# Patient Record
Sex: Male | Born: 1960 | Hispanic: Yes | Marital: Single | Smoking: Never smoker
Health system: Southern US, Community
[De-identification: ages and names within clinical notes are randomized; demographics above are authoritative.]

## PROBLEM LIST (undated history)

## (undated) DIAGNOSIS — I2510 Atherosclerotic heart disease of native coronary artery without angina pectoris: Secondary | ICD-10-CM

## (undated) DIAGNOSIS — E785 Hyperlipidemia, unspecified: Secondary | ICD-10-CM

## (undated) DIAGNOSIS — E119 Type 2 diabetes mellitus without complications: Secondary | ICD-10-CM

## (undated) DIAGNOSIS — I214 Non-ST elevation (NSTEMI) myocardial infarction: Secondary | ICD-10-CM

## (undated) DIAGNOSIS — I429 Cardiomyopathy, unspecified: Secondary | ICD-10-CM

## (undated) HISTORY — PX: FINGER SURGERY: SHX640

---

## 2015-04-11 ENCOUNTER — Encounter (HOSPITAL_COMMUNITY): Payer: Self-pay | Admitting: Emergency Medicine

## 2015-04-11 ENCOUNTER — Emergency Department (HOSPITAL_COMMUNITY)
Admission: EM | Admit: 2015-04-11 | Discharge: 2015-04-12 | Disposition: A | Discharge status: Home / Self Care | Payer: 59 | Attending: Emergency Medicine | Admitting: Emergency Medicine

## 2015-04-11 DIAGNOSIS — Y998 Other external cause status: Secondary | ICD-10-CM | POA: Insufficient documentation

## 2015-04-11 DIAGNOSIS — Y9389 Activity, other specified: Secondary | ICD-10-CM | POA: Insufficient documentation

## 2015-04-11 DIAGNOSIS — Y9289 Other specified places as the place of occurrence of the external cause: Secondary | ICD-10-CM | POA: Insufficient documentation

## 2015-04-11 DIAGNOSIS — S99922A Unspecified injury of left foot, initial encounter: Secondary | ICD-10-CM | POA: Insufficient documentation

## 2015-04-11 DIAGNOSIS — M5416 Radiculopathy, lumbar region: Secondary | ICD-10-CM | POA: Insufficient documentation

## 2015-04-11 DIAGNOSIS — W231XXA Caught, crushed, jammed, or pinched between stationary objects, initial encounter: Secondary | ICD-10-CM | POA: Insufficient documentation

## 2015-04-11 NOTE — ED Notes (Signed)
Pt states that he has had L foot pain and numbness x 3 weeks. Before that he reports that he strained his lower back lifting heavy boxes. Alert and oriented.

## 2015-04-12 ENCOUNTER — Emergency Department (HOSPITAL_COMMUNITY): Payer: 59

## 2015-04-12 MED ORDER — TRAMADOL HCL 50 MG PO TABS: 50.0000 mg | ORAL_TABLET | Freq: Four times a day (QID) | ORAL | Status: DC | PRN

## 2015-04-12 MED ORDER — HYDROCODONE-ACETAMINOPHEN 5-325 MG PO TABS
1.0000 | ORAL_TABLET | Freq: Once | ORAL | Status: AC
  Administered 2015-04-12: 1 via ORAL
  Filled 2015-04-12: qty 1

## 2015-04-12 MED ORDER — PREDNISONE 20 MG PO TABS: 20.0000 mg | ORAL_TABLET | Freq: Two times a day (BID) | ORAL | Status: DC

## 2015-04-12 NOTE — ED Notes (Signed)
Patient transported to X-ray 

## 2015-04-12 NOTE — Discharge Instructions (Signed)
Radiculopata lumbosacra (Lumbosacral Radiculopathy) La radiculopata lumbosacra es una afeccin que compromete los nervios raqudeos, y las races nerviosas de la parte baja de la espalda y Research scientist (life sciences). La afeccin se manifiesta cuando estos nervios y las races nerviosas se desplazan o se inflaman y causan sntomas. CAUSAS Este trastorno puede ser causado por:  Presin en un disco que sobresale de su lugar (hernia de disco). Un disco es una placa de cartlago que separa los huesos de la columna.  Degeneracin discal.  Un estrechamiento de los huesos de la parte baja de la espalda (estenosis del conducto raqudeo).  Un tumor.  Una infeccin.  Una lesin que ejerce una presin repentina en los discos que amortiguan los huesos de la parte ms baja de la columna. FACTORES DE RIESGO Es ms probable que esta afeccin se manifieste en:  Los hombres que [employer] 1 y [id number].  Las mujeres que tienen entre 35 y [id number].  Las personas que levantan cargas de forma inadecuada.  Las personas con sobrepeso o que llevan un estilo de vida sedentario.  Los fumadores.  Las personas que realizan actividades repetitivas que tensionan la columna vertebral. SNTOMAS Los sntomas de esta afeccin incluyen lo siguiente:  Dolor que baja por la espalda hasta las piernas (citica). Este es el sntoma ms frecuente. El dolor puede empeorar al sentarse, toser o estornudar.  Dolor y Science writer los brazos y la piernas.  Debilidad muscular.  Hormigueo.  Prdida del control del intestino o de la vejiga. DIAGNSTICO Esta afeccin se diagnostica mediante un examen fsico y la historia clnica. Si el dolor es prolongado, pueden hacerle estudios, por ejemplo:  Health visitor.  Radiografas.  Tomografa computarizada.  Mielografa.  Estudio de [employer]. TRATAMIENTO Esta afeccin suele tratarse con lo siguiente:  Aplicacin de compresas calientes y fras en las zonas  afectadas.  Estiramientos para mejorar la flexibilidad.  Ejercicios para fortalecer los msculos de la espalda.  Fisioterapia.  Analgsicos.  Una inyeccin de corticoides en la columna vertebral. En algunos los casos, no se necesita tratamiento. Si la afeccin es de larga duracin (crnica) o si los sntomas son intensos, el tratamiento puede incluir ciruga o cambios en el estilo de vida, como seguir un plan para bajar de peso. Sherrill los medicamentos solamente como se lo haya indicado el mdico.  No conduzca ni opere maquinaria pesada mientras toma analgsicos. Cuidado de la lesin  Aplique una compresa caliente en la zona lesionada como le haya indicado el mdico.  Aplique hielo en la zona afectada:  Ponga el hielo en una bolsa plstica.  Coloque una toalla entre la piel y la bolsa de hielo.  Deje el hielo durante 20 a 2minutos, cada 2horas, mientras est despierto o cuando lo necesite. O bien, djelo durante todo el [employer] le haya indicado el mdico. Otras indicaciones  Si le explicaron cmo hacer ejercicios o estiramientos, hgalos como se lo haya indicado el mdico.  Si el mdico le recomend una dieta o un programa de ejercicios, cmplalos como se lo hayan indicado.  Concurra a todas las visitas de control como se lo haya indicado el mdico. Esto es importante. SOLICITE ATENCIN MDICA SI:  El dolor no mejora con el tiempo, incluso si est tomando analgsicos. SOLICITE ATENCIN MDICA DE INMEDIATO SI:  Siente un dolor intenso.  El dolor empeora repentinamente.  Aumenta la debilidad en las piernas.  Pierde la capacidad de controlar la vejiga o el intestino.  Tiene dificultad para caminar o mantener el equilibrio.  Tiene fiebre.   Esta informacin no tiene Marine scientist el consejo del mdico. Asegrese de hacerle al mdico cualquier pregunta que tenga.   Document Released: 01/02/2005  Document Revised: 08/09/2014 Elsevier Interactive Patient Education [employer].

## 2015-04-12 NOTE — ED Notes (Signed)
Discharge instructions, follow up care, and rx x2 reviewed with patient. Patient verbalized understanding. 

## 2015-04-12 NOTE — ED Provider Notes (Addendum)
CSN: [id number]     Arrival date & time 04/11/15  2145 History  By signing my name below, I, Altamease Oiler, attest that this documentation has been prepared under the direction and in the presence of Tanna Furry, MD. Electronically Signed: Altamease Oiler, ED Scribe. 04/12/2015. 2:05 AM   Chief Complaint  Patient presents with  . Foot Pain    The history is provided by the patient. No language interpreter was used.   Kurt Mathis is a 55 y.o. male who presents to the Emergency Department complaining of constant numbness and burning pain at the medial aspect of the left foot with onset 3 weeks ago after he slipped and caught himself with the left leg suspended in air. 1 week prior to that incident he injured his back lifting boxes. The 7/10 in severity foot pain is worse with bearing weight but he denies weakness in the LLE. The back pain is improved with wearing a back brace. Tylenol provided insufficient pain relief at home.  Pt denies new right leg pain, incontinence of bladder, and difficulty urinating.   History reviewed. No pertinent past medical history. No past surgical history on file. History reviewed. No pertinent family history. Social History  Substance Use Topics  . Smoking status: None  . Smokeless tobacco: None  . Alcohol Use: None    Review of Systems  Constitutional: Negative for fever, chills, diaphoresis, appetite change and fatigue.  HENT: Negative for mouth sores, sore throat and trouble swallowing.   Eyes: Negative for visual disturbance.  Respiratory: Negative for cough, chest tightness, shortness of breath and wheezing.   Cardiovascular: Negative for chest pain.  Gastrointestinal: Negative for nausea, vomiting, abdominal pain, diarrhea and abdominal distention.  Endocrine: Negative for polydipsia, polyphagia and polyuria.  Genitourinary: Negative for dysuria, frequency, hematuria and difficulty urinating.  Musculoskeletal: Positive for back pain.  Negative for gait problem.       Left foot pain  Skin: Negative for color change, pallor and rash.  Neurological: Positive for numbness (left foot). Negative for dizziness, syncope, light-headedness and headaches.  Hematological: Does not bruise/bleed easily.  Psychiatric/Behavioral: Negative for behavioral problems and confusion.      Allergies  Review of patient's allergies indicates no known allergies.  Home Medications   Prior to Admission medications   Medication Sig Start Date End Date Taking? Authorizing Provider  predniSONE (DELTASONE) 20 MG tablet Take 1 tablet (20 mg total) by mouth 2 (two) times daily with a meal. 04/12/15   Tanna Furry, MD  traMADol (ULTRAM) 50 MG tablet Take 1 tablet (50 mg total) by mouth every 6 (six) hours as needed. 04/12/15   Tanna Furry, MD   BP 180/94 mmHg  Pulse 105  Temp(Src) 97.8 F (36.6 C) (Oral)  Resp 18  SpO2 94% Physical Exam  Constitutional: He is oriented to person, place, and time. He appears well-developed and well-nourished. No distress.  HENT:  Head: Normocephalic.  Eyes: Conjunctivae are normal. Pupils are equal, round, and reactive to light. No scleral icterus.  Neck: Normal range of motion. Neck supple. No thyromegaly present.  Cardiovascular: Normal rate and regular rhythm.  Exam reveals no gallop and no friction rub.   No murmur heard. Pulmonary/Chest: Effort normal and breath sounds normal. No respiratory distress. He has no wheezes. He has no rales.  Abdominal: Soft. Bowel sounds are normal. He exhibits no distension. There is no tenderness. There is no rebound.  Musculoskeletal: Normal range of motion.  Neurological: He is alert and oriented to  person, place, and time.  Decreased sesnation medial left foot. Normal reflexes. Describes pain in an L4 distribution.  Decreased sensation on exam. Normal strength.   Skin: Skin is warm and dry. No rash noted.  Psychiatric: He has a normal mood and affect. His behavior is normal.     ED Course  Procedures (including critical care time) DIAGNOSTIC STUDIES: Oxygen Saturation is 94% on RA,  normal by my interpretation.    COORDINATION OF CARE: 12:44 AM Discussed treatment plan with pt at bedside and pt agreed to plan.  Labs Review Labs Reviewed - No data to display  Imaging Review Dg Lumbar Spine Complete  04/12/2015  CLINICAL DATA:  Subacute onset of lower back pain. Pain radiates down the left leg and foot. Initial encounter. EXAM: LUMBAR SPINE - COMPLETE 4+ VIEW COMPARISON:  None. FINDINGS: There is no evidence of fracture or subluxation. Vertebral bodies demonstrate normal height and alignment. Intervertebral disc spaces are preserved. The visualized neural foramina are grossly unremarkable in appearance. The visualized bowel gas pattern is unremarkable in appearance; air and stool are noted within the colon. The sacroiliac joints are within normal limits. IMPRESSION: No evidence of fracture or subluxation along the lumbar spine. If the patient's symptoms persist, MRI could be considered to assess for underlying disc abnormality. Electronically Signed   By: Garald Balding M.D.   On: 04/12/2015 03:07   Dg Foot Complete Left  04/12/2015  CLINICAL DATA:  Subacute onset of lower back pain. Pain radiates down the left leg. Initial encounter. EXAM: LEFT FOOT - COMPLETE 3+ VIEW COMPARISON:  None. FINDINGS: There is no evidence of fracture or dislocation. The joint spaces are preserved. There is no evidence of talar subluxation; the subtalar joint is unremarkable in appearance. Small plantar and posterior calcaneal spurs are seen. No significant soft tissue abnormalities are seen. IMPRESSION: No evidence of fracture or dislocation. Electronically Signed   By: Garald Balding M.D.   On: 04/12/2015 03:06     EKG Interpretation None      MDM   Final diagnoses:  Lumbar radiculopathy    Patient with subjective pain and tingling. However no loss of function. He is able to  heel stand, toe stand, walk with high knees. Normal DTRs. No bowel or bladder changes. Normal lumbar spine film without compression. No foot fracture noted. Plan steroids, pain medication, primary care follow-up if not improving. Return precautions discussed.   I personally performed the services described in this documentation, which was scribed in my presence. The recorded information has been reviewed and is accurate.   Tanna Furry, MD 04/12/15 0335  Tanna Furry, MD 04/12/15 [phone]

## 2015-10-18 ENCOUNTER — Other Ambulatory Visit: Payer: Self-pay | Admitting: Specialist

## 2015-10-18 DIAGNOSIS — M549 Dorsalgia, unspecified: Secondary | ICD-10-CM

## 2015-11-06 ENCOUNTER — Inpatient Hospital Stay: Admission: RE | Admit: 2015-11-06 | Payer: Self-pay | Source: Ambulatory Visit

## 2015-11-20 ENCOUNTER — Ambulatory Visit
Admission: RE | Admit: 2015-11-20 | Discharge: 2015-11-20 | Disposition: A | Discharge status: Home / Self Care | Payer: Self-pay | Source: Ambulatory Visit | Attending: Specialist | Admitting: Specialist

## 2015-11-20 DIAGNOSIS — M549 Dorsalgia, unspecified: Secondary | ICD-10-CM

## 2015-11-20 MED ORDER — GADOBENATE DIMEGLUMINE 529 MG/ML IV SOLN
17.0000 mL | Freq: Once | INTRAVENOUS | Status: AC | PRN
  Administered 2015-11-20: 17 mL via INTRAVENOUS

## 2015-12-23 ENCOUNTER — Inpatient Hospital Stay (HOSPITAL_COMMUNITY)
Admission: EM | Admit: 2015-12-23 | Discharge: 2016-01-05 | DRG: 233 | Disposition: A | Discharge status: Home / Self Care | Payer: Self-pay | Attending: Thoracic Surgery (Cardiothoracic Vascular Surgery) | Admitting: Thoracic Surgery (Cardiothoracic Vascular Surgery)

## 2015-12-23 ENCOUNTER — Emergency Department (HOSPITAL_COMMUNITY): Payer: Self-pay

## 2015-12-23 ENCOUNTER — Encounter (HOSPITAL_COMMUNITY): Payer: Self-pay | Admitting: Emergency Medicine

## 2015-12-23 DIAGNOSIS — G8929 Other chronic pain: Secondary | ICD-10-CM | POA: Diagnosis present

## 2015-12-23 DIAGNOSIS — E118 Type 2 diabetes mellitus with unspecified complications: Secondary | ICD-10-CM

## 2015-12-23 DIAGNOSIS — Z951 Presence of aortocoronary bypass graft: Secondary | ICD-10-CM

## 2015-12-23 DIAGNOSIS — I10 Essential (primary) hypertension: Secondary | ICD-10-CM

## 2015-12-23 DIAGNOSIS — J9811 Atelectasis: Secondary | ICD-10-CM | POA: Diagnosis not present

## 2015-12-23 DIAGNOSIS — I319 Disease of pericardium, unspecified: Secondary | ICD-10-CM | POA: Diagnosis present

## 2015-12-23 DIAGNOSIS — Z833 Family history of diabetes mellitus: Secondary | ICD-10-CM

## 2015-12-23 DIAGNOSIS — E1165 Type 2 diabetes mellitus with hyperglycemia: Secondary | ICD-10-CM | POA: Insufficient documentation

## 2015-12-23 DIAGNOSIS — F419 Anxiety disorder, unspecified: Secondary | ICD-10-CM | POA: Diagnosis present

## 2015-12-23 DIAGNOSIS — I25110 Atherosclerotic heart disease of native coronary artery with unstable angina pectoris: Secondary | ICD-10-CM | POA: Diagnosis present

## 2015-12-23 DIAGNOSIS — I5041 Acute combined systolic (congestive) and diastolic (congestive) heart failure: Secondary | ICD-10-CM | POA: Diagnosis present

## 2015-12-23 DIAGNOSIS — I110 Hypertensive heart disease with heart failure: Secondary | ICD-10-CM | POA: Diagnosis present

## 2015-12-23 DIAGNOSIS — IMO0002 Reserved for concepts with insufficient information to code with codable children: Secondary | ICD-10-CM | POA: Diagnosis present

## 2015-12-23 DIAGNOSIS — D62 Acute posthemorrhagic anemia: Secondary | ICD-10-CM | POA: Diagnosis not present

## 2015-12-23 DIAGNOSIS — I2510 Atherosclerotic heart disease of native coronary artery without angina pectoris: Secondary | ICD-10-CM

## 2015-12-23 DIAGNOSIS — E119 Type 2 diabetes mellitus without complications: Secondary | ICD-10-CM | POA: Insufficient documentation

## 2015-12-23 DIAGNOSIS — I252 Old myocardial infarction: Secondary | ICD-10-CM

## 2015-12-23 DIAGNOSIS — Z7722 Contact with and (suspected) exposure to environmental tobacco smoke (acute) (chronic): Secondary | ICD-10-CM | POA: Diagnosis present

## 2015-12-23 DIAGNOSIS — I214 Non-ST elevation (NSTEMI) myocardial infarction: Principal | ICD-10-CM | POA: Diagnosis present

## 2015-12-23 DIAGNOSIS — R0601 Orthopnea: Secondary | ICD-10-CM

## 2015-12-23 DIAGNOSIS — R1310 Dysphagia, unspecified: Secondary | ICD-10-CM | POA: Diagnosis present

## 2015-12-23 DIAGNOSIS — I255 Ischemic cardiomyopathy: Secondary | ICD-10-CM

## 2015-12-23 DIAGNOSIS — E785 Hyperlipidemia, unspecified: Secondary | ICD-10-CM

## 2015-12-23 DIAGNOSIS — M5126 Other intervertebral disc displacement, lumbar region: Secondary | ICD-10-CM | POA: Diagnosis present

## 2015-12-23 HISTORY — DX: Atherosclerotic heart disease of native coronary artery without angina pectoris: I25.10

## 2015-12-23 HISTORY — DX: Type 2 diabetes mellitus without complications: E11.9

## 2015-12-23 HISTORY — DX: Cardiomyopathy, unspecified: I42.9

## 2015-12-23 HISTORY — DX: Non-ST elevation (NSTEMI) myocardial infarction: I21.4

## 2015-12-23 HISTORY — DX: Hyperlipidemia, unspecified: E78.5

## 2015-12-23 LAB — CBC
HCT: 44.5 % (ref 39.0–52.0)
HEMOGLOBIN: 15.4 g/dL (ref 13.0–17.0)
MCH: 30.6 pg (ref 26.0–34.0)
MCHC: 34.6 g/dL (ref 30.0–36.0)
MCV: 88.5 fL (ref 78.0–100.0)
PLATELETS: 288 10*3/uL (ref 150–400)
RBC: 5.03 MIL/uL (ref 4.22–5.81)
RDW: 12.9 % (ref 11.5–15.5)
WBC: 7.8 10*3/uL (ref 4.0–10.5)

## 2015-12-23 LAB — COMPREHENSIVE METABOLIC PANEL WITH GFR
ALT: 40 U/L (ref 17–63)
AST: 48 U/L — ABNORMAL HIGH (ref 15–41)
Albumin: 3.9 g/dL (ref 3.5–5.0)
Alkaline Phosphatase: 74 U/L (ref 38–126)
Anion gap: 10 (ref 5–15)
BUN: 13 mg/dL (ref 6–20)
CO2: 25 mmol/L (ref 22–32)
Calcium: 8.8 mg/dL — ABNORMAL LOW (ref 8.9–10.3)
Chloride: 103 mmol/L (ref 101–111)
Creatinine, Ser: 0.64 mg/dL (ref 0.61–1.24)
GFR calc Af Amer: >60 mL/min
GFR calc non Af Amer: >60 mL/min
Glucose, Bld: 290 mg/dL — ABNORMAL HIGH (ref 65–99)
Potassium: 3.9 mmol/L (ref 3.5–5.1)
Sodium: 138 mmol/L (ref 135–145)
Total Bilirubin: 0.7 mg/dL (ref 0.3–1.2)
Total Protein: 7.2 g/dL (ref 6.5–8.1)

## 2015-12-23 LAB — BASIC METABOLIC PANEL
ANION GAP: 9 (ref 5–15)
BUN: 13 mg/dL (ref 6–20)
CALCIUM: 9.3 mg/dL (ref 8.9–10.3)
CO2: 26 mmol/L (ref 22–32)
CREATININE: 0.74 mg/dL (ref 0.61–1.24)
Chloride: 101 mmol/L (ref 101–111)
GFR calc non Af Amer: >60 mL/min (ref >60)
Glucose, Bld: 309 mg/dL — ABNORMAL HIGH (ref 65–99)
Potassium: 4 mmol/L (ref 3.5–5.1)
SODIUM: 136 mmol/L (ref 135–145)

## 2015-12-23 LAB — I-STAT TROPONIN, ED: TROPONIN I, POC: 6.24 ng/mL — AB (ref 0.00–0.08)

## 2015-12-23 LAB — GLUCOSE, CAPILLARY
Glucose-Capillary: 200 mg/dL — ABNORMAL HIGH (ref 65–99)
Glucose-Capillary: 243 mg/dL — ABNORMAL HIGH (ref 65–99)

## 2015-12-23 LAB — TROPONIN I: TROPONIN I: 5.93 ng/mL — AB (ref <0.03)

## 2015-12-23 LAB — MRSA PCR SCREENING: MRSA by PCR: NEGATIVE

## 2015-12-23 LAB — HEPARIN LEVEL (UNFRACTIONATED): Heparin Unfractionated: <0.1 [IU]/mL — ABNORMAL LOW (ref 0.30–0.70)

## 2015-12-23 LAB — PROTIME-INR
INR: 1.07
Prothrombin Time: 13.9 s (ref 11.4–15.2)

## 2015-12-23 LAB — TSH: TSH: 1.55 u[IU]/mL (ref 0.350–4.500)

## 2015-12-23 LAB — MAGNESIUM: Magnesium: 1.8 mg/dL (ref 1.7–2.4)

## 2015-12-23 MED ORDER — INSULIN ASPART 100 UNIT/ML ~~LOC~~ SOLN
0.0000 [IU] | Freq: Three times a day (TID) | SUBCUTANEOUS | Status: DC
  Administered 2015-12-23: 3 [IU] via SUBCUTANEOUS
  Administered 2015-12-24: 5 [IU] via SUBCUTANEOUS

## 2015-12-23 MED ORDER — HEPARIN BOLUS VIA INFUSION
4000.0000 [IU] | Freq: Once | INTRAVENOUS | Status: AC
  Administered 2015-12-23: 4000 [IU] via INTRAVENOUS
  Filled 2015-12-23: qty 4000

## 2015-12-23 MED ORDER — ACETAMINOPHEN 325 MG PO TABS: 650.0000 mg | ORAL_TABLET | ORAL | Status: DC | PRN

## 2015-12-23 MED ORDER — ASPIRIN 81 MG PO CHEW
324.0000 mg | CHEWABLE_TABLET | Freq: Once | ORAL | Status: AC
  Administered 2015-12-23: 324 mg via ORAL

## 2015-12-23 MED ORDER — ASPIRIN 81 MG PO CHEW
CHEWABLE_TABLET | ORAL | Status: AC
  Administered 2015-12-23: 324 mg via ORAL
  Filled 2015-12-23: qty 4

## 2015-12-23 MED ORDER — ASPIRIN EC 325 MG PO TBEC
325.0000 mg | DELAYED_RELEASE_TABLET | Freq: Once | ORAL | Status: DC
  Filled 2015-12-23: qty 1

## 2015-12-23 MED ORDER — ONDANSETRON HCL 4 MG/2ML IJ SOLN: 4.0000 mg | Freq: Four times a day (QID) | INTRAMUSCULAR | Status: DC | PRN

## 2015-12-23 MED ORDER — HEPARIN (PORCINE) IN NACL 100-0.45 UNIT/ML-% IJ SOLN
900.0000 [IU]/h | INTRAMUSCULAR | Status: DC
  Administered 2015-12-23: 900 [IU]/h via INTRAVENOUS
  Filled 2015-12-23: qty 250

## 2015-12-23 MED ORDER — INSULIN ASPART 100 UNIT/ML ~~LOC~~ SOLN
0.0000 [IU] | Freq: Every day | SUBCUTANEOUS | Status: DC
  Administered 2015-12-23: 2 [IU] via SUBCUTANEOUS

## 2015-12-23 MED ORDER — METOPROLOL TARTRATE 25 MG PO TABS
25.0000 mg | ORAL_TABLET | Freq: Two times a day (BID) | ORAL | Status: DC
  Administered 2015-12-23 – 2015-12-31 (×18): 25 mg via ORAL
  Filled 2015-12-23 (×18): qty 1

## 2015-12-23 MED ORDER — INSULIN ASPART 100 UNIT/ML ~~LOC~~ SOLN: 0.0000 [IU] | Freq: Three times a day (TID) | SUBCUTANEOUS | Status: DC

## 2015-12-23 MED ORDER — HEPARIN (PORCINE) IN NACL 100-0.45 UNIT/ML-% IJ SOLN
1300.0000 [IU]/h | INTRAMUSCULAR | Status: DC
  Administered 2015-12-23: 1150 [IU]/h via INTRAVENOUS
  Administered 2015-12-24: 1300 [IU]/h via INTRAVENOUS
  Filled 2015-12-23 (×2): qty 250

## 2015-12-23 MED ORDER — SODIUM CHLORIDE 0.9 % IV BOLUS (SEPSIS): 1000.0000 mL | Freq: Once | INTRAVENOUS | Status: DC

## 2015-12-23 MED ORDER — ATORVASTATIN CALCIUM 80 MG PO TABS
80.0000 mg | ORAL_TABLET | Freq: Every day | ORAL | Status: DC
  Administered 2015-12-23 – 2015-12-24 (×2): 80 mg via ORAL
  Filled 2015-12-23: qty 1
  Filled 2015-12-23: qty 2

## 2015-12-23 MED ORDER — ASPIRIN 300 MG RE SUPP: 300.0000 mg | RECTAL | Status: AC

## 2015-12-23 MED ORDER — ASPIRIN EC 81 MG PO TBEC
81.0000 mg | DELAYED_RELEASE_TABLET | Freq: Every day | ORAL | Status: DC
  Administered 2015-12-24 – 2015-12-31 (×7): 81 mg via ORAL
  Filled 2015-12-23 (×7): qty 1

## 2015-12-23 MED ORDER — ASPIRIN 81 MG PO CHEW: 324.0000 mg | CHEWABLE_TABLET | ORAL | Status: AC

## 2015-12-23 MED ORDER — HEPARIN BOLUS VIA INFUSION
2000.0000 [IU] | Freq: Once | INTRAVENOUS | Status: AC
  Administered 2015-12-23: 2000 [IU] via INTRAVENOUS
  Filled 2015-12-23: qty 2000

## 2015-12-23 MED ORDER — FENTANYL CITRATE (PF) 100 MCG/2ML IJ SOLN: 50.0000 ug | Freq: Once | INTRAMUSCULAR | Status: DC

## 2015-12-23 MED ORDER — NITROGLYCERIN 0.4 MG SL SUBL
0.4000 mg | SUBLINGUAL_TABLET | SUBLINGUAL | Status: DC | PRN
  Administered 2015-12-23 (×3): 0.4 mg via SUBLINGUAL
  Filled 2015-12-23: qty 1

## 2015-12-23 MED ORDER — GI COCKTAIL ~~LOC~~
30.0000 mL | Freq: Once | ORAL | Status: AC
  Administered 2015-12-23: 30 mL via ORAL
  Filled 2015-12-23: qty 30

## 2015-12-23 MED ORDER — MORPHINE SULFATE (PF) 4 MG/ML IV SOLN
4.0000 mg | Freq: Once | INTRAVENOUS | Status: AC
Start: 2015-12-23 — End: 2015-12-23
  Administered 2015-12-23: 4 mg via INTRAVENOUS
  Filled 2015-12-23: qty 1

## 2015-12-23 NOTE — ED Notes (Signed)
Attempted report 

## 2015-12-23 NOTE — ED Triage Notes (Signed)
Pt c/o pain to chest with palpitations whenever cold air touches his chest x 4 days, pain to throat whenever he drinks something cold. Uncle died suddenly of cardiac disease at age 55 after feeling same symptoms of chest pain in cold air.

## 2015-12-23 NOTE — [facility] Notes (Signed)
ANTICOAGULATION CONSULT NOTE - Pharmacy Consult for Heparin Indication: chest pain/ACS  No Known Allergies  Patient Measurements: Height: 5\' 10"  (177.8 cm) Weight: 159 lb 9.8 oz (72.4 kg) (72.4kg) IBW/kg (Calculated) : 73 Heparin Dosing Weight: 72.4 kg  Vital Signs: Temp: 98.6 F (37 C) (09/16 1938) Temp Source: Oral (09/16 1938) BP: 147/88 (09/16 2101) Pulse Rate: 88 (09/16 2101)  Labs:  Recent Labs  12/23/15 1100 12/23/15 1200 12/23/15 2058  HGB 15.4  --   --   HCT 44.5  --   --   PLT 288  --   --   LABPROT  --  13.9  --   INR  --  1.07  --   HEPARINUNFRC  --   --  <0.10*  CREATININE 0.74 0.64  --   TROPONINI  --  5.93*  --    Estimated Creatinine Clearance: 106.8 mL/min (by C-G formula based on SCr of 0.64 mg/dL).  Medical History: History reviewed. No pertinent past medical history.  Medications:  Scheduled:  . aspirin  324 mg Oral NOW   Or  . aspirin  300 mg Rectal NOW  . [START ON 12/24/2015] aspirin EC  81 mg Oral Daily  . atorvastatin  80 mg Oral q1800  . insulin aspart  0-15 Units Subcutaneous TID WC  . metoprolol tartrate  25 mg Oral BID   Infusions:  . heparin 900 Units/hr (12/23/15 1938)   Assessment: 52 yoM with chest discomfort x 3 days, family hx of sudden death/cardiac arrest.  Initial Troponin 6.24, Pharmacy consulted for  IV Heparin for ACS/STEMI Initial 6h heparin level is <0.1, subtherapeutic on heparin 900 units/hr.  RN reports IV heparin infusion running without complications.   Goal of Therapy:  Heparin level 0.3-0.7 units/ml Monitor platelets by anticoagulation protocol: Yes   Plan:  Re-bolus heparin 2000 units IV x1 Increase heparin drip rate to 1150 units/hr 6 hr heparin level Daily heparin level and CBC.  Nicole Cella, RPh Clinical Pharmacist Pager: [phone] 12/23/2015, 9:47 PM

## 2015-12-23 NOTE — ED Provider Notes (Signed)
Patient was transferred from [facility] long hospital to cardiology.  His diagnosis was in NSTEMI.  See H&P.  Patient pain is much improved but still slightly present.  I did speak with cardiology and patient was to be admitted to stepdown bed.  Orders have been written.  Patient appears stable.  Review of EKG shows no changes since previous EKG of earlier today.   Leonard Schwartz, MD 12/23/15 [phone]

## 2015-12-23 NOTE — [facility] Notes (Signed)
Discussed the patient with Dr. Thurnell Garbe. Sounds like a completed NSTEMI (symptoms >12 hours ago) with some residual post infarction angina. No ST elevation or other high risk ECG features. Troponin I > 6. Requested a stepdown bed at [facility] and will see when he gets here.  Started on ASA and heparin, will give statin and beta blocker. Probably for coronary cath on Monday, sooner if he develops unstable symptoms or high risk ECG changes.

## 2015-12-23 NOTE — [facility] Notes (Signed)
ANTICOAGULATION CONSULT NOTE - Initial Consult  Pharmacy Consult for Heparin Indication: chest pain/ACS  No Known Allergies  Patient Measurements: Weight: 168 lb (76.2 kg) Heparin Dosing Weight: 76.2  Vital Signs: Temp: 97.7 F (36.5 C) (09/16 1010) Temp Source: Oral (09/16 1010) BP: 175/102 (09/16 1115) Pulse Rate: 80 (09/16 1115)  Labs:  Recent Labs  12/23/15 1100  HGB 15.4  HCT 44.5  PLT 288  CREATININE 0.74   CrCl cannot be calculated (Unknown ideal weight.).  Medical History: History reviewed. No pertinent past medical history.  Medications:  Scheduled:  . aspirin  324 mg Oral NOW   Or  . aspirin  300 mg Rectal NOW  . [START ON 12/24/2015] aspirin EC  81 mg Oral Daily  . atorvastatin  80 mg Oral q1800  . metoprolol tartrate  25 mg Oral BID   Infusions:  . heparin 900 Units/hr (12/23/15 1209)   Assessment: 55 yoM with chest discomfort x 3 days, family hx of sudden death/cardiac arrest.  Initial Troponin 6.24, begin IV Heparin for ACS/STEMI EKG borderline ST elevation, plan transfer to [facility]  Goal of Therapy:  Heparin level 0.3-0.7 units/ml Monitor platelets by anticoagulation protocol: Yes   Plan:   Begin IV Heparin, 4000 unit bolus, 900 units/hr  Heparin level ordered 18:30, bolus charted at 12:09  1st level ordered 18:30, CBC ordered daily  Plan transfer to [place], main Pharmacy notified  Minda Ditto PharmD Pager [phone] 12/23/2015, 12:29 PM

## 2015-12-23 NOTE — H&P (Signed)
Chief Complaint:  Unstable angina  HPI:  This is a 55 y.o. male with a past medical history significant for the absence of serious medical problems presents with roughly 5 day history of unstable angina. Starting on Wednesday he noticed that he would experience chest pressure radiating to his throat and jaw,  whenever he would be in contact with cold air or drink liquids. The symptoms occurred off and on for the next several days. Friday night around 8:30 the symptoms became more severe and incessant, without relief. He could not find a position which was comfortable and it seems that he was describing some degree of orthopnea.   While in the emergency room at [facility] he still had some mild chest discomfort and was administered nitroglycerin. He reports that he is now completely asymptomatic. He is on intravenous heparin.   His electrocardiogram does not show high risk findings. There are subtle T-wave changes primarily in leads V5-V6. No Q waves are seen. No significant ST segment deviation is seen. Cardiac enzymes are abnormal with a troponin of around 6.  He denies palpitations, syncope, leg edema, intermittent claudication, cough, hemoptysis, fever or chills, abdominal pain, change in bowel pattern, major weight changes, intolerance to heat or cold, hot flashes, diaphoresis, polyuria, polydipsia, polyphagia.  Nonfasting blood sugar 290. He denies a history of diabetes.  PMHx:  History reviewed. No pertinent past medical history. He has a variety of musculoskeletal problems, Marilee low back problems (most severe L4-L5 herniated disc). and takes muscle relaxants, opiate analgesics and NSAIDs. Note that he is receiving gabapentin, presumably for chronic pain.   History reviewed. No pertinent surgical history.  FAMHx:  History reviewed. His uncle had sudden onset of chest discomfort and died shortly after arriving at the hospital at age 90-65 or so.  SOCHx:  Originally from [place]. Married with one son and one stepdaughter . 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 Secondhand smoke exposure for the last 10 years from his wife. Occasional alcohol use, no history of drug use.  ALLERGIES:  No Known Allergies  ROS: Pertinent items noted in HPI and remainder of comprehensive ROS otherwise negative.  HOME MEDS: No current facility-administered medications on file prior to encounter.    Current Outpatient Prescriptions on File Prior to Encounter  Medication Sig Dispense Refill  . traMADol (ULTRAM) 50 MG tablet Take 1 tablet (50 mg total) by mouth every 6 (six) hours as needed. (Patient not taking: Reported on 12/23/2015) 15 tablet 0    LABS/IMAGING: Results for orders placed or performed during the hospital encounter of 12/23/15 (from the past 48 hour(s))  Basic metabolic panel     Status: Abnormal   Collection Time: 12/23/15 11:00 AM  Result Value Ref Range   Sodium 136 135 - 145 mmol/L   Potassium 4.0 3.5 - 5.1 mmol/L   Chloride 101 101 - 111 mmol/L   CO2 26 22 - 32 mmol/L   Glucose, Bld 309 (H) 65 - 99 mg/dL   BUN 13 6 - 20 mg/dL   Creatinine, Ser 0.74 0.61 - 1.24 mg/dL   Calcium 9.3 8.9 - 10.3 mg/dL   GFR calc non Af Amer >60 >60 mL/min   GFR calc Af Amer >60 >60 mL/min    Comment: (NOTE) The eGFR has been calculated using the CKD EPI equation. This calculation has not been validated in all clinical situations. eGFR's persistently <60 mL/min signify possible Chronic Kidney Disease.    Anion gap 9  5 - 15  CBC     Status: None   Collection Time: 12/23/15 11:00 AM  Result Value Ref Range   WBC 7.8 4.0 - 10.5 K/uL   RBC 5.03 4.22 - 5.81 MIL/uL   Hemoglobin 15.4 13.0 - 17.0 g/dL   HCT 44.5 39.0 - 52.0 %   MCV 88.5 78.0 - 100.0 fL   MCH 30.6 26.0 - 34.0 pg   MCHC 34.6 30.0 - 36.0 g/dL   RDW 12.9 11.5 - 15.5 %   Platelets 288 150 - 400 K/uL  I-stat troponin, ED     Status: Abnormal   Collection Time: 12/23/15 11:06 AM  Result Value Ref Range     Troponin i, poc 6.24 (HH) 0.00 - 0.08 ng/mL   Comment NOTIFIED PHYSICIAN    Comment 3            Comment: Due to the release kinetics of cTnI, a negative result within the first hours of the onset of symptoms does not rule out myocardial infarction with certainty. If myocardial infarction is still suspected, repeat the test at appropriate intervals.   Troponin I     Status: Abnormal   Collection Time: 12/23/15 12:00 PM  Result Value Ref Range   Troponin I 5.93 (HH) <0.03 ng/mL    Comment: CRITICAL RESULT CALLED TO, READ BACK BY AND VERIFIED WITH: DAVIS,N AT 1:10PM ON 12/23/15 BY [facility]   Comprehensive metabolic panel     Status: Abnormal   Collection Time: 12/23/15 12:00 PM  Result Value Ref Range   Sodium 138 135 - 145 mmol/L   Potassium 3.9 3.5 - 5.1 mmol/L   Chloride 103 101 - 111 mmol/L   CO2 25 22 - 32 mmol/L   Glucose, Bld 290 (H) 65 - 99 mg/dL   BUN 13 6 - 20 mg/dL   Creatinine, Ser 0.64 0.61 - 1.24 mg/dL   Calcium 8.8 (L) 8.9 - 10.3 mg/dL   Total Protein 7.2 6.5 - 8.1 g/dL   Albumin 3.9 3.5 - 5.0 g/dL   AST 48 (H) 15 - 41 U/L   ALT 40 17 - 63 U/L   Alkaline Phosphatase 74 38 - 126 U/L   Total Bilirubin 0.7 0.3 - 1.2 mg/dL   GFR calc non Af Amer >60 >60 mL/min   GFR calc Af Amer >60 >60 mL/min    Comment: (NOTE) The eGFR has been calculated using the CKD EPI equation. This calculation has not been validated in all clinical situations. eGFR's persistently <60 mL/min signify possible Chronic Kidney Disease.    Anion gap 10 5 - 15  Magnesium     Status: None   Collection Time: 12/23/15 12:00 PM  Result Value Ref Range   Magnesium 1.8 1.7 - 2.4 mg/dL  TSH     Status: None   Collection Time: 12/23/15 12:00 PM  Result Value Ref Range   TSH 1.550 0.350 - 4.500 uIU/mL  Protime-INR     Status: None   Collection Time: 12/23/15 12:00 PM  Result Value Ref Range   Prothrombin Time 13.9 11.4 - 15.2 seconds   INR 1.07    Dg Chest 2 View  Result Date:  12/23/2015 CLINICAL DATA:  Tachycardia and chest pain for 3 days. EXAM: CHEST  2 VIEW COMPARISON:  None. FINDINGS: The cardiomediastinal silhouette is unremarkable. There is no evidence of focal airspace disease, pulmonary edema, suspicious pulmonary nodule/mass, pleural effusion, or pneumothorax. No acute bony abnormalities are identified. IMPRESSION: No active cardiopulmonary disease.  Electronically Signed   By: Margarette Canada M.D.   On: 12/23/2015 11:50    VITALS: Blood pressure 145/79, pulse 73, temperature 97.5 F (36.4 C), temperature source Oral, resp. rate 20, weight 76.2 kg (168 lb), SpO2 99 %.  EXAM:  General: Alert, oriented x3, no distress Head: no evidence of trauma, PERRL, EOMI, no exophtalmos or lid lag, no myxedema, no xanthelasma; normal ears, nose and oropharynx Neck: normal jugular venous pulsations and no hepatojugular reflux; brisk carotid pulses without delay and no carotid bruits Chest: clear to auscultation, no signs of consolidation by percussion or palpation, normal fremitus, symmetrical and full respiratory excursions Cardiovascular: normal position and quality of the apical impulse, regular rhythm, normal first heart sound and normal second heart sounds, no rubs or murmur, S4 gallop is present Abdomen: no tenderness or distention, no masses by palpation, no abnormal pulsatility or arterial bruits, normal bowel sounds, no hepatosplenomegaly Extremities: no clubbing, cyanosis or edema; 2+ radial, ulnar and brachial pulses bilaterally; 2+ right femoral, posterior tibial and dorsalis pedis pulses; 2+ left femoral, posterior tibial and dorsalis pedis pulses; no subclavian or femoral bruits Neurological: grossly nonfocal   IMPRESSION:  1.Small non-STEMI, with minimal ECG changes and moderate increase in cardiac troponin I, delayed presentation. Suspect left circumflex artery stenosis due to rather quiet ECG, but could also have a mid or distal LAD abnormality.  2. Transient  symptoms of orthopnea/congestive heart failure suggests that there may be a substantially larger area of myocardium at risk.  3. Severe nonfasting hyperglycemia suggests that he does have diabetes mellitus. Repeat fasting blood sugars and hemoglobin A1c ordered.  PLAN: 1. Continue aspirin and intravenous heparin, start statin and beta blockers. 2. Cardiac catheterization/coronary angiography and revascularization as indicated on Monday. If he develops severe symptoms at rest or accompanying high risk ECG changes, perform urgent angiography over the weekend. 3. Echocardiogram ordered. Start ACE inhibitor, especially if LVEF is less than normal. Hold ACE inhibitor just before coronary angiography. 4. Check fasting blood sugar and hemoglobin A1c. Diabetes education. Dietary recommendations, exercise discussed briefly today. 5. Avoid secondhand smoke exposure. Encouraged his wife to stop smoking.  Sanda Klein, MD, [facility] [phone] office [phone] pager  12/23/2015, 3:11 PM

## 2015-12-23 NOTE — ED Provider Notes (Signed)
Medical screening examination/treatment/procedure(s) were conducted as a shared visit with non-physician practitioner(s) and myself.  I personally evaluated the patient during the encounter.  Per pt: c/o gradual onset and worsening of persistent chest "pain" for the past 3 days, worse since last evening. Pt states 3 days ago he was "walking in the cold and rain" 5 blocks to a restaurant. Pt states he had to stop every block due to having "the cold hit my chest," which radiated into his jaw, and was associated with SOB. Pt states he stopped walking and his symptoms improved after approximately  10 to 20 minutes of resting. This was repeated several more times, until he finally go to [employer]. Pt states he "drank some warm fluids" and his symptoms completely resolved. Pt states his symptoms have continued intermittently for the past 3 days. Pt states his symptoms became constant "after I ate last night" approximately 2030. States he was "up all night" with "cold in my chest," SOB, and diaphoresis. Pt states his symptoms "got worse when the air conditioner when on" and somewhat improved when he "wrapped a scarf around me and touched my chest." EKG without acute ST elevation, troponin elevated. Pt with mild CP while in the ED. ASA, SL ntg and IV heparin started. Pt remains A&O, NAD, non-toxic appearing, resps easy, RRR, abd soft/NT, neuro non-focal. T/C to Cards Dr. Loletha Grayer: he will place orders for [facility] Stepdown bed; requests that if pt was going to hold for a bed at [facility], please transfer to [facility] ED. Dirk Dress ED Charge RN aware and report given to [facility] ED Charge RN. EDP Pod B/D also given report.    MDM Reviewed: nursing note and vitals Interpretation: ECG, labs and x-ray Total time providing critical care: 30-74 minutes. This excludes time spent performing separately reportable procedures and services. Consults: cardiology   CRITICAL CARE Performed by: Alfonzo Feller Total critical care time: 35  minutes Critical care time was exclusive of separately billable procedures and treating other patients. Critical care was necessary to treat or prevent imminent or life-threatening deterioration. Critical care was time spent personally by me on the following activities: development of treatment plan with patient and/or surrogate as well as nursing, discussions with consultants, evaluation of patient's response to treatment, examination of patient, obtaining history from patient or surrogate, ordering and performing treatments and interventions, ordering and review of laboratory studies, ordering and review of radiographic studies, pulse oximetry and re-evaluation of patient's condition.    EKG Interpretation  Date/Time:  Saturday December 23 2015 10:09:06 EDT Ventricular Rate:  97 PR Interval:    QRS Duration: 106 QT Interval:  373 QTC Calculation: 474 R Axis:   135 Text Interpretation:  Sinus rhythm Right axis deviation Borderline T abnormalities, lateral leads Artifact No old tracing to compare Confirmed by [facility]  MD, Nunzio Cory [phone]) on 12/23/2015 10:30:38 AM        EKG Interpretation  Date/Time:  Saturday December 23 2015 11:49:12 EDT Ventricular Rate:  89 PR Interval:    QRS Duration: 106 QT Interval:  387 QTC Calculation: 471 R Axis:   69 Text Interpretation:  Sinus rhythm Consider right atrial enlargement Nonspecific T abnormalities, lateral leads Borderline ST elevation, anterior leads since last tracing no significant change Confirmed by [facility]  MD, Nunzio Cory [phone]) on 12/23/2015 12:14:51 PM      Results for orders placed or performed during the hospital encounter of [id number]  Basic metabolic panel  Result Value Ref Range   Sodium 136 135 - 145  mmol/L   Potassium 4.0 3.5 - 5.1 mmol/L   Chloride 101 101 - 111 mmol/L   CO2 26 22 - 32 mmol/L   Glucose, Bld 309 (H) 65 - 99 mg/dL   BUN 13 6 - 20 mg/dL   Creatinine, Ser 0.74 0.61 - 1.24 mg/dL   Calcium 9.3 8.9 - 10.3  mg/dL   GFR calc non Af Amer >60 >60 mL/min   GFR calc Af Amer >60 >60 mL/min   Anion gap 9 5 - 15  CBC  Result Value Ref Range   WBC 7.8 4.0 - 10.5 K/uL   RBC 5.03 4.22 - 5.81 MIL/uL   Hemoglobin 15.4 13.0 - 17.0 g/dL   HCT 44.5 39.0 - 52.0 %   MCV 88.5 78.0 - 100.0 fL   MCH 30.6 26.0 - 34.0 pg   MCHC 34.6 30.0 - 36.0 g/dL   RDW 12.9 11.5 - 15.5 %   Platelets 288 150 - 400 K/uL  I-stat troponin, ED  Result Value Ref Range   Troponin i, poc 6.24 (HH) 0.00 - 0.08 ng/mL   Comment NOTIFIED PHYSICIAN    Comment 3           Dg Chest 2 View Result Date: 12/23/2015 CLINICAL DATA:  Tachycardia and chest pain for 3 days. EXAM: CHEST  2 VIEW COMPARISON:  None. FINDINGS: The cardiomediastinal silhouette is unremarkable. There is no evidence of focal airspace disease, pulmonary edema, suspicious pulmonary nodule/mass, pleural effusion, or pneumothorax. No acute bony abnormalities are identified. IMPRESSION: No active cardiopulmonary disease. Electronically Signed   By: Margarette Canada M.D.   On: 12/23/2015 11:50      Francine Graven, DO 12/23/15 1226

## 2015-12-23 NOTE — ED Provider Notes (Signed)
[facility] DEPT Provider Note   CSN: [id number] Arrival date & time: 12/23/15  [id number]    History   Chief Complaint Chief Complaint  Patient presents with  . Chest Pain    cold sensitivity    HPI Kurt Mathis is a 55 y.o. male who presents with multiple complaints. No significant PMH. History is limited due to language barrier and he is somewhat of a poor historian. He states he has had throat pain when swallowing cold drinks for the past month but it didn't bother him that much. Since Wednesday he has had chest pain as well as subjective fever, SOB, and diaphoresis. He attributes it to drinking cold drinks however he states he was walking down the block when this occurred. He stopped at a restaurant and the pain went away but returned with exertion. He has been taking tramadol, tylenol, gabapentin, and a muscle relaxer for his leg which he stopped because he was concerned that the meds might be making his symptoms worse. At 8:30 last night he started having continuous chest pain and was unable to sleep so he came to the ED for further evaluation. He reports the chest pain is in the center of his chest and radiates up to his throat. Reports associated palpitations. Reports family hx of cardiac disease but has never been evaluated in the past. Denies personal hx of cardiac disease, tobacco use, HTN, HLD. Denies syncope, cough, abdominal pain, N/V  HPI  History reviewed. No pertinent past medical history.  There are no active problems to display for this patient.   History reviewed. No pertinent surgical history.    Home Medications    Prior to Admission medications   Medication Sig Start Date End Date Taking? Authorizing Provider  predniSONE (DELTASONE) 20 MG tablet Take 1 tablet (20 mg total) by mouth 2 (two) times daily with a meal. 04/12/15   Tanna Furry, MD  traMADol (ULTRAM) 50 MG tablet Take 1 tablet (50 mg total) by mouth every 6 (six) hours as needed. 04/12/15   Tanna Furry, MD    Family History History reviewed. No pertinent family history.  Social History Social History  Substance Use Topics  . Smoking status: Not on file  . Smokeless tobacco: Not on file  . Alcohol use Not on file     Allergies   Review of patient's allergies indicates no known allergies.   Review of Systems Review of Systems  Constitutional: Positive for diaphoresis and fever.  Respiratory: Negative for shortness of breath.   Cardiovascular: Positive for chest pain and palpitations.  Gastrointestinal: Negative for abdominal pain, constipation, diarrhea, nausea and vomiting.     Physical Exam Updated Vital Signs BP (!) 175/112 (BP Location: Left Arm)   Pulse 92   Temp 97.7 F (36.5 C) (Oral)   Resp 16   SpO2 98%   Physical Exam  Constitutional: He is oriented to person, place, and time. He appears well-developed and well-nourished. No distress.  HENT:  Head: Normocephalic and atraumatic.  Right Ear: Hearing, tympanic membrane, external ear and ear canal normal.  Left Ear: Hearing, tympanic membrane, external ear and ear canal normal.  Nose: Nose normal.  Mouth/Throat: Uvula is midline and mucous membranes are normal. No posterior oropharyngeal erythema.  Eyes: Conjunctivae are normal. Pupils are equal, round, and reactive to light. Right eye exhibits no discharge. Left eye exhibits no discharge. No scleral icterus.  Neck: Normal range of motion. Neck supple.  Cardiovascular: Normal rate and regular rhythm.  Exam  reveals no gallop and no friction rub.   No murmur heard. Pulmonary/Chest: Effort normal and breath sounds normal. No respiratory distress. He has no wheezes. He has no rales. He exhibits no tenderness.  Abdominal: Soft. Bowel sounds are normal. He exhibits no distension and no mass. There is no tenderness. There is no rebound and no guarding.  Musculoskeletal: He exhibits no edema.  Neurological: He is alert and oriented to person, place, and time.    Skin: Skin is warm and dry.  Psychiatric: He has a normal mood and affect.  Nursing note and vitals reviewed.    ED Treatments / Results  Labs (all labs ordered are listed, but only abnormal results are displayed) Labs Reviewed  BASIC METABOLIC PANEL - Abnormal; Notable for the following:       Result Value   Glucose, Bld 309 (*)    All other components within normal limits  I-STAT TROPOININ, ED - Abnormal; Notable for the following:    Troponin i, poc 6.24 (*)    All other components within normal limits  CBC  PROTIME-INR  TROPONIN I  COMPREHENSIVE METABOLIC PANEL  MAGNESIUM  TSH  HEMOGLOBIN A1C  TROPONIN I  TROPONIN I  HEPARIN LEVEL (UNFRACTIONATED)    EKG  EKG Interpretation  Date/Time:  Saturday December 23 2015 10:09:06 EDT Ventricular Rate:  97 PR Interval:    QRS Duration: 106 QT Interval:  373 QTC Calculation: 474 R Axis:   135 Text Interpretation:  Sinus rhythm Right axis deviation Borderline T abnormalities, lateral leads Artifact No old tracing to compare Confirmed by [facility]  MD, Nunzio Cory [phone]) on 12/23/2015 10:30:38 AM       Radiology Dg Chest 2 View  Result Date: 12/23/2015 CLINICAL DATA:  Tachycardia and chest pain for 3 days. EXAM: CHEST  2 VIEW COMPARISON:  None. FINDINGS: The cardiomediastinal silhouette is unremarkable. There is no evidence of focal airspace disease, pulmonary edema, suspicious pulmonary nodule/mass, pleural effusion, or pneumothorax. No acute bony abnormalities are identified. IMPRESSION: No active cardiopulmonary disease. Electronically Signed   By: Margarette Canada M.D.   On: 12/23/2015 11:50    Procedures Procedures (including critical care time)  Medications Ordered in ED Medications  nitroGLYCERIN (NITROSTAT) SL tablet 0.4 mg (0.4 mg Sublingual Given 12/23/15 1156)  aspirin chewable tablet 324 mg (324 mg Oral Not Given 12/23/15 1157)    Or  aspirin suppository 300 mg ( Rectal See Alternative 12/23/15 1157)  aspirin EC  tablet 81 mg (not administered)  acetaminophen (TYLENOL) tablet 650 mg (not administered)  ondansetron (ZOFRAN) injection 4 mg (not administered)  metoprolol tartrate (LOPRESSOR) tablet 25 mg (not administered)  atorvastatin (LIPITOR) tablet 80 mg (not administered)  heparin ADULT infusion 100 units/mL (25000 units/219mL sodium chloride 0.45%) (not administered)  heparin bolus via infusion 4,000 Units (not administered)  gi cocktail (Maalox,Lidocaine,Donnatal) (30 mLs Oral Given 12/23/15 1112)  aspirin chewable tablet 324 mg (324 mg Oral Given 12/23/15 1148)     Initial Impression / Assessment and Plan / ED Course  I have reviewed the triage vital signs and the nursing notes.  Pertinent labs & imaging results that were available during my care of the patient were reviewed by me and considered in my medical decision making (see chart for details).  Clinical Course   55 year old male with NSTEMI. He is initially hypertensive here with some improvement. Otherwise vitals are stable and WNL. EKG is SR with borderline T wave abnormalities in lateral leads. I-stat troponin is 6.24. CBC  and BMP are unremarkable. CXR unremarkable.   Spoke with Dr. Sallyanne Kuster who would like formal troponin, ASA, Nitro, and heparin. He would like patient transferred to [facility] for further management. Shared visit with Dr. Mont Dutton. [facility] to transport patient in STABLE condition.  Final Clinical Impressions(s) / ED Diagnoses   Final diagnoses:  NSTEMI (non-ST elevation myocardial infarction) [facility])    New Prescriptions New Prescriptions   No medications on file     Recardo Evangelist, PA-C 12/23/15 [place], DO 12/27/15 1649

## 2015-12-23 NOTE — Progress Notes (Signed)
Notified Md about pt's cbg = 243.  New orders received. Will continue to monitor Kurt Mathis

## 2015-12-24 ENCOUNTER — Inpatient Hospital Stay (HOSPITAL_COMMUNITY): Payer: Self-pay

## 2015-12-24 DIAGNOSIS — I10 Essential (primary) hypertension: Secondary | ICD-10-CM

## 2015-12-24 DIAGNOSIS — E1165 Type 2 diabetes mellitus with hyperglycemia: Secondary | ICD-10-CM | POA: Diagnosis present

## 2015-12-24 DIAGNOSIS — IMO0002 Reserved for concepts with insufficient information to code with codable children: Secondary | ICD-10-CM | POA: Diagnosis present

## 2015-12-24 DIAGNOSIS — E118 Type 2 diabetes mellitus with unspecified complications: Secondary | ICD-10-CM

## 2015-12-24 DIAGNOSIS — R079 Chest pain, unspecified: Secondary | ICD-10-CM

## 2015-12-24 LAB — LIPID PANEL
CHOL/HDL RATIO: 8.6 ratio
CHOLESTEROL: 173 mg/dL (ref 0–200)
CHOLESTEROL: 171 mg/dL (ref 0–200)
HDL: 34 mg/dL — ABNORMAL LOW (ref >40)
HDL: 20 mg/dL — AB (ref >40)
LDL Cholesterol: 84 mg/dL (ref 0–99)
LDL Cholesterol: 99 mg/dL (ref 0–99)
TRIGLYCERIDES: 275 mg/dL — AB (ref <150)
TRIGLYCERIDES: 261 mg/dL — AB (ref <150)
Total CHOL/HDL Ratio: 5.1 RATIO
VLDL: 55 mg/dL — ABNORMAL HIGH (ref 0–40)
VLDL: 52 mg/dL — AB (ref 0–40)

## 2015-12-24 LAB — CBC
HCT: 29.2 % — ABNORMAL LOW (ref 39.0–52.0)
HCT: 42.2 % (ref 39.0–52.0)
HEMOGLOBIN: 14.2 g/dL (ref 13.0–17.0)
Hemoglobin: 8.5 g/dL — ABNORMAL LOW (ref 13.0–17.0)
MCH: 29.4 pg (ref 26.0–34.0)
MCH: 30.3 pg (ref 26.0–34.0)
MCHC: 29.1 g/dL — AB (ref 30.0–36.0)
MCHC: 33.6 g/dL (ref 30.0–36.0)
MCV: 101 fL — ABNORMAL HIGH (ref 78.0–100.0)
MCV: 90.2 fL (ref 78.0–100.0)
PLATELETS: 326 10*3/uL (ref 150–400)
PLATELETS: 289 10*3/uL (ref 150–400)
RBC: 2.89 MIL/uL — ABNORMAL LOW (ref 4.22–5.81)
RBC: 4.68 MIL/uL (ref 4.22–5.81)
RDW: 16.4 % — AB (ref 11.5–15.5)
RDW: 12.9 % (ref 11.5–15.5)
WBC: 7.9 10*3/uL (ref 4.0–10.5)
WBC: 8.7 10*3/uL (ref 4.0–10.5)

## 2015-12-24 LAB — GLUCOSE, CAPILLARY
GLUCOSE-CAPILLARY: 249 mg/dL — AB (ref 65–99)
Glucose-Capillary: 259 mg/dL — ABNORMAL HIGH (ref 65–99)
Glucose-Capillary: 209 mg/dL — ABNORMAL HIGH (ref 65–99)
Glucose-Capillary: 242 mg/dL — ABNORMAL HIGH (ref 65–99)

## 2015-12-24 LAB — HEPARIN LEVEL (UNFRACTIONATED)
HEPARIN UNFRACTIONATED: 0.33 [IU]/mL (ref 0.30–0.70)
Heparin Unfractionated: 0.1 IU/mL — ABNORMAL LOW (ref 0.30–0.70)
Heparin Unfractionated: 0.34 IU/mL (ref 0.30–0.70)

## 2015-12-24 LAB — ECHOCARDIOGRAM COMPLETE
Height: 70 in
WEIGHTICAEL: 2490.32 [oz_av]

## 2015-12-24 LAB — HEMOGLOBIN A1C
Hgb A1c MFr Bld: 12 % — ABNORMAL HIGH (ref 4.8–5.6)
Mean Plasma Glucose: 298 mg/dL

## 2015-12-24 LAB — TROPONIN I: TROPONIN I: 6.97 ng/mL — AB (ref <0.03)

## 2015-12-24 MED ORDER — SODIUM CHLORIDE 0.9 % WEIGHT BASED INFUSION
3.0000 mL/kg/h | INTRAVENOUS | Status: DC
  Administered 2015-12-25: 3 mL/kg/h via INTRAVENOUS

## 2015-12-24 MED ORDER — INSULIN GLARGINE 100 UNIT/ML ~~LOC~~ SOLN
10.0000 [IU] | Freq: Every day | SUBCUTANEOUS | Status: DC
  Administered 2015-12-24 – 2015-12-26 (×3): 10 [IU] via SUBCUTANEOUS
  Filled 2015-12-24 (×3): qty 0.1

## 2015-12-24 MED ORDER — INSULIN ASPART 100 UNIT/ML ~~LOC~~ SOLN
0.0000 [IU] | SUBCUTANEOUS | Status: DC
  Administered 2015-12-24 (×2): 5 [IU] via SUBCUTANEOUS
  Administered 2015-12-24: 8 [IU] via SUBCUTANEOUS
  Administered 2015-12-25 (×2): 3 [IU] via SUBCUTANEOUS
  Administered 2015-12-25: 5 [IU] via SUBCUTANEOUS
  Administered 2015-12-25: 2 [IU] via SUBCUTANEOUS
  Administered 2015-12-25: 3 [IU] via SUBCUTANEOUS
  Administered 2015-12-25 – 2015-12-26 (×2): 2 [IU] via SUBCUTANEOUS
  Administered 2015-12-26: 3 [IU] via SUBCUTANEOUS
  Administered 2015-12-26: 5 [IU] via SUBCUTANEOUS
  Administered 2015-12-26 – 2015-12-27 (×3): 2 [IU] via SUBCUTANEOUS
  Administered 2015-12-27 (×2): 3 [IU] via SUBCUTANEOUS

## 2015-12-24 MED ORDER — LIVING WELL WITH DIABETES BOOK
Freq: Once | Status: AC
Start: 2015-12-24 — End: 2015-12-24
  Administered 2015-12-24: 18:00:00
  Filled 2015-12-24: qty 1

## 2015-12-24 MED ORDER — SODIUM CHLORIDE 0.9% FLUSH
3.0000 mL | Freq: Two times a day (BID) | INTRAVENOUS | Status: DC
Start: 2015-12-24 — End: 2015-12-25
  Administered 2015-12-24: 3 mL via INTRAVENOUS

## 2015-12-24 MED ORDER — HEPARIN BOLUS VIA INFUSION
2000.0000 [IU] | Freq: Once | INTRAVENOUS | Status: AC
  Administered 2015-12-24: 2000 [IU] via INTRAVENOUS
  Filled 2015-12-24: qty 2000

## 2015-12-24 MED ORDER — ASPIRIN 81 MG PO CHEW
81.0000 mg | CHEWABLE_TABLET | ORAL | Status: AC
  Administered 2015-12-25: 81 mg via ORAL
  Filled 2015-12-24: qty 1

## 2015-12-24 MED ORDER — SODIUM CHLORIDE 0.9% FLUSH: 3.0000 mL | INTRAVENOUS | Status: DC | PRN

## 2015-12-24 MED ORDER — SODIUM CHLORIDE 0.9 % IV SOLN: 250.0000 mL | INTRAVENOUS | Status: DC | PRN

## 2015-12-24 MED ORDER — ASPIRIN 81 MG PO CHEW: 324.0000 mg | CHEWABLE_TABLET | ORAL | Status: AC

## 2015-12-24 MED ORDER — SODIUM CHLORIDE 0.9 % WEIGHT BASED INFUSION: 1.0000 mL/kg/h | INTRAVENOUS | Status: DC

## 2015-12-24 MED ORDER — LIVING WELL WITH DIABETES BOOK - IN SPANISH
Freq: Once | Status: AC
  Administered 2015-12-24: 17:00:00
  Filled 2015-12-24: qty 1

## 2015-12-24 NOTE — Consult Note (Signed)
Medical Consultation   Kurt Mathis  [id number]  DOB: 22-Jun-1960  DOA: 12/23/2015  PCP: Sanda Klein, MD    Requesting physician: Dr Sanda Klein.Cardiology  Reason for consultation: New-onset diabetes   History of Present Illness: Kurt Mathis is an 55 y.o. HM primary Spanish speaker PMHx absence of serious medical problems presents with roughly 5 day history of unstable angina. Starting on Wednesday he noticed that he would experience chest pressure radiating to his throat and jaw,  whenever he would be in contact with cold air or drink liquids. The symptoms occurred off and on for the next several days. Friday night around 8:30 the symptoms became more severe and incessant, without relief. He could not find a position which was comfortable and it seems that he was describing some degree of orthopnea.   While in the emergency room at [facility] he still had some mild chest discomfort and was administered nitroglycerin. He reports that he is now completely asymptomatic. He is on intravenous heparin.   His electrocardiogram does not show high risk findings. There are subtle T-wave changes primarily in leads V5-V6. No Q waves are seen. No significant ST segment deviation is seen. Cardiac enzymes are abnormal with a troponin of around 6.  He denies palpitations, syncope, leg edema, intermittent claudication, cough, hemoptysis, fever or chills, abdominal pain, change in bowel pattern, major weight changes, intolerance to heat or cold, hot flashes, diaphoresis, polyuria, polydipsia, polyphagia.  Nonfasting blood sugar 290. He denies a history of diabetes.   Review of Systems:  Review of Systems  Constitutional: Negative.   HENT: Negative.   Eyes: Negative.   Respiratory: Negative.   Cardiovascular: Negative.   Gastrointestinal: Negative.   Genitourinary: Negative.   Musculoskeletal: Negative.   Skin: Negative.   Neurological: Negative.     Endo/Heme/Allergies: Negative.   Psychiatric/Behavioral: Negative.      Past Medical History: History reviewed. No pertinent past medical history.  Past Surgical History: History reviewed. No pertinent surgical history.   Allergies:  No Known Allergies   Social History:  has no tobacco, alcohol, and drug history on file.   Family History: Negative family history HTN, HLD, diabetes, CVA, cancer     Procedures/Significant Events:  9/17 Echocardiogram:- Left ventricle:moderate concentric hypertrophy. -LVEF= 45% to 50%. There   is hypokinesis of the inferolateral, inferior, and inferoseptal  myocardium. --(grade 2 diastolic dysfunction).    Cultures NA  Antimicrobials: none   Devices none   LINES / TUBES:  none    Continuous Infusions: . heparin 1,300 Units/hr (12/24/15 0547)     Physical Exam: Vitals:   12/24/15 0338 12/24/15 0736 12/24/15 0800 12/24/15 0943  BP: 136/85     Pulse: 81  74   Resp: (!) 21  13   Temp:  97.6 F (36.4 C)  97.4 F (36.3 C)  TempSrc: Oral Oral  Oral  SpO2: 100%  99%   Weight: 70.6 kg (155 lb 10.3 oz)     Height:        General: A/O 4, NAD,No acute respiratory distress Eyes: negative scleral hemorrhage, negative anisocoria, negative icterus ENT: Negative Runny nose, negative gingival bleeding, Neck:  Negative scars, masses, torticollis, lymphadenopathy, JVD Lungs: Clear to auscultation bilaterally without wheezes or crackles Cardiovascular: Regular rate and rhythm without murmur gallop or rub normal S1 and S2 Abdomen: negative abdominal pain, nondistended, positive soft, bowel sounds, no rebound, no ascites, no appreciable  mass Extremities: No significant cyanosis, clubbing, or edema bilateral lower extremities Skin: Negative rashes, lesions, ulcers Psychiatric:  Negative depression, negative anxiety, negative fatigue, negative mania  Central nervous system:  Cranial nerves II through XII intact, tongue/uvula  midline, all extremities muscle strength 5/5, sensation intact throughout,  negative dysarthria, negative expressive aphasia, negative receptive aphasia.  Data reviewed:  I have personally reviewed following labs and imaging studies Labs:  CBC:  Recent Labs Lab 12/23/15 1100 12/24/15 0312 12/24/15 0447  WBC 7.8 7.9 8.7  HGB 15.4 8.5* 14.2  HCT 44.5 29.2* 42.2  MCV 88.5 101.0* 90.2  PLT 288 326 [id number]    Basic Metabolic Panel:  Recent Labs Lab 12/23/15 1100 12/23/15 1200  NA 136 138  K 4.0 3.9  CL 101 103  CO2 26 25  GLUCOSE 309* 290*  BUN 13 13  CREATININE 0.74 0.64  CALCIUM 9.3 8.8*  MG  --  1.8   GFR Estimated Creatinine Clearance: 104.2 mL/min (by C-G formula based on SCr of 0.64 mg/dL). Liver Function Tests:  Recent Labs Lab 12/23/15 1200  AST 48*  ALT 40  ALKPHOS 74  BILITOT 0.7  PROT 7.2  ALBUMIN 3.9   No results for input(s): LIPASE, AMYLASE in the last 168 hours. No results for input(s): AMMONIA in the last 168 hours. Coagulation profile  Recent Labs Lab 12/23/15 1200  INR 1.07    Cardiac Enzymes:  Recent Labs Lab 12/23/15 1200 12/24/15 0156  TROPONINI 5.93* 6.97*   BNP: Invalid input(s): POCBNP CBG:  Recent Labs Lab 12/23/15 1704 12/23/15 2147 12/24/15 0738  GLUCAP 200* 243* 249*   D-Dimer No results for input(s): DDIMER in the last 72 hours. Hgb A1c No results for input(s): HGBA1C in the last 72 hours. Lipid Profile  Recent Labs  12/24/15 0312  CHOL 173  HDL 34*  LDLCALC 84  TRIG 275*  CHOLHDL 5.1   Thyroid function studies  Recent Labs  12/23/15 1200  TSH 1.550   Anemia work up No results for input(s): VITAMINB12, FOLATE, FERRITIN, TIBC, IRON, RETICCTPCT in the last 72 hours. Urinalysis No results found for: COLORURINE, APPEARANCEUR, [place], [place], [place], [place], [place], [place], [place], [place], NITRITE, [place]   Microbiology Recent Results (from the past 240 hour(s))  MRSA  PCR Screening     Status: None   Collection Time: 12/23/15  3:30 PM  Result Value Ref Range Status   MRSA by PCR NEGATIVE NEGATIVE Final    Comment:        The GeneXpert MRSA Assay (FDA approved for NASAL specimens only), is one component of a comprehensive MRSA colonization surveillance program. It is not intended to diagnose MRSA infection nor to guide or monitor treatment for MRSA infections.        Inpatient Medications:   Scheduled Meds: . aspirin  324 mg Oral NOW   Or  . aspirin  300 mg Rectal NOW  . aspirin EC  81 mg Oral Daily  . atorvastatin  80 mg Oral q1800  . insulin aspart  0-15 Units Subcutaneous TID WC  . insulin aspart  0-15 Units Subcutaneous TID WC  . insulin aspart  0-5 Units Subcutaneous QHS  . metoprolol tartrate  25 mg Oral BID   Continuous Infusions: . heparin 1,300 Units/hr (12/24/15 0547)     Radiological Exams on Admission: Dg Chest 2 View  Result Date: 12/23/2015 CLINICAL DATA:  Tachycardia and chest pain for 3 days. EXAM: CHEST  2 VIEW COMPARISON:  None. FINDINGS: The cardiomediastinal  silhouette is unremarkable. There is no evidence of focal airspace disease, pulmonary edema, suspicious pulmonary nodule/mass, pleural effusion, or pneumothorax. No acute bony abnormalities are identified. IMPRESSION: No active cardiopulmonary disease. Electronically Signed   By: Margarette Canada M.D.   On: 12/23/2015 11:50    Impression/Recommendations Active Problems:   NSTEMI (non-ST elevated myocardial infarction) ([place])   Diabetes mellitus type 2, uncontrolled, with complications (HCC)  NSTEMI/Acute systolic and diastolic CHF -Echocardiogram: See results above -Cardiology plans to perform cardiac catheterizationn 9/18  HTN -Metoprolol 25 mg BID  Diabetes type 2 uncontrolled with complication -0000000 Hemoglobin A1c pending -lipid panel pending  -consult to diabetic nutrition placed -Consult to diabetic coordinator placed -consult to [place] placed  In  order to help patient with obtaining medication and insurance. -Lantus 10 units daily -Moderate SSI   Thank you for this consultation.  Our [facility] hospitalist team will follow the patient with you.   Time Spent: 60 minutes  Natsumi Whitsitt, Geraldo Docker M.D. [facility] 12/24/2015, 11:09 AM

## 2015-12-24 NOTE — Progress Notes (Signed)
Noticed lab updated CBC at 0415 after new lab ordered at 0410.  Lab drawn new CBC around 0455.  Called and Instructed lab to run lab just drawn and not to use prior  Collection.    Will continue to monitor Saunders Revel T

## 2015-12-24 NOTE — Progress Notes (Signed)
ANTICOAGULATION CONSULT NOTE - Pharmacy Consult for Heparin Indication: chest pain/ACS  No Known Allergies  Patient Measurements: Height: 5\' 10"  (177.8 cm) Weight: 155 lb 10.3 oz (70.6 kg) IBW/kg (Calculated) : 73 Heparin Dosing Weight: 72.4 kg  Vital Signs: Temp: 97.5 F (36.4 C) (09/17 1228) Temp Source: Oral (09/17 1228) BP: 131/81 (09/17 1626) Pulse Rate: 78 (09/17 1626)  Labs:  Recent Labs  12/23/15 1100 12/23/15 1200  12/24/15 0156 12/24/15 0312 12/24/15 0447 12/24/15 1256 12/24/15 1847  HGB 15.4  --   --   --  8.5* 14.2  --   --   HCT 44.5  --   --   --  29.2* 42.2  --   --   PLT 288  --   --   --  326 289  --   --   LABPROT  --  13.9  --   --   --   --   --   --   INR  --  1.07  --   --   --   --   --   --   HEPARINUNFRC  --   --   < >  --  0.10*  --  0.33 0.34  CREATININE 0.74 0.64  --   --   --   --   --   --   TROPONINI  --  5.93*  --  6.97*  --   --   --   --   < > = values in this interval not displayed. Estimated Creatinine Clearance: 104.2 mL/min (by C-G formula based on SCr of 0.64 mg/dL).  Medical History: History reviewed. No pertinent past medical history.  Medications:  Scheduled:  . [START ON 12/25/2015] aspirin  324 mg Oral Pre-Cath  . [START ON 12/25/2015] aspirin  81 mg Oral Pre-Cath  . aspirin EC  81 mg Oral Daily  . atorvastatin  80 mg Oral q1800  . insulin aspart  0-15 Units Subcutaneous Q4H  . insulin glargine  10 Units Subcutaneous QHS  . metoprolol tartrate  25 mg Oral BID  . sodium chloride flush  3 mL Intravenous Q12H   Infusions:  . [START ON 12/25/2015] sodium chloride     Followed by  . [START ON 12/25/2015] sodium chloride    . heparin 1,300 Units/hr (12/24/15 0547)   Assessment: 2 yoM with chest discomfort x 3 days, family hx of sudden death/cardiac arrest.  Initial Troponin 6.24, Pharmacy consulted for  IV Heparin for ACS/STEMI  HL now therapeutic at 0.33. Repeat CBC WNL and no reported s/s bleeding.  PM f/u >  heparin level remains at goal.  Goal of Therapy:  Heparin level 0.3-0.7 units/ml Monitor platelets by anticoagulation protocol: Yes   Plan:  Continue heparin at 1300 units/hr Daily heparin level and CBC F/u plans for [facility] on Monday   Uvaldo Rising, BCPS  Clinical Pharmacist Pager [phone]  12/24/2015 7:20 PM

## 2015-12-24 NOTE — Progress Notes (Signed)
ANTICOAGULATION CONSULT NOTE - Pharmacy Consult for Heparin Indication: chest pain/ACS  No Known Allergies  Patient Measurements: Height: 5\' 10"  (177.8 cm) Weight: 155 lb 10.3 oz (70.6 kg) IBW/kg (Calculated) : 73 Heparin Dosing Weight: 72.4 kg  Vital Signs: Temp: 98.9 F (37.2 C) (09/16 2307) Temp Source: Oral (09/17 0338) BP: 136/85 (09/17 0338) Pulse Rate: 81 (09/17 0338)  Labs:  Recent Labs  12/23/15 1100 12/23/15 1200 12/23/15 2058 12/24/15 0156 12/24/15 0312 12/24/15 0447  HGB 15.4  --   --   --  8.5* 14.2  HCT 44.5  --   --   --  29.2* 42.2  PLT 288  --   --   --  326 289  LABPROT  --  13.9  --   --   --   --   INR  --  1.07  --   --   --   --   HEPARINUNFRC  --   --  <0.10*  --  0.10*  --   CREATININE 0.74 0.64  --   --   --   --   TROPONINI  --  5.93*  --  6.97*  --   --    Estimated Creatinine Clearance: 104.2 mL/min (by C-G formula based on SCr of 0.64 mg/dL).  Medical History: History reviewed. No pertinent past medical history.  Medications:  Scheduled:  . aspirin  324 mg Oral NOW   Or  . aspirin  300 mg Rectal NOW  . aspirin EC  81 mg Oral Daily  . atorvastatin  80 mg Oral q1800  . insulin aspart  0-15 Units Subcutaneous TID WC  . insulin aspart  0-15 Units Subcutaneous TID WC  . insulin aspart  0-5 Units Subcutaneous QHS  . metoprolol tartrate  25 mg Oral BID   Infusions:  . heparin 1,150 Units/hr (12/23/15 2219)   Assessment: 76 yoM with chest discomfort x 3 days, family hx of sudden death/cardiac arrest.  Initial Troponin 6.24, Pharmacy consulted for  IV Heparin for ACS/STEMI  AM HL 0.1. Repeat CBC WNL  Goal of Therapy:  Heparin level 0.3-0.7 units/ml Monitor platelets by anticoagulation protocol: Yes   Plan:  Re-bolus heparin 2000 units IV x1 Increase heparin drip rate to 1350 units/hr 8 hr heparin level Daily heparin level and CBC.  Laraya Pestka S. Alford Highland, PharmD, [facility] Clinical Staff Pharmacist Pager [phone]  12/24/2015,  5:45 AM

## 2015-12-24 NOTE — Progress Notes (Signed)
ANTICOAGULATION CONSULT NOTE - Pharmacy Consult for Heparin Indication: chest pain/ACS  No Known Allergies  Patient Measurements: Height: 5\' 10"  (177.8 cm) Weight: 155 lb 10.3 oz (70.6 kg) IBW/kg (Calculated) : 73 Heparin Dosing Weight: 72.4 kg  Vital Signs: Temp: 97.5 F (36.4 C) (09/17 1228) Temp Source: Oral (09/17 1228) BP: 124/76 (09/17 1228) Pulse Rate: 82 (09/17 1228)  Labs:  Recent Labs  12/23/15 1100 12/23/15 1200 12/23/15 2058 12/24/15 0156 12/24/15 0312 12/24/15 0447 12/24/15 1256  HGB 15.4  --   --   --  8.5* 14.2  --   HCT 44.5  --   --   --  29.2* 42.2  --   PLT 288  --   --   --  326 289  --   LABPROT  --  13.9  --   --   --   --   --   INR  --  1.07  --   --   --   --   --   HEPARINUNFRC  --   --  <0.10*  --  0.10*  --  0.33  CREATININE 0.74 0.64  --   --   --   --   --   TROPONINI  --  5.93*  --  6.97*  --   --   --    Estimated Creatinine Clearance: 104.2 mL/min (by C-G formula based on SCr of 0.64 mg/dL).  Medical History: History reviewed. No pertinent past medical history.  Medications:  Scheduled:  . aspirin EC  81 mg Oral Daily  . atorvastatin  80 mg Oral q1800  . insulin aspart  0-15 Units Subcutaneous Q4H  . metoprolol tartrate  25 mg Oral BID   Infusions:  . heparin 1,300 Units/hr (12/24/15 0547)   Assessment: 37 yoM with chest discomfort x 3 days, family hx of sudden death/cardiac arrest.  Initial Troponin 6.24, Pharmacy consulted for  IV Heparin for ACS/STEMI  HL now therapeutic at 0.33. Repeat CBC WNL and no reported s/s bleeding.  Goal of Therapy:  Heparin level 0.3-0.7 units/ml Monitor platelets by anticoagulation protocol: Yes   Plan:  Continue heparin at 1300 units/hr 6 hr heparin level to confirm Daily heparin level and CBC F/u plans for [facility] on Monday   Tex Conroy K. Velva Harman, PharmD, BCPS, CPP Clinical Pharmacist Pager: [phone] Phone: [phone] 12/24/2015 2:06 PM

## 2015-12-24 NOTE — Progress Notes (Signed)
Pt wants to wait to watch Cath video when wife is at [facility], pt's wife updated consent filled out awaiting pt signature. Also DM Coordinator called and stated pt to watch DM videos, pt also wants to wait until wife arrives. Wife called and updated on plan, nursing will cont to monitor

## 2015-12-24 NOTE — Progress Notes (Addendum)
Inpatient Diabetes Program Recommendations  AACE/ADA: New Consensus Statement on Inpatient Glycemic Control (2015)  Target Ranges:  Prepandial:   less than 140 mg/dL      Peak postprandial:   less than 180 mg/dL (1-2 hours)      Critically ill patients:  140 - 180 mg/dL   Lab Results  Component Value Date   GLUCAP 259 (H) 12/24/2015   HGBA1C 12.0 (H) 12/23/2015    Review of Glycemic Control  Diabetes history: None Outpatient Diabetes medications: None Current orders for Inpatient glycemic control: Novolog moderate Q4H  Ordered Living Well book, Diabetes videos. Will order insulin starter kit and RN to begin teaching insulin administration.  Inpatient Diabetes Program Recommendations:    Add Lantus 10 units QHS Change Novolog to moderate tidwc and hs  Will need PCP referral at discharge for management of DM. Will need prescription for meter and supplies.  Coordinator to speak with pt in am regarding new diagnosis and HgbA1C. Will continue to follow.  Thank you. Lorenda Peck, RD, LDN, CDE Inpatient Diabetes Coordinator [phone]  Addendum: Spoke with RN regarding DM teaching. RN states wife is very difficult to deal with. Very aggressive and c/o pt receiving regular tray instead of CHO mod. Pt states he doesn't want to go home on insulin. Needs a lot of education. Pt does not want to watch videos without wife being there.

## 2015-12-24 NOTE — Progress Notes (Signed)
Patient Name: Kurt Mathis Date of Encounter: 12/24/2015  Active Problems:   NSTEMI (non-ST elevated myocardial infarction) ([place])   Diabetes mellitus type 2 in nonobese ([place])   Length of Stay: 1  SUBJECTIVE  No angina overnight. Troponin seems to have peaked at 6-7. ECG remains relatively quiet, minor T wave flattening I, aVL, V6. Overnight blood draw reported Hgb 8.5, clearly in error - recheck was 14.5. No bleeding seen. The same 3AM blood draw was the source of his lipid profile (so I doubt it is accurate).  Glucose persistently elevated. A1c pending.   CURRENT MEDS . aspirin  324 mg Oral NOW   Or  . aspirin  300 mg Rectal NOW  . aspirin EC  81 mg Oral Daily  . atorvastatin  80 mg Oral q1800  . insulin aspart  0-15 Units Subcutaneous TID WC  . insulin aspart  0-15 Units Subcutaneous TID WC  . insulin aspart  0-5 Units Subcutaneous QHS  . metoprolol tartrate  25 mg Oral BID    OBJECTIVE   Intake/Output Summary (Last 24 hours) at 12/24/15 1024 Last data filed at 12/24/15 0600  Gross per 24 hour  Intake           311.97 ml  Output              375 ml  Net           -63.03 ml   Filed Weights   12/23/15 1151 12/23/15 1509 12/24/15 0338  Weight: 76.2 kg (168 lb) 72.4 kg (159 lb 9.8 oz) 70.6 kg (155 lb 10.3 oz)    PHYSICAL EXAM Vitals:   12/24/15 0338 12/24/15 0736 12/24/15 0800 12/24/15 0943  BP: 136/85     Pulse: 81  74   Resp: (!) 21  13   Temp:  97.6 F (36.4 C)  97.4 F (36.3 C)  TempSrc: Oral Oral  Oral  SpO2: 100%  99%   Weight: 70.6 kg (155 lb 10.3 oz)     Height:       General: Alert, oriented x3, no distress Head: no evidence of trauma, PERRL, EOMI, no exophtalmos or lid lag, no myxedema, no xanthelasma; normal ears, nose and oropharynx Neck: normal jugular venous pulsations and no hepatojugular reflux; brisk carotid pulses without delay and no carotid bruits Chest: clear to auscultation, no signs of consolidation by percussion or  palpation, normal fremitus, symmetrical and full respiratory excursions Cardiovascular: normal position and quality of the apical impulse, regular rhythm, normal first and second heart sounds, no rubs, no murmur, S4 present Abdomen: no tenderness or distention, no masses by palpation, no abnormal pulsatility or arterial bruits, normal bowel sounds, no hepatosplenomegaly Extremities: no clubbing, cyanosis or edema; 2+ radial, ulnar and brachial pulses bilaterally; 2+ right femoral, posterior tibial and dorsalis pedis pulses; 2+ left femoral, posterior tibial and dorsalis pedis pulses; no subclavian or femoral bruits Neurological: grossly nonfocal  LABS  CBC  Recent Labs  12/24/15 0312 12/24/15 0447  WBC 7.9 8.7  HGB 8.5* 14.2  HCT 29.2* 42.2  MCV 101.0* 90.2  PLT 326 [id number]   Basic Metabolic Panel  Recent Labs  12/23/15 1100 12/23/15 1200  NA 136 138  K 4.0 3.9  CL 101 103  CO2 26 25  GLUCOSE 309* 290*  BUN 13 13  CREATININE 0.74 0.64  CALCIUM 9.3 8.8*  MG  --  1.8   Liver Function Tests  Recent Labs  12/23/15 1200  AST 48*  ALT 40  ALKPHOS 74  BILITOT 0.7  PROT 7.2  ALBUMIN 3.9   No results for input(s): LIPASE, AMYLASE in the last 72 hours. Cardiac Enzymes  Recent Labs  12/23/15 1200 12/24/15 0156  TROPONINI 5.93* 6.97*   BNP Invalid input(s): POCBNP D-Dimer No results for input(s): DDIMER in the last 72 hours. Hemoglobin A1C No results for input(s): HGBA1C in the last 72 hours. Fasting Lipid Panel  Recent Labs  12/24/15 0312  CHOL 173  HDL 34*  LDLCALC 84  TRIG 275*  CHOLHDL 5.1   Thyroid Function Tests  Recent Labs  12/23/15 1200  TSH 1.550    Radiology Studies Imaging results have been reviewed and Dg Chest 2 View  Result Date: 12/23/2015 CLINICAL DATA:  Tachycardia and chest pain for 3 days. EXAM: CHEST  2 VIEW COMPARISON:  None. FINDINGS: The cardiomediastinal silhouette is unremarkable. There is no evidence of focal airspace  disease, pulmonary edema, suspicious pulmonary nodule/mass, pleural effusion, or pneumothorax. No acute bony abnormalities are identified. IMPRESSION: No active cardiopulmonary disease. Electronically Signed   By: Margarette Canada M.D.   On: 12/23/2015 11:50    TELE NSR  ECG NSR, T waves flat in lateral leads  ASSESSMENT AND PLAN  1. NSTEMI: no angina, troponin has peaked. Echo pending. Coronary angio +/- PCI stent tomorrow. This procedure has been fully reviewed with the patient and written informed consent has been obtained. 2. DM: newly diagnosed. Will ask for hospitalist assistance to initiate medical therapy and a follow up plan. Diabetes education. 3. HLP: unsure that the sample is accurate (see above). Regardless, plan high dose atorvastatin for a year and reevaluate long term lipid lowering therapy based on follow up results. Diet and exercise discussed. 4. Social issues:  He reports being uninsured at this time after losing his job. Unclear if he can get COBRA. He has been married to his American citizen wife for 11 years, but his own immigration status is not settled. Will need assistance from the case manager/billing office. It is highly likely he will get a stent on this admission and will need access to antiplatelet therapy to avoid readmission and serious complications.   Sanda Klein, MD, [facility] [phone] office [phone] pager 12/24/2015 10:24 AM

## 2015-12-24 NOTE — Progress Notes (Signed)
  Echocardiogram 2D Echocardiogram has been performed.  Kurt Mathis 12/24/2015, 3:09 PM

## 2015-12-24 NOTE — Progress Notes (Signed)
Notified pharmacy and lab.  hgb 15.4 down to 8.5 with no signs of bleeding noted.  Pt is on heparin.  Lab to redrawn CBC Stat.  Will keep heparin infusing at this time until lab resulted  per pharmacy.   Will continue to monitor. Saunders Revel T

## 2015-12-25 ENCOUNTER — Encounter (HOSPITAL_COMMUNITY)
Admission: EM | Disposition: A | Discharge status: Home / Self Care | Payer: Self-pay | Source: Home / Self Care | Attending: Cardiovascular Disease

## 2015-12-25 DIAGNOSIS — E1165 Type 2 diabetes mellitus with hyperglycemia: Secondary | ICD-10-CM

## 2015-12-25 DIAGNOSIS — I25110 Atherosclerotic heart disease of native coronary artery with unstable angina pectoris: Secondary | ICD-10-CM

## 2015-12-25 DIAGNOSIS — I10 Essential (primary) hypertension: Secondary | ICD-10-CM

## 2015-12-25 DIAGNOSIS — I214 Non-ST elevation (NSTEMI) myocardial infarction: Secondary | ICD-10-CM

## 2015-12-25 DIAGNOSIS — E118 Type 2 diabetes mellitus with unspecified complications: Secondary | ICD-10-CM

## 2015-12-25 HISTORY — PX: CARDIAC CATHETERIZATION: SHX172

## 2015-12-25 LAB — GLUCOSE, CAPILLARY
GLUCOSE-CAPILLARY: 130 mg/dL — AB (ref 65–99)
GLUCOSE-CAPILLARY: 146 mg/dL — AB (ref 65–99)
GLUCOSE-CAPILLARY: 159 mg/dL — AB (ref 65–99)
GLUCOSE-CAPILLARY: 178 mg/dL — AB (ref 65–99)
GLUCOSE-CAPILLARY: 238 mg/dL — AB (ref 65–99)
Glucose-Capillary: 185 mg/dL — ABNORMAL HIGH (ref 65–99)
Glucose-Capillary: 200 mg/dL — ABNORMAL HIGH (ref 65–99)

## 2015-12-25 LAB — CBC
HCT: 41.9 % (ref 39.0–52.0)
HCT: 41.1 % (ref 39.0–52.0)
HEMOGLOBIN: 13.8 g/dL (ref 13.0–17.0)
Hemoglobin: 14 g/dL (ref 13.0–17.0)
MCH: 30.1 pg (ref 26.0–34.0)
MCH: 30.1 pg (ref 26.0–34.0)
MCHC: 33.4 g/dL (ref 30.0–36.0)
MCHC: 33.6 g/dL (ref 30.0–36.0)
MCV: 90.1 fL (ref 78.0–100.0)
MCV: 89.5 fL (ref 78.0–100.0)
PLATELETS: 311 10*3/uL (ref 150–400)
PLATELETS: 314 10*3/uL (ref 150–400)
RBC: 4.65 MIL/uL (ref 4.22–5.81)
RBC: 4.59 MIL/uL (ref 4.22–5.81)
RDW: 12.8 % (ref 11.5–15.5)
RDW: 12.8 % (ref 11.5–15.5)
WBC: 8.8 10*3/uL (ref 4.0–10.5)
WBC: 6.5 10*3/uL (ref 4.0–10.5)

## 2015-12-25 LAB — HEMOGLOBIN A1C
HEMOGLOBIN A1C: 11.7 % — AB (ref 4.8–5.6)
MEAN PLASMA GLUCOSE: 289 mg/dL

## 2015-12-25 LAB — CREATININE, SERUM
CREATININE: 0.61 mg/dL (ref 0.61–1.24)
GFR calc Af Amer: >60 mL/min (ref >60)

## 2015-12-25 LAB — HEPARIN LEVEL (UNFRACTIONATED): HEPARIN UNFRACTIONATED: 0.29 [IU]/mL — AB (ref 0.30–0.70)

## 2015-12-25 SURGERY — LEFT HEART CATH AND CORONARY ANGIOGRAPHY
Anesthesia: LOCAL

## 2015-12-25 MED ORDER — IOPAMIDOL (ISOVUE-370) INJECTION 76%
INTRAVENOUS | Status: DC | PRN
  Administered 2015-12-25: 100 mL via INTRA_ARTERIAL

## 2015-12-25 MED ORDER — FENTANYL CITRATE (PF) 100 MCG/2ML IJ SOLN
INTRAMUSCULAR | Status: AC
  Filled 2015-12-25: qty 2

## 2015-12-25 MED ORDER — ASPIRIN 81 MG PO CHEW: 81.0000 mg | CHEWABLE_TABLET | Freq: Every day | ORAL | Status: DC

## 2015-12-25 MED ORDER — MIDAZOLAM HCL 2 MG/2ML IJ SOLN
INTRAMUSCULAR | Status: AC
  Filled 2015-12-25: qty 2

## 2015-12-25 MED ORDER — SODIUM CHLORIDE 0.9 % IV SOLN: 250.0000 mL | INTRAVENOUS | Status: DC | PRN

## 2015-12-25 MED ORDER — ATORVASTATIN CALCIUM 80 MG PO TABS
80.0000 mg | ORAL_TABLET | Freq: Every day | ORAL | Status: DC
  Administered 2015-12-25 – 2015-12-31 (×6): 80 mg via ORAL
  Filled 2015-12-25 (×6): qty 1

## 2015-12-25 MED ORDER — LIDOCAINE HCL (PF) 1 % IJ SOLN
INTRAMUSCULAR | Status: DC | PRN
  Administered 2015-12-25: 2 mL

## 2015-12-25 MED ORDER — HEPARIN SODIUM (PORCINE) 1000 UNIT/ML IJ SOLN
INTRAMUSCULAR | Status: DC | PRN
Start: 2015-12-25 — End: 2015-12-25
  Administered 2015-12-25: 3800 [IU] via INTRAVENOUS

## 2015-12-25 MED ORDER — ACETAMINOPHEN 325 MG PO TABS: 650.0000 mg | ORAL_TABLET | ORAL | Status: DC | PRN

## 2015-12-25 MED ORDER — CLOPIDOGREL BISULFATE 75 MG PO TABS: 75.0000 mg | ORAL_TABLET | Freq: Every day | ORAL | Status: DC

## 2015-12-25 MED ORDER — VERAPAMIL HCL 2.5 MG/ML IV SOLN
INTRAVENOUS | Status: AC
Start: 2015-12-25 — End: 2015-12-25
  Filled 2015-12-25: qty 2

## 2015-12-25 MED ORDER — ONDANSETRON HCL 4 MG/2ML IJ SOLN: 4.0000 mg | Freq: Four times a day (QID) | INTRAMUSCULAR | Status: DC | PRN

## 2015-12-25 MED ORDER — FENTANYL CITRATE (PF) 100 MCG/2ML IJ SOLN
INTRAMUSCULAR | Status: DC | PRN
  Administered 2015-12-25: 50 ug via INTRAVENOUS

## 2015-12-25 MED ORDER — HEPARIN (PORCINE) IN NACL 100-0.45 UNIT/ML-% IJ SOLN: 1350.0000 [IU]/h | INTRAMUSCULAR | Status: DC

## 2015-12-25 MED ORDER — MIDAZOLAM HCL 2 MG/2ML IJ SOLN
INTRAMUSCULAR | Status: DC | PRN
  Administered 2015-12-25: 1 mg via INTRAVENOUS

## 2015-12-25 MED ORDER — INSULIN STARTER KIT- SYRINGES (SPANISH)
1.0000 | Freq: Once | Status: AC
  Administered 2015-12-25: 1
  Filled 2015-12-25: qty 1

## 2015-12-25 MED ORDER — LIDOCAINE HCL (PF) 1 % IJ SOLN
INTRAMUSCULAR | Status: AC
  Filled 2015-12-25: qty 30

## 2015-12-25 MED ORDER — IOPAMIDOL (ISOVUE-370) INJECTION 76%
INTRAVENOUS | Status: AC
  Filled 2015-12-25: qty 100

## 2015-12-25 MED ORDER — SODIUM CHLORIDE 0.9% FLUSH
3.0000 mL | INTRAVENOUS | Status: DC | PRN
  Administered 2015-12-26: 3 mL via INTRAVENOUS
  Filled 2015-12-25: qty 3

## 2015-12-25 MED ORDER — LIVING WELL WITH DIABETES BOOK - IN SPANISH
Freq: Once | Status: AC
  Administered 2015-12-25: 10:00:00
  Filled 2015-12-25: qty 1

## 2015-12-25 MED ORDER — VERAPAMIL HCL 2.5 MG/ML IV SOLN
INTRAVENOUS | Status: DC | PRN
  Administered 2015-12-25: 8 mL via INTRA_ARTERIAL

## 2015-12-25 MED ORDER — HEPARIN (PORCINE) IN NACL 2-0.9 UNIT/ML-% IJ SOLN
INTRAMUSCULAR | Status: DC | PRN
  Administered 2015-12-25: 1500 mL

## 2015-12-25 MED ORDER — SODIUM CHLORIDE 0.9% FLUSH
3.0000 mL | Freq: Two times a day (BID) | INTRAVENOUS | Status: DC
  Administered 2015-12-25 – 2015-12-31 (×10): 3 mL via INTRAVENOUS

## 2015-12-25 MED ORDER — NITROGLYCERIN 1 MG/10 ML FOR IR/CATH LAB
INTRA_ARTERIAL | Status: DC | PRN
  Administered 2015-12-25: 200 ug via INTRA_ARTERIAL

## 2015-12-25 MED ORDER — ENOXAPARIN SODIUM 40 MG/0.4ML ~~LOC~~ SOLN
40.0000 mg | SUBCUTANEOUS | Status: DC
  Administered 2015-12-26: 40 mg via SUBCUTANEOUS
  Filled 2015-12-25: qty 0.4

## 2015-12-25 MED ORDER — OXYCODONE-ACETAMINOPHEN 5-325 MG PO TABS: 1.0000 | ORAL_TABLET | ORAL | Status: DC | PRN

## 2015-12-25 MED ORDER — SODIUM CHLORIDE 0.9 % WEIGHT BASED INFUSION: 1.0000 mL/kg/h | INTRAVENOUS | Status: AC

## 2015-12-25 SURGICAL SUPPLY — 10 items
CATH INFINITI 5 FR JL3.5 (CATHETERS) ×2 IMPLANT
CATH INFINITI JR4 5F (CATHETERS) ×2 IMPLANT
COVER DOME SNAP 22 D (MISCELLANEOUS) ×2 IMPLANT
DEVICE RAD COMP TR BAND LRG (VASCULAR PRODUCTS) ×2 IMPLANT
GLIDESHEATH SLEND SS 6F .021 (SHEATH) ×2 IMPLANT
KIT HEART LEFT (KITS) ×2 IMPLANT
PACK CARDIAC CATHETERIZATION (CUSTOM PROCEDURE TRAY) ×2 IMPLANT
TRANSDUCER W/STOPCOCK (MISCELLANEOUS) ×2 IMPLANT
TUBING CIL FLEX 10 FLL-RA (TUBING) ×2 IMPLANT
WIRE SAFE-T 1.5MM-J .035X260CM (WIRE) ×2 IMPLANT

## 2015-12-25 NOTE — Consult Note (Signed)
Reason for Consult:CAD s/p NSTEMI Referring Physician: Dr. Burnett Harry [name] is an 55 y.o. male.   HPI: 56 yo male who presents with a cc/o CP  Mr. Torres-Castillo is a 55 yo man with no significant PMH (has not been seen regularly by MD).  He has a family history of diabetes but no history of CAD. He began experiencing chest pain about 5 days ago. The pain radiated to his throat. It usually occurred wit exposure to cold air or liquids. He had the pain on and off until Friday evening when it became severe and his wife finally got him to come to the ED. Pain resolved with NTG.  His ECG showed T wave changes laterally, but he did r/i for an MI with a troponin of 6.97. He was also noted to have a fasting blood sugar of 290.  He remained stable over the weekend and today underwent cardiac catheterization. He was found to have severe 2 vessel CAD with a diffusely diseased RCA and totally occluded circumflex (maybe very small). He had moderate LAD disease.   History reviewed. No pertinent past medical history.  Diagnosed with hypertension and type II diabetes during this admission  History reviewed. No pertinent surgical history.  History reviewed. No pertinent family history. +DM- Brother  Social History:  has no tobacco, alcohol, and drug history on file.  Allergies: No Known Allergies  Medications:  Scheduled: . aspirin EC  81 mg Oral Daily  . atorvastatin  80 mg Oral q1800  . [START ON 12/26/2015] enoxaparin (LOVENOX) injection  40 mg Subcutaneous Q24H  . insulin aspart  0-15 Units Subcutaneous Q4H  . insulin glargine  10 Units Subcutaneous QHS  . insulin starter kit- syringes  1 kit Other Once  . living well with diabetes book- in spanish   Does not apply Once  . metoprolol tartrate  25 mg Oral BID  . sodium chloride flush  3 mL Intravenous Q12H    Results for orders placed or performed during the hospital encounter of 12/23/15 (from the past 48 hour(s))  Heparin  level (unfractionated)     Status: Abnormal   Collection Time: 12/23/15  8:58 PM  Result Value Ref Range   Heparin Unfractionated <0.10 (L) 0.30 - 0.70 IU/mL    Comment:        IF HEPARIN RESULTS ARE BELOW EXPECTED VALUES, AND PATIENT DOSAGE HAS BEEN CONFIRMED, SUGGEST FOLLOW UP TESTING OF ANTITHROMBIN III LEVELS. REPEATED TO VERIFY   Glucose, capillary     Status: Abnormal   Collection Time: 12/23/15  9:47 PM  Result Value Ref Range   Glucose-Capillary 243 (H) 65 - 99 mg/dL   Comment 1 Capillary Specimen   Troponin I     Status: Abnormal   Collection Time: 12/24/15  1:56 AM  Result Value Ref Range   Troponin I 6.97 (HH) <0.03 ng/mL    Comment: CRITICAL RESULT CALLED TO, READ BACK BY AND VERIFIED WITH: CORO J,RN 12/24/15 0250 WAYK   CBC     Status: Abnormal   Collection Time: 12/24/15  3:12 AM  Result Value Ref Range   WBC 7.9 4.0 - 10.5 K/uL    Comment: QUESTIONABLE RESULTS, RECOMMEND RECOLLECT TO VERIFY   RBC 2.89 (L) 4.22 - 5.81 MIL/uL    Comment: QUESTIONABLE RESULTS, RECOMMEND RECOLLECT TO VERIFY   Hemoglobin 8.5 (L) 13.0 - 17.0 g/dL    Comment: QUESTIONABLE RESULTS, RECOMMEND RECOLLECT TO VERIFY   HCT 29.2 (L) 39.0 - 52.0 %  Comment: QUESTIONABLE RESULTS, RECOMMEND RECOLLECT TO VERIFY CORA JUDY RN QUESTIONS THE PATIENTS RESULTS. REQUESTS RECOLLECT.    MCV 101.0 (H) 78.0 - 100.0 fL    Comment: QUESTIONABLE RESULTS, RECOMMEND RECOLLECT TO VERIFY   MCH 29.4 26.0 - 34.0 pg    Comment: QUESTIONABLE RESULTS, RECOMMEND RECOLLECT TO VERIFY   MCHC 29.1 (L) 30.0 - 36.0 g/dL    Comment: QUESTIONABLE RESULTS, RECOMMEND RECOLLECT TO VERIFY   RDW 16.4 (H) 11.5 - 15.5 %    Comment: QUESTIONABLE RESULTS, RECOMMEND RECOLLECT TO VERIFY   Platelets 326 150 - 400 K/uL    Comment: QUESTIONABLE RESULTS, RECOMMEND RECOLLECT TO VERIFY  Lipid panel     Status: Abnormal   Collection Time: 12/24/15  3:12 AM  Result Value Ref Range   Cholesterol 173 0 - 200 mg/dL   Triglycerides 275  (H) <150 mg/dL   HDL 34 (L) >40 mg/dL   Total CHOL/HDL Ratio 5.1 RATIO   VLDL 55 (H) 0 - 40 mg/dL   LDL Cholesterol 84 0 - 99 mg/dL    Comment:        Total Cholesterol/HDL:CHD Risk Coronary Heart Disease Risk Table                     Men   Women  1/2 Average Risk   3.4   3.3  Average Risk       5.0   4.4  2 X Average Risk   9.6   7.1  3 X Average Risk  23.4   11.0        Use the calculated Patient Ratio above and the CHD Risk Table to determine the patient's CHD Risk.        ATP III CLASSIFICATION (LDL):  <100     mg/dL   Optimal  100-129  mg/dL   Near or Above                    Optimal  130-159  mg/dL   Borderline  160-189  mg/dL   High  >190     mg/dL   Very High   Heparin level (unfractionated)     Status: Abnormal   Collection Time: 12/24/15  3:12 AM  Result Value Ref Range   Heparin Unfractionated 0.10 (L) 0.30 - 0.70 IU/mL    Comment:        IF HEPARIN RESULTS ARE BELOW EXPECTED VALUES, AND PATIENT DOSAGE HAS BEEN CONFIRMED, SUGGEST FOLLOW UP TESTING OF ANTITHROMBIN III LEVELS.   CBC     Status: None   Collection Time: 12/24/15  4:47 AM  Result Value Ref Range   WBC 8.7 4.0 - 10.5 K/uL   RBC 4.68 4.22 - 5.81 MIL/uL   Hemoglobin 14.2 13.0 - 17.0 g/dL    Comment: REPEATED TO VERIFY RESULTS VERIFIED VIA RECOLLECT    HCT 42.2 39.0 - 52.0 %   MCV 90.2 78.0 - 100.0 fL    Comment: REPEATED TO VERIFY RESULTS VERIFIED VIA RECOLLECT    MCH 30.3 26.0 - 34.0 pg   MCHC 33.6 30.0 - 36.0 g/dL   RDW 12.9 11.5 - 15.5 %   Platelets 289 150 - 400 K/uL  Hemoglobin A1c     Status: Abnormal   Collection Time: 12/24/15  4:47 AM  Result Value Ref Range   Hgb A1c MFr Bld 11.7 (H) 4.8 - 5.6 %    Comment: (NOTE)         Pre-diabetes: 5.7 -  6.4         Diabetes: >6.4         Glycemic control for adults with diabetes: <7.0    Mean Plasma Glucose 289 mg/dL    Comment: (NOTE) Performed At: [facility] [place], [place] [id number] Lindon Romp  MD NI:[id number]   Glucose, capillary     Status: Abnormal   Collection Time: 12/24/15  7:38 AM  Result Value Ref Range   Glucose-Capillary 249 (H) 65 - 99 mg/dL   Comment 1 Notify RN   Glucose, capillary     Status: Abnormal   Collection Time: 12/24/15 12:27 PM  Result Value Ref Range   Glucose-Capillary 259 (H) 65 - 99 mg/dL   Comment 1 Notify RN   Heparin level (unfractionated)     Status: None   Collection Time: 12/24/15 12:56 PM  Result Value Ref Range   Heparin Unfractionated 0.33 0.30 - 0.70 IU/mL    Comment:        IF HEPARIN RESULTS ARE BELOW EXPECTED VALUES, AND PATIENT DOSAGE HAS BEEN CONFIRMED, SUGGEST FOLLOW UP TESTING OF ANTITHROMBIN III LEVELS.   Lipid panel     Status: Abnormal   Collection Time: 12/24/15 12:56 PM  Result Value Ref Range   Cholesterol 171 0 - 200 mg/dL   Triglycerides 261 (H) <150 mg/dL   HDL 20 (L) >40 mg/dL   Total CHOL/HDL Ratio 8.6 RATIO   VLDL 52 (H) 0 - 40 mg/dL   LDL Cholesterol 99 0 - 99 mg/dL    Comment:        Total Cholesterol/HDL:CHD Risk Coronary Heart Disease Risk Table                     Men   Women  1/2 Average Risk   3.4   3.3  Average Risk       5.0   4.4  2 X Average Risk   9.6   7.1  3 X Average Risk  23.4   11.0        Use the calculated Patient Ratio above and the CHD Risk Table to determine the patient's CHD Risk.        ATP III CLASSIFICATION (LDL):  <100     mg/dL   Optimal  100-129  mg/dL   Near or Above                    Optimal  130-159  mg/dL   Borderline  160-189  mg/dL   High  >190     mg/dL   Very High   Glucose, capillary     Status: Abnormal   Collection Time: 12/24/15  4:44 PM  Result Value Ref Range   Glucose-Capillary 209 (H) 65 - 99 mg/dL   Comment 1 Capillary Specimen   Heparin level (unfractionated)     Status: None   Collection Time: 12/24/15  6:47 PM  Result Value Ref Range   Heparin Unfractionated 0.34 0.30 - 0.70 IU/mL    Comment:        IF HEPARIN RESULTS ARE BELOW EXPECTED  VALUES, AND PATIENT DOSAGE HAS BEEN CONFIRMED, SUGGEST FOLLOW UP TESTING OF ANTITHROMBIN III LEVELS.   Glucose, capillary     Status: Abnormal   Collection Time: 12/24/15  8:51 PM  Result Value Ref Range   Glucose-Capillary 242 (H) 65 - 99 mg/dL   Comment 1 Capillary Specimen   Glucose, capillary  Status: Abnormal   Collection Time: 12/25/15 12:33 AM  Result Value Ref Range   Glucose-Capillary 238 (H) 65 - 99 mg/dL   Comment 1 Capillary Specimen   CBC     Status: None   Collection Time: 12/25/15  2:39 AM  Result Value Ref Range   WBC 8.8 4.0 - 10.5 K/uL   RBC 4.65 4.22 - 5.81 MIL/uL   Hemoglobin 14.0 13.0 - 17.0 g/dL   HCT 41.9 39.0 - 52.0 %   MCV 90.1 78.0 - 100.0 fL   MCH 30.1 26.0 - 34.0 pg   MCHC 33.4 30.0 - 36.0 g/dL   RDW 12.8 11.5 - 15.5 %   Platelets 311 150 - 400 K/uL  Heparin level (unfractionated)     Status: Abnormal   Collection Time: 12/25/15  2:39 AM  Result Value Ref Range   Heparin Unfractionated 0.29 (L) 0.30 - 0.70 IU/mL    Comment:        IF HEPARIN RESULTS ARE BELOW EXPECTED VALUES, AND PATIENT DOSAGE HAS BEEN CONFIRMED, SUGGEST FOLLOW UP TESTING OF ANTITHROMBIN III LEVELS.   Glucose, capillary     Status: Abnormal   Collection Time: 12/25/15  4:31 AM  Result Value Ref Range   Glucose-Capillary 178 (H) 65 - 99 mg/dL   Comment 1 Capillary Specimen   Glucose, capillary     Status: Abnormal   Collection Time: 12/25/15  7:43 AM  Result Value Ref Range   Glucose-Capillary 159 (H) 65 - 99 mg/dL   Comment 1 Notify RN   Glucose, capillary     Status: Abnormal   Collection Time: 12/25/15 12:30 PM  Result Value Ref Range   Glucose-Capillary 185 (H) 65 - 99 mg/dL   Comment 1 Notify RN   Glucose, capillary     Status: Abnormal   Collection Time: 12/25/15  1:12 PM  Result Value Ref Range   Glucose-Capillary 200 (H) 65 - 99 mg/dL   Comment 1 Capillary Specimen   CBC     Status: None   Collection Time: 12/25/15  3:13 PM  Result Value Ref Range    WBC 6.5 4.0 - 10.5 K/uL   RBC 4.59 4.22 - 5.81 MIL/uL   Hemoglobin 13.8 13.0 - 17.0 g/dL   HCT 41.1 39.0 - 52.0 %   MCV 89.5 78.0 - 100.0 fL   MCH 30.1 26.0 - 34.0 pg   MCHC 33.6 30.0 - 36.0 g/dL   RDW 12.8 11.5 - 15.5 %   Platelets 314 150 - 400 K/uL  Creatinine, serum     Status: None   Collection Time: 12/25/15  3:13 PM  Result Value Ref Range   Creatinine, Ser 0.61 0.61 - 1.24 mg/dL   GFR calc non Af Amer >60 >60 mL/min   GFR calc Af Amer >60 >60 mL/min    Comment: (NOTE) The eGFR has been calculated using the CKD EPI equation. This calculation has not been validated in all clinical situations. eGFR's persistently <60 mL/min signify possible Chronic Kidney Disease.   Glucose, capillary     Status: Abnormal   Collection Time: 12/25/15  4:15 PM  Result Value Ref Range   Glucose-Capillary 130 (H) 65 - 99 mg/dL   Comment 1 Notify RN     No results found.  Review of Systems  Constitutional: Positive for malaise/fatigue. Negative for chills and fever.  Eyes: Negative for blurred vision and double vision.  Cardiovascular: Positive for chest pain and orthopnea. Negative for palpitations and claudication.  Gastrointestinal: Negative for blood in stool, nausea and vomiting.  Genitourinary: Negative for dysuria, frequency and urgency.  Musculoskeletal: Positive for back pain and joint pain.  Neurological: Negative for focal weakness, seizures and loss of consciousness.  All other systems reviewed and are negative.  Blood pressure 132/84, pulse 75, temperature 97.6 F (36.4 C), temperature source Oral, resp. rate 17, height 5' 10"  (1.778 m), weight 161 lb 2.5 oz (73.1 kg), SpO2 99 %. Physical Exam  Vitals reviewed. Constitutional: He is oriented to person, place, and time. He appears well-developed and well-nourished. No distress.  HENT:  Head: Normocephalic and atraumatic.  Eyes: Conjunctivae and EOM are normal. Pupils are equal, round, and reactive to light. No scleral  icterus.  Neck: Neck supple. No thyromegaly present.  No carotid bruits  Cardiovascular: Normal rate, regular rhythm, normal heart sounds and intact distal pulses.  Exam reveals no gallop and no friction rub.   No murmur heard. Normal Allen's test on left  Respiratory: Effort normal and breath sounds normal. No respiratory distress. He has no wheezes. He has no rales.  GI: Soft. He exhibits no distension. There is no tenderness.  Musculoskeletal: He exhibits no edema.  Lymphadenopathy:    He has no cervical adenopathy.  Neurological: He is alert and oriented to person, place, and time. No cranial nerve deficit.  Motor intact  Skin: Skin is warm and dry.   Conclusion     Mid RCA lesion, 60 %stenosed.  Dist RCA lesion, 85 %stenosed.  RPDA lesion, 75 %stenosed.  3rd RPLB lesion, 100 %stenosed.  Mid LAD to Dist LAD lesion, 65 %stenosed.  Ost 1st Diag to 1st Diag lesion, 75 %stenosed.  1st Diag lesion, 95 %stenosed.  Ramus lesion, 95 %stenosed.  Prox Cx to Mid Cx lesion, 100 %stenosed.  The left ventricular ejection fraction is 45-50% by visual estimate.    Severe diffuse multivessel coronary disease including total occlusion of the proximal circumflex (feels left to left and right-to-left by collaterals), severe multifocal disease throughout the mid, distal, and continuation of the RCA.  LAD is diffusely diseased with up to 60-70% stenosis in the midsegment. A large diagonal also has segmental severe stenosis.  Left ventricular systolic dysfunction with anterior wall hypokinesis. EF 40-50%.   RECOMMENDATIONS:   Aggressive risk factor modification  Medical therapy for CAD.  If unacceptable symptoms, CABG would be needed.  Have initially started Plavix but on second thought we'll discontinue this medication. No doses were given in the cath lab.    I personally reviewed the cath images and concur with the findings noted above  Assessment/Plan:  55 yo man  with no significant past medical history in part due to limited access to health care who presents with a non-STEMI and has been found to have severe 2 vessel CAD and moderate LAD involvement.  There is a question as to whether the LAD disease is hemodynamically significant. I think the best option is to perform and FFR assessment on the LAD. If there is impairment of flow CABG would be best option. Otherwise, I would agree with a trial of medical management.  Discussed with Dr. Gailen Shelter 12/25/2015, 6:14 PM

## 2015-12-25 NOTE — Progress Notes (Addendum)
PROGRESS NOTE    Kurt Mathis  [id number] DOB: April 03, 1961 DOA: 12/23/2015 PCP: Sanda Klein, MD    Brief Narrative:  55 y/o presengint with Non Stemi and newly diagnosed DM.    Assessment & Plan:   Active Problems:   NSTEMI (non-ST elevated myocardial infarction) [facility]) - Cardiology on board and recommending catheterization/coronary angiographya nd resvascluarization as indicated. - addendum: cardiology to decide when patient to transition to home.    Diabetes mellitus type 2, uncontrolled, with complications ([place]) - Wife reports that she is waiting to speak to dietitian to learn about the diabetic diet - For now continue current insulin regimen. Last reported blood sugar at 745 am 159    Essential hypertension - currently normotensive on metoprolol   DVT prophylaxis: On heparin Code Status: Full Family Communication: None at bedside Disposition Plan: Pending improvement in condition   Consultants:   Cardiology   Procedures: None   Antimicrobials: None   Subjective: Pt has no new complaints. No acute issues overnight. Would like more diabetic diet teaching.  Objective: Vitals:   12/25/15 0030 12/25/15 0424 12/25/15 0700 12/25/15 0744  BP: 117/78 124/80  125/80  Pulse: 74 82 70 73  Resp: 18 (!) 21 10 17   Temp: 97.5 F (36.4 C) 97.7 F (36.5 C)  98.2 F (36.8 C)  TempSrc: Oral Oral  Oral  SpO2: 97% 98% 98% 99%  Weight:  73.1 kg (161 lb 2.5 oz)    Height:        Intake/Output Summary (Last 24 hours) at 12/25/15 0759 Last data filed at 12/25/15 0700  Gross per 24 hour  Intake          1321.29 ml  Output                1 ml  Net          1320.29 ml   Filed Weights   12/23/15 1509 12/24/15 0338 12/25/15 0424  Weight: 72.4 kg (159 lb 9.8 oz) 70.6 kg (155 lb 10.3 oz) 73.1 kg (161 lb 2.5 oz)    Examination:  General exam: Appears calm and comfortable, in nad. Respiratory system: Clear to auscultation. Respiratory effort  normal. Cardiovascular system: S1 & S2 heard, RRR. No rubs, no murmurs Gastrointestinal system: Abdomen is nondistended, soft and nontender. No organomegaly or masses felt. Normal bowel sounds heard. Central nervous system: Alert and oriented. No focal neurological deficits. Extremities: Symmetric 5 x 5 power. Skin: No rashes, lesions or ulcers Psychiatry: Judgement and insight appear normal. Mood & affect appropriate.   Data Reviewed: I have personally reviewed following labs and imaging studies  CBC:  Recent Labs Lab 12/23/15 1100 12/24/15 0312 12/24/15 0447 12/25/15 0239  WBC 7.8 7.9 8.7 8.8  HGB 15.4 8.5* 14.2 14.0  HCT 44.5 29.2* 42.2 41.9  MCV 88.5 101.0* 90.2 90.1  PLT 288 326 289 [id number]   Basic Metabolic Panel:  Recent Labs Lab 12/23/15 1100 12/23/15 1200  NA 136 138  K 4.0 3.9  CL 101 103  CO2 26 25  GLUCOSE 309* 290*  BUN 13 13  CREATININE 0.74 0.64  CALCIUM 9.3 8.8*  MG  --  1.8   GFR: Estimated Creatinine Clearance: 107.7 mL/min (by C-G formula based on SCr of 0.64 mg/dL). Liver Function Tests:  Recent Labs Lab 12/23/15 1200  AST 48*  ALT 40  ALKPHOS 74  BILITOT 0.7  PROT 7.2  ALBUMIN 3.9   No results for input(s): LIPASE, AMYLASE in the last 168  hours. No results for input(s): AMMONIA in the last 168 hours. Coagulation Profile:  Recent Labs Lab 12/23/15 1200  INR 1.07   Cardiac Enzymes:  Recent Labs Lab 12/23/15 1200 12/24/15 0156  TROPONINI 5.93* 6.97*   BNP (last 3 results) No results for input(s): PROBNP in the last 8760 hours. HbA1C:  Recent Labs  12/23/15 1058  HGBA1C 12.0*   CBG:  Recent Labs Lab 12/24/15 1644 12/24/15 2051 12/25/15 0033 12/25/15 0431 12/25/15 0743  GLUCAP 209* 242* 238* 178* 159*   Lipid Profile:  Recent Labs  12/24/15 0312 12/24/15 1256  CHOL 173 171  HDL 34* 20*  LDLCALC 84 99  TRIG 275* 261*  CHOLHDL 5.1 8.6   Thyroid Function Tests:  Recent Labs  12/23/15 1200  TSH 1.550    Anemia Panel: No results for input(s): VITAMINB12, FOLATE, FERRITIN, TIBC, IRON, RETICCTPCT in the last 72 hours. Sepsis Labs: No results for input(s): PROCALCITON, LATICACIDVEN in the last 168 hours.  Recent Results (from the past 240 hour(s))  MRSA PCR Screening     Status: None   Collection Time: 12/23/15  3:30 PM  Result Value Ref Range Status   MRSA by PCR NEGATIVE NEGATIVE Final    Comment:        The GeneXpert MRSA Assay (FDA approved for NASAL specimens only), is one component of a comprehensive MRSA colonization surveillance program. It is not intended to diagnose MRSA infection nor to guide or monitor treatment for MRSA infections.       Radiology Studies: Dg Chest 2 View  Result Date: 12/23/2015 CLINICAL DATA:  Tachycardia and chest pain for 3 days. EXAM: CHEST  2 VIEW COMPARISON:  None. FINDINGS: The cardiomediastinal silhouette is unremarkable. There is no evidence of focal airspace disease, pulmonary edema, suspicious pulmonary nodule/mass, pleural effusion, or pneumothorax. No acute bony abnormalities are identified. IMPRESSION: No active cardiopulmonary disease. Electronically Signed   By: Margarette Canada M.D.   On: 12/23/2015 11:50    Scheduled Meds: . aspirin EC  81 mg Oral Daily  . atorvastatin  80 mg Oral q1800  . insulin aspart  0-15 Units Subcutaneous Q4H  . insulin glargine  10 Units Subcutaneous QHS  . metoprolol tartrate  25 mg Oral BID  . sodium chloride flush  3 mL Intravenous Q12H   Continuous Infusions: . sodium chloride 1 mL/kg/hr (12/25/15 0700)  . heparin 1,300 Units/hr (12/24/15 2101)     LOS: 2 days    Time spent: > 35 minutes    Velvet Bathe, MD [facility] Pager [phone]  If 7PM-7AM, please contact night-coverage [email or web address] Password [email or web address] 12/25/2015, 7:59 AM

## 2015-12-25 NOTE — Progress Notes (Signed)
Pt and wife watch cardiac cath video. Pt had mixed feelings about procedure and talked with wife.  Questions answered.  Consent obtained.  Pt and wife refused to watch additional Diabetes video's at this time.  Will continue to monitor Saunders Revel T

## 2015-12-25 NOTE — Progress Notes (Signed)
This RN communicated with Chat RN to d/c air from Right TR band and document

## 2015-12-25 NOTE — H&P (View-Only) (Signed)
Patient Name: Kurt Mathis Date of Encounter: 12/25/2015  Active Problems:   NSTEMI (non-ST elevated myocardial infarction) ([place])   Diabetes mellitus type 2, uncontrolled, with complications ([place])   Essential hypertension   Length of Stay: 2  SUBJECTIVE  No angina overnight. He says he is ready for heart cath. Echo shows EF 40-45%, inferior and inferolateral wall motion abnormalities.  CURRENT MEDS . aspirin EC  81 mg Oral Daily  . atorvastatin  80 mg Oral q1800  . insulin aspart  0-15 Units Subcutaneous Q4H  . insulin glargine  10 Units Subcutaneous QHS  . metoprolol tartrate  25 mg Oral BID  . sodium chloride flush  3 mL Intravenous Q12H    OBJECTIVE   Intake/Output Summary (Last 24 hours) at 12/25/15 0904 Last data filed at 12/25/15 0700  Gross per 24 hour  Intake          1295.29 ml  Output                1 ml  Net          1294.29 ml   Filed Weights   12/23/15 1509 12/24/15 0338 12/25/15 0424  Weight: 72.4 kg (159 lb 9.8 oz) 70.6 kg (155 lb 10.3 oz) 73.1 kg (161 lb 2.5 oz)    PHYSICAL EXAM Vitals:   12/25/15 0030 12/25/15 0424 12/25/15 0700 12/25/15 0744  BP: 117/78 124/80  125/80  Pulse: 74 82 70 73  Resp: 18 (!) 21 10 17   Temp: 97.5 F (36.4 C) 97.7 F (36.5 C)  98.2 F (36.8 C)  TempSrc: Oral Oral  Oral  SpO2: 97% 98% 98% 99%  Weight:  73.1 kg (161 lb 2.5 oz)    Height:       General: Alert, oriented x3, no distress Head: no evidence of trauma, PERRL, EOMI, no exophtalmos or lid lag, no myxedema, no xanthelasma; normal ears, nose and oropharynx Neck: normal jugular venous pulsations and no hepatojugular reflux; brisk carotid pulses without delay and no carotid bruits Chest: clear to auscultation, no signs of consolidation by percussion or palpation, normal fremitus, symmetrical and full respiratory excursions Cardiovascular: normal position and quality of the apical impulse, regular rhythm, normal first and second heart sounds, no rubs or  gallops, no murmur Abdomen: no tenderness or distention, no masses by palpation, no abnormal pulsatility or arterial bruits, normal bowel sounds, no hepatosplenomegaly Extremities: no clubbing, cyanosis or edema; 2+ radial, ulnar and brachial pulses bilaterally; 2+ right femoral, posterior tibial and dorsalis pedis pulses; 2+ left femoral, posterior tibial and dorsalis pedis pulses; no subclavian or femoral bruits Neurological: grossly nonfocal  LABS  CBC  Recent Labs  12/24/15 0447 12/25/15 0239  WBC 8.7 8.8  HGB 14.2 14.0  HCT 42.2 41.9  MCV 90.2 90.1  PLT 289 [id number]   Basic Metabolic Panel  Recent Labs  12/23/15 1100 12/23/15 1200  NA 136 138  K 4.0 3.9  CL 101 103  CO2 26 25  GLUCOSE 309* 290*  BUN 13 13  CREATININE 0.74 0.64  CALCIUM 9.3 8.8*  MG  --  1.8   Liver Function Tests  Recent Labs  12/23/15 1200  AST 48*  ALT 40  ALKPHOS 74  BILITOT 0.7  PROT 7.2  ALBUMIN 3.9   No results for input(s): LIPASE, AMYLASE in the last 72 hours. Cardiac Enzymes  Recent Labs  12/23/15 1200 12/24/15 0156  TROPONINI 5.93* 6.97*   BNP Invalid input(s): POCBNP D-Dimer No results for input(s): DDIMER  in the last 72 hours. Hemoglobin A1C  Recent Labs  12/23/15 1058  HGBA1C 12.0*   Fasting Lipid Panel  Recent Labs  12/24/15 1256  CHOL 171  HDL 20*  LDLCALC 99  TRIG 261*  CHOLHDL 8.6   Thyroid Function Tests  Recent Labs  12/23/15 1200  TSH 1.550    Radiology Studies Imaging results have been reviewed and Dg Chest 2 View  Result Date: 12/23/2015 CLINICAL DATA:  Tachycardia and chest pain for 3 days. EXAM: CHEST  2 VIEW COMPARISON:  None. FINDINGS: The cardiomediastinal silhouette is unremarkable. There is no evidence of focal airspace disease, pulmonary edema, suspicious pulmonary nodule/mass, pleural effusion, or pneumothorax. No acute bony abnormalities are identified. IMPRESSION: No active cardiopulmonary disease. Electronically Signed   By:  Margarette Canada M.D.   On: 12/23/2015 11:50    TELE NSR  ECG NSR, slight worsening of I, aVL, V5-V6  ASSESSMENT AND PLAN  1. NSTEMI: no angina. Echo shows inferior and inferolateral wall motion abnormality and mildly reduced LVEF. ECG is more compatible with a circumflex lesion (dominant LCX?). Coronary angio +/- PCI stent today. This procedure has been fully reviewed with the patient and written informed consent has been obtained. 2. DM: newly diagnosed. Appreciate hospitalist assistance to initiate medical therapy and a follow up plan. Note that he may not be able to afford expensive meds such as Lantus. Diabetes education pending. 3. HLP:LDL around 100. Plan high dose atorvastatin for a year and reevaluate long term lipid lowering therapy based on follow up results. Diet and exercise discussed. 4. Social issues:  He reports being uninsured at this time after losing his job. Unclear if he can get COBRA. He has been married to his American citizen wife for 11 years, but his own immigration status is not settled. Will need assistance from the case manager/billing office. It is highly likely he will get a stent on this admission and will need access to antiplatelet therapy to avoid readmission and serious complications.  Sanda Klein, MD, [facility] [phone] office [phone] pager 12/25/2015 9:04 AM

## 2015-12-25 NOTE — Progress Notes (Signed)
Brief Nutrition Education Note  RD consulted for DM diet education. Pt with new onset type 2 DM.   Case discussed with RN; pt currently NPO for cardiac cath and will be taken down for procedure soon. Pt has DM videos ordered, but has not watched yet. RN confirms that pt refuses any attempts for education unless pt wife is present. RN confirms pt wife is not present at this time; suggests holding off on education until tomorrow (12/26/15) when wife is present. DM coordinator also following.   RD will continue to follow for education needs.   Ilanna Deihl A. Jimmye Norman, RD, LDN, CDE Pager: [phone] After hours Pager: [phone]

## 2015-12-25 NOTE — Interval H&P Note (Signed)
History and Physical Interval Note: Cath Lab Visit (complete for each Cath Lab visit)  Clinical Evaluation Leading to the Procedure:   ACS: Yes.    Non-ACS:    Anginal Classification: CCS Mathis  Anti-ischemic medical therapy: Maximal Therapy (2 or more classes of medications)  Non-Invasive Test Results: No non-invasive testing performed  Prior CABG: No previous CABG       12/25/2015 1:39 PM  Kurt Mathis  has presented today for surgery, with the diagnosis of NSTEMI  The various methods of treatment have been discussed with the patient and family. After consideration of risks, benefits and other options for treatment, the patient has consented to  Procedure(s): Left Heart Cath and Coronary Angiography (N/A) as a surgical intervention .  The patient's history has been reviewed, patient examined, no change in status, stable for surgery.  I have reviewed the patient's chart and labs.  Questions were answered to the patient's satisfaction.     Kurt Mathis

## 2015-12-25 NOTE — Progress Notes (Signed)
ANTICOAGULATION CONSULT NOTE - Follow Up Consult  Pharmacy Consult for Heparin Indication: chest pain/ACS  No Known Allergies  Patient Measurements: Height: 5\' 10"  (177.8 cm) Weight: 161 lb 2.5 oz (73.1 kg) IBW/kg (Calculated) : 73  Vital Signs: Temp: 98.2 F (36.8 C) (09/18 0744) Temp Source: Oral (09/18 0744) BP: 125/80 (09/18 0744) Pulse Rate: 73 (09/18 0744)  Labs:  Recent Labs  12/23/15 1100 12/23/15 1200  12/24/15 0156 12/24/15 0312 12/24/15 0447 12/24/15 1256 12/24/15 1847 12/25/15 0239  HGB 15.4  --   --   --  8.5* 14.2  --   --  14.0  HCT 44.5  --   --   --  29.2* 42.2  --   --  41.9  PLT 288  --   --   --  326 289  --   --  311  LABPROT  --  13.9  --   --   --   --   --   --   --   INR  --  1.07  --   --   --   --   --   --   --   HEPARINUNFRC  --   --   < >  --  0.10*  --  0.33 0.34 0.29*  CREATININE 0.74 0.64  --   --   --   --   --   --   --   TROPONINI  --  5.93*  --  6.97*  --   --   --   --   --   < > = values in this interval not displayed.  Estimated Creatinine Clearance: 107.7 mL/min (by C-G formula based on SCr of 0.64 mg/dL).   Medications:  Heparin @ 1300 units/hr  Assessment: 55yom continues on heparin for NSTEMI with plan for cath today. Heparin level is just slightly below goal at 0.29. CBC stable. No bleeding.  Goal of Therapy:  Heparin level 0.3-0.7 units/ml Monitor platelets by anticoagulation protocol: Yes   Plan:  1) Increase heparin to 1350 units/hr 2) Follow up after cath  Deboraha Sprang 12/25/2015,10:08 AM

## 2015-12-25 NOTE — CV Procedure (Signed)
   Severe diffuse coronary disease with total occlusion of the proximal circumflex, multiple lesions throughout the proximal mid and distal right coronary. Diffuse but noncritical LAD disease.  Anterolateral wall motion abnormality on ventriculogram.  With LAD disease being noncritical, would recommend initially, medical therapy. If uncontrollable symptoms would consider multiple stents in the RCA and PDA and possible CTO circumflex. Ultimately coronary bypass grafting will need to be done when LAD progresses to clinical significance.

## 2015-12-25 NOTE — Care Management Note (Addendum)
Case Management Note  Patient Details  Name: Kurt Mathis MRN: [id number] Date of Birth: Sep 27, 1960  Subjective/Objective:  Adm w new dm, nstemi                  Action/Plan:lives w wife and son  Expected Discharge Date:                  Expected Discharge Plan:  Home/Self Care  In-House Referral:     Discharge [facility], CM Consult  Post Acute Care Choice:    Choice offered to:     DME Arranged:    DME Agency:     HH Arranged:    HH Agency:     Status of Service:     If discussed at [employer], dates discussed:    Additional Comments: gave pt inform on [place] [facility].  Have contacted [place] [facility], [facility]ick[facility], [facility] and no one taking referrals. Have pt [facility] but may be end of October. Have left message w [facility] to see if they are taking any new pt's. Have not been able to get pt w a pcp clinic .  Lacretia Leigh, RN 12/25/2015, 10:40 AM

## 2015-12-25 NOTE — Progress Notes (Signed)
Patient Name: Kurt Mathis Date of Encounter: 12/25/2015  Active Problems:   NSTEMI (non-ST elevated myocardial infarction) ([place])   Diabetes mellitus type 2, uncontrolled, with complications ([place])   Essential hypertension   Length of Stay: 2  SUBJECTIVE  No angina overnight. He says he is ready for heart cath. Echo shows EF 40-45%, inferior and inferolateral wall motion abnormalities.  CURRENT MEDS . aspirin EC  81 mg Oral Daily  . atorvastatin  80 mg Oral q1800  . insulin aspart  0-15 Units Subcutaneous Q4H  . insulin glargine  10 Units Subcutaneous QHS  . metoprolol tartrate  25 mg Oral BID  . sodium chloride flush  3 mL Intravenous Q12H    OBJECTIVE   Intake/Output Summary (Last 24 hours) at 12/25/15 0904 Last data filed at 12/25/15 0700  Gross per 24 hour  Intake          1295.29 ml  Output                1 ml  Net          1294.29 ml   Filed Weights   12/23/15 1509 12/24/15 0338 12/25/15 0424  Weight: 72.4 kg (159 lb 9.8 oz) 70.6 kg (155 lb 10.3 oz) 73.1 kg (161 lb 2.5 oz)    PHYSICAL EXAM Vitals:   12/25/15 0030 12/25/15 0424 12/25/15 0700 12/25/15 0744  BP: 117/78 124/80  125/80  Pulse: 74 82 70 73  Resp: 18 (!) 21 10 17   Temp: 97.5 F (36.4 C) 97.7 F (36.5 C)  98.2 F (36.8 C)  TempSrc: Oral Oral  Oral  SpO2: 97% 98% 98% 99%  Weight:  73.1 kg (161 lb 2.5 oz)    Height:       General: Alert, oriented x3, no distress Head: no evidence of trauma, PERRL, EOMI, no exophtalmos or lid lag, no myxedema, no xanthelasma; normal ears, nose and oropharynx Neck: normal jugular venous pulsations and no hepatojugular reflux; brisk carotid pulses without delay and no carotid bruits Chest: clear to auscultation, no signs of consolidation by percussion or palpation, normal fremitus, symmetrical and full respiratory excursions Cardiovascular: normal position and quality of the apical impulse, regular rhythm, normal first and second heart sounds, no rubs or  gallops, no murmur Abdomen: no tenderness or distention, no masses by palpation, no abnormal pulsatility or arterial bruits, normal bowel sounds, no hepatosplenomegaly Extremities: no clubbing, cyanosis or edema; 2+ radial, ulnar and brachial pulses bilaterally; 2+ right femoral, posterior tibial and dorsalis pedis pulses; 2+ left femoral, posterior tibial and dorsalis pedis pulses; no subclavian or femoral bruits Neurological: grossly nonfocal  LABS  CBC  Recent Labs  12/24/15 0447 12/25/15 0239  WBC 8.7 8.8  HGB 14.2 14.0  HCT 42.2 41.9  MCV 90.2 90.1  PLT 289 [id number]   Basic Metabolic Panel  Recent Labs  12/23/15 1100 12/23/15 1200  NA 136 138  K 4.0 3.9  CL 101 103  CO2 26 25  GLUCOSE 309* 290*  BUN 13 13  CREATININE 0.74 0.64  CALCIUM 9.3 8.8*  MG  --  1.8   Liver Function Tests  Recent Labs  12/23/15 1200  AST 48*  ALT 40  ALKPHOS 74  BILITOT 0.7  PROT 7.2  ALBUMIN 3.9   No results for input(s): LIPASE, AMYLASE in the last 72 hours. Cardiac Enzymes  Recent Labs  12/23/15 1200 12/24/15 0156  TROPONINI 5.93* 6.97*   BNP Invalid input(s): POCBNP D-Dimer No results for input(s): DDIMER  in the last 72 hours. Hemoglobin A1C  Recent Labs  12/23/15 1058  HGBA1C 12.0*   Fasting Lipid Panel  Recent Labs  12/24/15 1256  CHOL 171  HDL 20*  LDLCALC 99  TRIG 261*  CHOLHDL 8.6   Thyroid Function Tests  Recent Labs  12/23/15 1200  TSH 1.550    Radiology Studies Imaging results have been reviewed and Dg Chest 2 View  Result Date: 12/23/2015 CLINICAL DATA:  Tachycardia and chest pain for 3 days. EXAM: CHEST  2 VIEW COMPARISON:  None. FINDINGS: The cardiomediastinal silhouette is unremarkable. There is no evidence of focal airspace disease, pulmonary edema, suspicious pulmonary nodule/mass, pleural effusion, or pneumothorax. No acute bony abnormalities are identified. IMPRESSION: No active cardiopulmonary disease. Electronically Signed   By:  Margarette Canada M.D.   On: 12/23/2015 11:50    TELE NSR  ECG NSR, slight worsening of I, aVL, V5-V6  ASSESSMENT AND PLAN  1. NSTEMI: no angina. Echo shows inferior and inferolateral wall motion abnormality and mildly reduced LVEF. ECG is more compatible with a circumflex lesion (dominant LCX?). Coronary angio +/- PCI stent today. This procedure has been fully reviewed with the patient and written informed consent has been obtained. 2. DM: newly diagnosed. Appreciate hospitalist assistance to initiate medical therapy and a follow up plan. Note that he may not be able to afford expensive meds such as Lantus. Diabetes education pending. 3. HLP:LDL around 100. Plan high dose atorvastatin for a year and reevaluate long term lipid lowering therapy based on follow up results. Diet and exercise discussed. 4. Social issues:  He reports being uninsured at this time after losing his job. Unclear if he can get COBRA. He has been married to his American citizen wife for 11 years, but his own immigration status is not settled. Will need assistance from the case manager/billing office. It is highly likely he will get a stent on this admission and will need access to antiplatelet therapy to avoid readmission and serious complications.  Sanda Klein, MD, [facility] [phone] office [phone] pager 12/25/2015 9:04 AM

## 2015-12-25 NOTE — Progress Notes (Signed)
TR Band d/c per protocol, air released per protocol, pt R rad without brusing, bleeding, or hemotoma, dsg placed with tegaderm, pt educated to call if any bleeding, swelling, or concerns, nursing will cont to monitor

## 2015-12-26 ENCOUNTER — Other Ambulatory Visit: Payer: Self-pay | Admitting: *Deleted

## 2015-12-26 ENCOUNTER — Encounter (HOSPITAL_COMMUNITY)
Admission: EM | Disposition: A | Discharge status: Home / Self Care | Payer: Self-pay | Source: Home / Self Care | Attending: Cardiovascular Disease

## 2015-12-26 ENCOUNTER — Encounter (HOSPITAL_COMMUNITY): Payer: Self-pay | Admitting: Interventional Cardiology

## 2015-12-26 DIAGNOSIS — I2510 Atherosclerotic heart disease of native coronary artery without angina pectoris: Secondary | ICD-10-CM

## 2015-12-26 DIAGNOSIS — I255 Ischemic cardiomyopathy: Secondary | ICD-10-CM

## 2015-12-26 DIAGNOSIS — I519 Heart disease, unspecified: Secondary | ICD-10-CM

## 2015-12-26 HISTORY — PX: CARDIAC CATHETERIZATION: SHX172

## 2015-12-26 LAB — BASIC METABOLIC PANEL
ANION GAP: 7 (ref 5–15)
BUN: 9 mg/dL (ref 6–20)
CALCIUM: 9.5 mg/dL (ref 8.9–10.3)
CO2: 28 mmol/L (ref 22–32)
Chloride: 103 mmol/L (ref 101–111)
Creatinine, Ser: 0.74 mg/dL (ref 0.61–1.24)
GFR calc Af Amer: >60 mL/min (ref >60)
GFR calc non Af Amer: >60 mL/min (ref >60)
GLUCOSE: 148 mg/dL — AB (ref 65–99)
POTASSIUM: 4.1 mmol/L (ref 3.5–5.1)
Sodium: 138 mmol/L (ref 135–145)

## 2015-12-26 LAB — GLUCOSE, CAPILLARY
GLUCOSE-CAPILLARY: 122 mg/dL — AB (ref 65–99)
GLUCOSE-CAPILLARY: 174 mg/dL — AB (ref 65–99)
Glucose-Capillary: 109 mg/dL — ABNORMAL HIGH (ref 65–99)
Glucose-Capillary: 137 mg/dL — ABNORMAL HIGH (ref 65–99)
Glucose-Capillary: 148 mg/dL — ABNORMAL HIGH (ref 65–99)
Glucose-Capillary: 209 mg/dL — ABNORMAL HIGH (ref 65–99)

## 2015-12-26 LAB — CBC
HEMATOCRIT: 44.3 % (ref 39.0–52.0)
HEMOGLOBIN: 15.1 g/dL (ref 13.0–17.0)
MCH: 30.6 pg (ref 26.0–34.0)
MCHC: 34.1 g/dL (ref 30.0–36.0)
MCV: 89.7 fL (ref 78.0–100.0)
Platelets: 357 10*3/uL (ref 150–400)
RBC: 4.94 MIL/uL (ref 4.22–5.81)
RDW: 12.8 % (ref 11.5–15.5)
WBC: 7.5 10*3/uL (ref 4.0–10.5)

## 2015-12-26 LAB — POCT ACTIVATED CLOTTING TIME: ACTIVATED CLOTTING TIME: 367 s

## 2015-12-26 SURGERY — INTRAVASCULAR PRESSURE WIRE/FFR STUDY
Anesthesia: LOCAL

## 2015-12-26 MED ORDER — ADENOSINE 12 MG/4ML IV SOLN
INTRAVENOUS | Status: AC
  Filled 2015-12-26: qty 16

## 2015-12-26 MED ORDER — HEPARIN (PORCINE) IN NACL 2-0.9 UNIT/ML-% IJ SOLN
INTRAMUSCULAR | Status: DC | PRN
  Administered 2015-12-26: 1000 mL

## 2015-12-26 MED ORDER — LIDOCAINE HCL (PF) 1 % IJ SOLN
INTRAMUSCULAR | Status: AC
  Filled 2015-12-26: qty 30

## 2015-12-26 MED ORDER — SODIUM CHLORIDE 0.9 % WEIGHT BASED INFUSION: 3.0000 mL/kg/h | INTRAVENOUS | Status: DC

## 2015-12-26 MED ORDER — ADENOSINE (DIAGNOSTIC) 140MCG/KG/MIN
INTRAVENOUS | Status: DC | PRN
  Administered 2015-12-26: 140 ug/kg/min via INTRAVENOUS

## 2015-12-26 MED ORDER — HEPARIN SODIUM (PORCINE) 1000 UNIT/ML IJ SOLN
INTRAMUSCULAR | Status: AC
  Filled 2015-12-26: qty 1

## 2015-12-26 MED ORDER — HEPARIN SODIUM (PORCINE) 1000 UNIT/ML IJ SOLN
INTRAMUSCULAR | Status: DC | PRN
  Administered 2015-12-26: 10000 [IU] via INTRAVENOUS

## 2015-12-26 MED ORDER — VERAPAMIL HCL 2.5 MG/ML IV SOLN
INTRAVENOUS | Status: DC | PRN
  Administered 2015-12-26: 10 mL via INTRA_ARTERIAL

## 2015-12-26 MED ORDER — SODIUM CHLORIDE 0.9% FLUSH: 3.0000 mL | Freq: Two times a day (BID) | INTRAVENOUS | Status: DC

## 2015-12-26 MED ORDER — SODIUM CHLORIDE 0.9 % WEIGHT BASED INFUSION: 1.0000 mL/kg/h | INTRAVENOUS | Status: DC

## 2015-12-26 MED ORDER — LIDOCAINE HCL (PF) 1 % IJ SOLN
INTRAMUSCULAR | Status: DC | PRN
Start: 2015-12-26 — End: 2015-12-26
  Administered 2015-12-26: 2 mL

## 2015-12-26 MED ORDER — HEPARIN (PORCINE) IN NACL 100-0.45 UNIT/ML-% IJ SOLN
1350.0000 [IU]/h | INTRAMUSCULAR | Status: DC
  Administered 2015-12-27 (×2): 1350 [IU]/h via INTRAVENOUS
  Filled 2015-12-26 (×2): qty 250

## 2015-12-26 MED ORDER — FENTANYL CITRATE (PF) 100 MCG/2ML IJ SOLN
INTRAMUSCULAR | Status: AC
  Filled 2015-12-26: qty 2

## 2015-12-26 MED ORDER — SODIUM CHLORIDE 0.9% FLUSH: 3.0000 mL | INTRAVENOUS | Status: DC | PRN

## 2015-12-26 MED ORDER — HEPARIN (PORCINE) IN NACL 2-0.9 UNIT/ML-% IJ SOLN
INTRAMUSCULAR | Status: AC
  Filled 2015-12-26: qty 1000

## 2015-12-26 MED ORDER — MIDAZOLAM HCL 2 MG/2ML IJ SOLN
INTRAMUSCULAR | Status: DC | PRN
  Administered 2015-12-26: 2 mg via INTRAVENOUS

## 2015-12-26 MED ORDER — SODIUM CHLORIDE 0.9 % IV SOLN
INTRAVENOUS | Status: AC
  Administered 2015-12-26: 75 mL/h via INTRAVENOUS

## 2015-12-26 MED ORDER — SODIUM CHLORIDE 0.9 % IV SOLN: 250.0000 mL | INTRAVENOUS | Status: DC | PRN

## 2015-12-26 MED ORDER — FENTANYL CITRATE (PF) 100 MCG/2ML IJ SOLN
INTRAMUSCULAR | Status: DC | PRN
  Administered 2015-12-26: 25 ug via INTRAVENOUS

## 2015-12-26 MED ORDER — IOPAMIDOL (ISOVUE-370) INJECTION 76%
INTRAVENOUS | Status: DC | PRN
  Administered 2015-12-26: 40 mL via INTRA_ARTERIAL

## 2015-12-26 MED ORDER — MIDAZOLAM HCL 2 MG/2ML IJ SOLN
INTRAMUSCULAR | Status: AC
  Filled 2015-12-26: qty 2

## 2015-12-26 MED ORDER — LIVING WELL WITH DIABETES BOOK
Freq: Once | Status: AC
Start: 2015-12-26 — End: 2015-12-26
  Administered 2015-12-26: 1
  Filled 2015-12-26: qty 1

## 2015-12-26 MED ORDER — LISINOPRIL 2.5 MG PO TABS
2.5000 mg | ORAL_TABLET | Freq: Every day | ORAL | Status: DC
  Administered 2015-12-26 – 2015-12-31 (×6): 2.5 mg via ORAL
  Filled 2015-12-26 (×6): qty 1

## 2015-12-26 SURGICAL SUPPLY — 11 items
CATH LAUNCHER 5F JL3 (CATHETERS) ×2 IMPLANT
CATH VISTA GUIDE 6FR XBLAD3.5 (CATHETERS) ×3 IMPLANT
CATHETER LAUNCHER 5F JL3 (CATHETERS) ×3
DEVICE RAD COMP TR BAND LRG (VASCULAR PRODUCTS) ×3 IMPLANT
GLIDESHEATH SLEND SS 6F .021 (SHEATH) ×3 IMPLANT
GUIDEWIRE PRESSURE COMET II (WIRE) ×3 IMPLANT
KIT ESSENTIALS PG (KITS) ×3 IMPLANT
KIT HEART LEFT (KITS) ×3 IMPLANT
PACK CARDIAC CATHETERIZATION (CUSTOM PROCEDURE TRAY) ×3 IMPLANT
TRANSDUCER W/STOPCOCK (MISCELLANEOUS) ×3 IMPLANT
WIRE SAFE-T 1.5MM-J .035X260CM (WIRE) ×3 IMPLANT

## 2015-12-26 NOTE — Progress Notes (Addendum)
Patient Name: Kurt Mathis Date of Encounter: 12/26/2015  Principal Problem:   NSTEMI (non-ST elevated myocardial infarction) [facility]) Active Problems:   Diabetes mellitus type 2, uncontrolled, with complications ([place])   Essential hypertension   Length of Stay: 3  SUBJECTIVE  No angina, no dyspnea. Glycemic control improving. BP OK.  CURRENT MEDS . aspirin EC  81 mg Oral Daily  . atorvastatin  80 mg Oral q1800  . enoxaparin (LOVENOX) injection  40 mg Subcutaneous Q24H  . insulin aspart  0-15 Units Subcutaneous Q4H  . insulin glargine  10 Units Subcutaneous QHS  . metoprolol tartrate  25 mg Oral BID  . sodium chloride flush  3 mL Intravenous Q12H    OBJECTIVE   Intake/Output Summary (Last 24 hours) at 12/26/15 1035 Last data filed at 12/26/15 0942  Gross per 24 hour  Intake          1305.08 ml  Output              251 ml  Net          1054.08 ml   Filed Weights   12/24/15 0338 12/25/15 0424 12/26/15 0500  Weight: 70.6 kg (155 lb 10.3 oz) 73.1 kg (161 lb 2.5 oz) 73.4 kg (161 lb 13.1 oz)    PHYSICAL EXAM Vitals:   12/25/15 2100 12/26/15 0009 12/26/15 0500 12/26/15 0700  BP: 108/61 128/85 112/74 101/62  Pulse: 84 76 71 80  Resp: 17 (!) 21 13 15   Temp: 99.3 F (37.4 C) 98.6 F (37 C) 98.5 F (36.9 C) 97.7 F (36.5 C)  TempSrc: Oral Oral Oral Oral  SpO2: 98% 98% 97% 98%  Weight:   73.4 kg (161 lb 13.1 oz)   Height:       General: Alert, oriented x3, no distress Head: no evidence of trauma, PERRL, EOMI, no exophtalmos or lid lag, no myxedema, no xanthelasma; normal ears, nose and oropharynx Neck: normal jugular venous pulsations and no hepatojugular reflux; brisk carotid pulses without delay and no carotid bruits Chest: clear to auscultation, no signs of consolidation by percussion or palpation, normal fremitus, symmetrical and full respiratory excursions Cardiovascular: normal position and quality of the apical impulse, regular rhythm, normal first and  second heart sounds, no rubs or gallops, no murmur Abdomen: no tenderness or distention, no masses by palpation, no abnormal pulsatility or arterial bruits, normal bowel sounds, no hepatosplenomegaly Extremities: no clubbing, cyanosis or edema; 2+ radial, ulnar and brachial pulses bilaterally; 2+ right femoral, posterior tibial and dorsalis pedis pulses; 2+ left femoral, posterior tibial and dorsalis pedis pulses; no subclavian or femoral bruits Neurological: grossly nonfocal  LABS  CBC  Recent Labs  12/25/15 0239 12/25/15 1513  WBC 8.8 6.5  HGB 14.0 13.8  HCT 41.9 41.1  MCV 90.1 89.5  PLT 311 [id number]   Basic Metabolic Panel  Recent Labs  12/23/15 1100 12/23/15 1200 12/25/15 1513  NA 136 138  --   K 4.0 3.9  --   CL 101 103  --   CO2 26 25  --   GLUCOSE 309* 290*  --   BUN 13 13  --   CREATININE 0.74 0.64 0.61  CALCIUM 9.3 8.8*  --   MG  --  1.8  --    Liver Function Tests  Recent Labs  12/23/15 1200  AST 48*  ALT 40  ALKPHOS 74  BILITOT 0.7  PROT 7.2  ALBUMIN 3.9   No results for input(s): LIPASE, AMYLASE in the last  72 hours. Cardiac Enzymes  Recent Labs  12/23/15 1200 12/24/15 0156  TROPONINI 5.93* 6.97*   BNP Invalid input(s): POCBNP D-Dimer No results for input(s): DDIMER in the last 72 hours. Hemoglobin A1C  Recent Labs  12/24/15 0447  HGBA1C 11.7*   Fasting Lipid Panel  Recent Labs  12/24/15 1256  CHOL 171  HDL 20*  LDLCALC 99  TRIG 261*  CHOLHDL 8.6   Thyroid Function Tests  Recent Labs  12/23/15 1200  TSH 1.550    Radiology Studies Imaging results have been reviewed and No results found.  TELE NSR  CATH Left Heart   Left Ventricle The left ventricular ejection fraction is 45-50% by visual estimate.    Coronary Diagrams   Diagnostic Diagram        ASSESSMENT AND PLAN   1. NSTEMI: no further angina. 2. CAD, multivessel: appreciate Dr. Tamala Julian and Dr. Leonarda Salon thoughtful recommendations. Decision  regarding medical therapy versus CABG rests heavily on the estimated severity of LAD disease, which is borderline by visual estimation (60-65%). Plan evaluation by pressure wire analysis today. IF FFR is not clearly depressed, plan medical therapy. If FFR is low, plan CABG.  Percutaneous revascularization is not a good option (CTO of LCX, multiple sequential RCA lesions, diffuse LAD disease). 3. Ischemic cardiomyopathy: mildly depressed EF without overt CHF and with minimally increased LVEDP at cath (18 mm Hg). Add low dose ACE inhibitor to the beta blocker. 4. DM: newly diagnosed, but likely long lasting. He describes polyuria/nocturia and polydipsia for years, which he attributed to working in a hot environment. Appreciate hospitalist assistance to initiate medical therapy and a follow up plan. Note that he may not be able to afford expensive meds such as Lantus. Diabetes educator saw him today. 5. HLP:LDL around 100. Plan high dose atorvastatin for a year and reevaluate long term lipid lowering therapy based on follow up results. Target LDL<100. Diet and exercise discussed. 6. Social issues: He reports being uninsured at this time after losing his job. He has been married to his American citizen wife for 11 years, but his own immigration status is not settled. Will need assistance from the case manager/billing office.   Sanda Klein, MD, [facility] [phone] office [phone] pager 12/26/2015 10:35 AM

## 2015-12-26 NOTE — Progress Notes (Signed)
TR BAND REMOVAL  LOCATION:    right radial  DEFLATED PER PROTOCOL:    Yes.    TIME BAND OFF / DRESSING APPLIED:    21:15   SITE UPON ARRIVAL:    Level 0  SITE AFTER BAND REMOVAL:    Level 0  CIRCULATION SENSATION AND MOVEMENT:    Within Normal Limits   Yes.    COMMENTS:   Post TR band instructions given. No hematoma; No bleeding noted. Pt tolerated well.

## 2015-12-26 NOTE — H&P (View-Only) (Signed)
Patient Name: Kurt Mathis Date of Encounter: 12/26/2015  Principal Problem:   NSTEMI (non-ST elevated myocardial infarction) [facility]) Active Problems:   Diabetes mellitus type 2, uncontrolled, with complications ([place])   Essential hypertension   Length of Stay: 3  SUBJECTIVE  No angina, no dyspnea. Glycemic control improving. BP OK.  CURRENT MEDS . aspirin EC  81 mg Oral Daily  . atorvastatin  80 mg Oral q1800  . enoxaparin (LOVENOX) injection  40 mg Subcutaneous Q24H  . insulin aspart  0-15 Units Subcutaneous Q4H  . insulin glargine  10 Units Subcutaneous QHS  . metoprolol tartrate  25 mg Oral BID  . sodium chloride flush  3 mL Intravenous Q12H    OBJECTIVE   Intake/Output Summary (Last 24 hours) at 12/26/15 1035 Last data filed at 12/26/15 0942  Gross per 24 hour  Intake          1305.08 ml  Output              251 ml  Net          1054.08 ml   Filed Weights   12/24/15 0338 12/25/15 0424 12/26/15 0500  Weight: 70.6 kg (155 lb 10.3 oz) 73.1 kg (161 lb 2.5 oz) 73.4 kg (161 lb 13.1 oz)    PHYSICAL EXAM Vitals:   12/25/15 2100 12/26/15 0009 12/26/15 0500 12/26/15 0700  BP: 108/61 128/85 112/74 101/62  Pulse: 84 76 71 80  Resp: 17 (!) 21 13 15   Temp: 99.3 F (37.4 C) 98.6 F (37 C) 98.5 F (36.9 C) 97.7 F (36.5 C)  TempSrc: Oral Oral Oral Oral  SpO2: 98% 98% 97% 98%  Weight:   73.4 kg (161 lb 13.1 oz)   Height:       General: Alert, oriented x3, no distress Head: no evidence of trauma, PERRL, EOMI, no exophtalmos or lid lag, no myxedema, no xanthelasma; normal ears, nose and oropharynx Neck: normal jugular venous pulsations and no hepatojugular reflux; brisk carotid pulses without delay and no carotid bruits Chest: clear to auscultation, no signs of consolidation by percussion or palpation, normal fremitus, symmetrical and full respiratory excursions Cardiovascular: normal position and quality of the apical impulse, regular rhythm, normal first and  second heart sounds, no rubs or gallops, no murmur Abdomen: no tenderness or distention, no masses by palpation, no abnormal pulsatility or arterial bruits, normal bowel sounds, no hepatosplenomegaly Extremities: no clubbing, cyanosis or edema; 2+ radial, ulnar and brachial pulses bilaterally; 2+ right femoral, posterior tibial and dorsalis pedis pulses; 2+ left femoral, posterior tibial and dorsalis pedis pulses; no subclavian or femoral bruits Neurological: grossly nonfocal  LABS  CBC  Recent Labs  12/25/15 0239 12/25/15 1513  WBC 8.8 6.5  HGB 14.0 13.8  HCT 41.9 41.1  MCV 90.1 89.5  PLT 311 [id number]   Basic Metabolic Panel  Recent Labs  12/23/15 1100 12/23/15 1200 12/25/15 1513  NA 136 138  --   K 4.0 3.9  --   CL 101 103  --   CO2 26 25  --   GLUCOSE 309* 290*  --   BUN 13 13  --   CREATININE 0.74 0.64 0.61  CALCIUM 9.3 8.8*  --   MG  --  1.8  --    Liver Function Tests  Recent Labs  12/23/15 1200  AST 48*  ALT 40  ALKPHOS 74  BILITOT 0.7  PROT 7.2  ALBUMIN 3.9   No results for input(s): LIPASE, AMYLASE in the last  72 hours. Cardiac Enzymes  Recent Labs  12/23/15 1200 12/24/15 0156  TROPONINI 5.93* 6.97*   BNP Invalid input(s): POCBNP D-Dimer No results for input(s): DDIMER in the last 72 hours. Hemoglobin A1C  Recent Labs  12/24/15 0447  HGBA1C 11.7*   Fasting Lipid Panel  Recent Labs  12/24/15 1256  CHOL 171  HDL 20*  LDLCALC 99  TRIG 261*  CHOLHDL 8.6   Thyroid Function Tests  Recent Labs  12/23/15 1200  TSH 1.550    Radiology Studies Imaging results have been reviewed and No results found.  TELE NSR  CATH Left Heart   Left Ventricle The left ventricular ejection fraction is 45-50% by visual estimate.    Coronary Diagrams   Diagnostic Diagram        ASSESSMENT AND PLAN   1. NSTEMI: no further angina. 2. CAD, multivessel: appreciate Dr. Tamala Julian and Dr. Leonarda Salon thoughtful recommendations. Decision  regarding medical therapy versus CABG rests heavily on the estimated severity of LAD disease, which is borderline by visual estimation (60-65%). Plan evaluation by pressure wire analysis today. IF FFR is not clearly depressed, plan medical therapy. If FFR is low, plan CABG.  Percutaneous revascularization is not a good option (CTO of LCX, multiple sequential RCA lesions, diffuse LAD disease). 3. Ischemic cardiomyopathy: mildly depressed EF without overt CHF and with minimally increased LVEDP at cath (18 mm Hg). Add low dose ACE inhibitor to the beta blocker. 4. DM: newly diagnosed, but likely long lasting. He describes polyuria/nocturia and polydipsia for years, which he attributed to working in a hot environment. Appreciate hospitalist assistance to initiate medical therapy and a follow up plan. Note that he may not be able to afford expensive meds such as Lantus. Diabetes educator saw him today. 5. HLP:LDL around 100. Plan high dose atorvastatin for a year and reevaluate long term lipid lowering therapy based on follow up results. Target LDL<100. Diet and exercise discussed. 6. Social issues: He reports being uninsured at this time after losing his job. He has been married to his American citizen wife for 11 years, but his own immigration status is not settled. Will need assistance from the case manager/billing office.   Sanda Klein, MD, [facility] [phone] office [phone] pager 12/26/2015 10:35 AM

## 2015-12-26 NOTE — Progress Notes (Signed)
ANTICOAGULATION CONSULT NOTE - Follow Up Consult  Pharmacy Consult for Heparin Indication: ACS/STEMI  No Known Allergies  Patient Measurements: Height: 5\' 10"  (177.8 cm) Weight: 161 lb 13.1 oz (73.4 kg) IBW/kg (Calculated) : 73   Vital Signs: Temp: 97.3 F (36.3 C) (09/19 1151) Temp Source: Oral (09/19 1151) BP: 125/80 (09/19 1702) Pulse Rate: 79 (09/19 1702)  Labs:  Recent Labs  12/24/15 0156  12/24/15 1256 12/24/15 1847 12/25/15 0239 12/25/15 1513 12/26/15 1635  HGB  --   < >  --   --  14.0 13.8 15.1  HCT  --   < >  --   --  41.9 41.1 44.3  PLT  --   < >  --   --  311 314 357  HEPARINUNFRC  --   < > 0.33 0.34 0.29*  --   --   CREATININE  --   --   --   --   --  0.61 0.74  TROPONINI 6.97*  --   --   --   --   --   --   < > = values in this interval not displayed.  Estimated Creatinine Clearance: 107.7 mL/min (by C-G formula based on SCr of 0.74 mg/dL).   Medications:  Scheduled:  . aspirin EC  81 mg Oral Daily  . atorvastatin  80 mg Oral q1800  . insulin aspart  0-15 Units Subcutaneous Q4H  . insulin glargine  10 Units Subcutaneous QHS  . lisinopril  2.5 mg Oral Daily  . metoprolol tartrate  25 mg Oral BID  . sodium chloride flush  3 mL Intravenous Q12H    Assessment: 55yo male s/p intravascular pressure wire/FFR study & found to have severe LAD, diagonal, RCA stenosis- to start heparin at 2AM (8hr after sheath pulled).  Pt was previously on Heparin 1300 units/hr and just therapeutic, so will bump rate up a bit.    Goal of Therapy:  Heparin level 0.3-0.7 units/ml Monitor platelets by anticoagulation protocol: Yes   Plan:  Heparin 1350 units/hr Check heparin level and CBC 8hr, then daily Watch for s/s of bleeding  Gracy Bruins, [facility]

## 2015-12-26 NOTE — Progress Notes (Signed)
Pt with orders to transfer to 2W01. Report given to [facility]. V/S stable. No c/o any chest discomfort or pain. Transferred.

## 2015-12-26 NOTE — Progress Notes (Signed)
Inpatient Diabetes Program Recommendations  AACE/ADA: New Consensus Statement on Inpatient Glycemic Control (2015)  Target Ranges:  Prepandial:   less than 140 mg/dL      Peak postprandial:   less than 180 mg/dL (1-2 hours)      Critically ill patients:  140 - 180 mg/dL   Lab Results  Component Value Date   GLUCAP 109 (H) 12/26/2015   HGBA1C 11.7 (H) 12/24/2015    Inpatient Diabetes Program Recommendations:   Spoke with pt and wife about new diagnosis. Discussed A1C results with them and explained what an A1C is, basic pathophysiology of DM Type 2, basic home care, basic diabetes diet nutrition principles, importance of checking CBGs and maintaining good CBG control to prevent long-term and short-term complications. Reviewed signs and symptoms of hyperglycemia and hypoglycemia and how to treat hypoglycemia at home. Also reviewed blood sugar goals at home.  RNs to provide ongoing basic DM education at bedside with this patient. Have ordered educational booklet, insulin starter kit, and DM videos. Have also placed RD consult for DM diet education for this patient.   Patient and wife returned demonstration with insulin pen. Very receptive to education regarding nutrition and insulin administration.   Thank you, Nani Gasser. [place] Antolin, RN, MSN, CDE Inpatient Glycemic Control Team Team Pager [phone] (8am-5pm) 12/26/2015 9:08 AM

## 2015-12-26 NOTE — Interval H&P Note (Signed)
History and Physical Interval Note:  12/26/2015 4:28 PM  Kurt Mathis  has presented today for cardiac cath with the diagnosis of CAD/NSTEMI. Plan for FFR of LAD.   The various methods of treatment have been discussed with the patient and family. After consideration of risks, benefits and other options for treatment, the patient has consented to  Procedure(s): Intravascular Pressure Wire/FFR Study (N/A) as a surgical intervention .  The patient's history has been reviewed, patient examined, no change in status, stable for surgery.  I have reviewed the patient's chart and labs.  Questions were answered to the patient's satisfaction.    No PCI is planned.    Lauree Chandler

## 2015-12-26 NOTE — Progress Notes (Signed)
[facility] They have given pt appt on 01-03-16 at 1:15pm.

## 2015-12-26 NOTE — Progress Notes (Signed)
PROGRESS NOTE    Kurt Mathis  [id number] DOB: 21-Sep-1960 DOA: 12/23/2015 PCP: Sanda Klein, MD    Brief Narrative:  55 y/o presengint with Non Stemi and newly diagnosed DM.    Assessment & Plan:   Active Problems:   NSTEMI (non-ST elevated myocardial infarction) [facility]) - Cardiology on board and recommending catheterization/coronary angiographya nd resvascluarization as indicated. - Cardiology to decide when patient to transition to home.    Diabetes mellitus type 2, uncontrolled, with complications ([place]) - Once ready for discharge may discharge on Lantus 10 units SQ daily. Please provide script for lancets and strips as well as glucometer. - Pt to check his blood sugars at least 2 times per day once fasting and the other post prandial. - Recommend he f/u with his primary care physician within the next 1 week after hospital discharge.     Essential hypertension - currently normotensive on metoprolol   DVT prophylaxis: On heparin Code Status: Full Family Communication: None at bedside Disposition Plan: Pending improvement in condition   Consultants:   Cardiology   Procedures: None   Antimicrobials: None   Subjective: Answered questions to patient's satisfaction.   Objective: Vitals:   12/25/15 2100 12/26/15 0009 12/26/15 0500 12/26/15 0700  BP: 108/61 128/85 112/74 101/62  Pulse: 84 76 71 80  Resp: 17 (!) 21 13 15   Temp: 99.3 F (37.4 C) 98.6 F (37 C) 98.5 F (36.9 C) 97.7 F (36.5 C)  TempSrc: Oral Oral Oral Oral  SpO2: 98% 98% 97% 98%  Weight:   73.4 kg (161 lb 13.1 oz)   Height:        Intake/Output Summary (Last 24 hours) at 12/26/15 0947 Last data filed at 12/26/15 0942  Gross per 24 hour  Intake          1388.68 ml  Output              251 ml  Net          1137.68 ml   Filed Weights   12/24/15 0338 12/25/15 0424 12/26/15 0500  Weight: 70.6 kg (155 lb 10.3 oz) 73.1 kg (161 lb 2.5 oz) 73.4 kg (161 lb 13.1 oz)     Examination:  General exam: Appears calm and comfortable, in nad. Respiratory system: Clear to auscultation. Respiratory effort normal. Cardiovascular system: S1 & S2 heard, RRR. No rubs, no murmurs Gastrointestinal system: Abdomen is nondistended, soft and nontender. No organomegaly or masses felt. Normal bowel sounds heard. Central nervous system: Alert and oriented. No focal neurological deficits. Extremities: Symmetric 5 x 5 power. Skin: No rashes, lesions or ulcers Psychiatry: Judgement and insight appear normal. Mood & affect appropriate.   Data Reviewed: I have personally reviewed following labs and imaging studies  CBC:  Recent Labs Lab 12/23/15 1100 12/24/15 0312 12/24/15 0447 12/25/15 0239 12/25/15 1513  WBC 7.8 7.9 8.7 8.8 6.5  HGB 15.4 8.5* 14.2 14.0 13.8  HCT 44.5 29.2* 42.2 41.9 41.1  MCV 88.5 101.0* 90.2 90.1 89.5  PLT 288 326 289 311 [id number]   Basic Metabolic Panel:  Recent Labs Lab 12/23/15 1100 12/23/15 1200 12/25/15 1513  NA 136 138  --   K 4.0 3.9  --   CL 101 103  --   CO2 26 25  --   GLUCOSE 309* 290*  --   BUN 13 13  --   CREATININE 0.74 0.64 0.61  CALCIUM 9.3 8.8*  --   MG  --  1.8  --  GFR: Estimated Creatinine Clearance: 107.7 mL/min (by C-G formula based on SCr of 0.61 mg/dL). Liver Function Tests:  Recent Labs Lab 12/23/15 1200  AST 48*  ALT 40  ALKPHOS 74  BILITOT 0.7  PROT 7.2  ALBUMIN 3.9   No results for input(s): LIPASE, AMYLASE in the last 168 hours. No results for input(s): AMMONIA in the last 168 hours. Coagulation Profile:  Recent Labs Lab 12/23/15 1200  INR 1.07   Cardiac Enzymes:  Recent Labs Lab 12/23/15 1200 12/24/15 0156  TROPONINI 5.93* 6.97*   BNP (last 3 results) No results for input(s): PROBNP in the last 8760 hours. HbA1C:  Recent Labs  12/23/15 1058 12/24/15 0447  HGBA1C 12.0* 11.7*   CBG:  Recent Labs Lab 12/25/15 1615 12/25/15 2112 12/26/15 0015 12/26/15 0520  12/26/15 0759  GLUCAP 130* 146* 148* 122* 109*   Lipid Profile:  Recent Labs  12/24/15 0312 12/24/15 1256  CHOL 173 171  HDL 34* 20*  LDLCALC 84 99  TRIG 275* 261*  CHOLHDL 5.1 8.6   Thyroid Function Tests:  Recent Labs  12/23/15 1200  TSH 1.550   Anemia Panel: No results for input(s): VITAMINB12, FOLATE, FERRITIN, TIBC, IRON, RETICCTPCT in the last 72 hours. Sepsis Labs: No results for input(s): PROCALCITON, LATICACIDVEN in the last 168 hours.  Recent Results (from the past 240 hour(s))  MRSA PCR Screening     Status: None   Collection Time: 12/23/15  3:30 PM  Result Value Ref Range Status   MRSA by PCR NEGATIVE NEGATIVE Final    Comment:        The GeneXpert MRSA Assay (FDA approved for NASAL specimens only), is one component of a comprehensive MRSA colonization surveillance program. It is not intended to diagnose MRSA infection nor to guide or monitor treatment for MRSA infections.       Radiology Studies: No results found.  Scheduled Meds: . aspirin EC  81 mg Oral Daily  . atorvastatin  80 mg Oral q1800  . enoxaparin (LOVENOX) injection  40 mg Subcutaneous Q24H  . insulin aspart  0-15 Units Subcutaneous Q4H  . insulin glargine  10 Units Subcutaneous QHS  . living well with diabetes book   Does not apply Once  . metoprolol tartrate  25 mg Oral BID  . sodium chloride flush  3 mL Intravenous Q12H   Continuous Infusions:     LOS: 3 days    Time spent: > 35 minutes    Velvet Bathe, MD [facility] Pager [phone]  If 7PM-7AM, please contact night-coverage [email or web address] Password [facility] 12/26/2015, 9:47 AM

## 2015-12-26 NOTE — Plan of Care (Signed)
Problem: Food- and Nutrition-Related Knowledge Deficit (NB-1.1) Goal: Nutrition education Formal process to instruct or train a patient/client in a skill or to impart knowledge to help patients/clients voluntarily manage or modify food choices and eating behavior to maintain or improve health. Outcome: Adequate for Discharge  RD consulted for nutrition education regarding diabetes.   Lab Results  Component Value Date   HGBA1C 11.7 (H) 12/24/2015   Spoke with pt wife at bedside. She acknowledges that she does all the food preparation for the household. She met with DM coordinator previously this AM and has specific questions about diet. Pt reveals that she feels overwhelmed by the information present, however, is motivated to make lifestyle changes to benefit the entire family. Focus on education was basic DM diet principles (using plate method), healthier food preparation methods, and portion control.   RD provided "Carbohydrate Counting for People with Diabetes" (both in [place] and [place]) handout from the Academy of Nutrition and Dietetics. Discussed different food groups and their effects on blood sugar, emphasizing carbohydrate-containing foods. Provided list of carbohydrates and recommended serving sizes of common foods.  Discussed importance of controlled and consistent carbohydrate intake throughout the day. Provided examples of ways to balance meals/snacks and encouraged intake of high-fiber, whole grain complex carbohydrates. Teach back method used.  Expect fair to good compliance.  Body mass index is 23.22 kg/m. Pt meets criteria for normal weight range based on current BMI.  Current diet order is Heart Healthy/ Carb Modified, patient is consuming approximately 90-100% of meals at this time. Labs and medications reviewed. No further nutrition interventions warranted at this time. RD contact information provided. If additional nutrition issues arise, please re-consult RD.  Kurt Mathis  A. Jimmye Norman, RD, LDN, CDE Pager: [phone] After hours Pager: [phone]

## 2015-12-27 ENCOUNTER — Encounter (HOSPITAL_COMMUNITY): Payer: Self-pay | Admitting: Cardiovascular Disease

## 2015-12-27 DIAGNOSIS — E785 Hyperlipidemia, unspecified: Secondary | ICD-10-CM

## 2015-12-27 LAB — HEPARIN LEVEL (UNFRACTIONATED)
HEPARIN UNFRACTIONATED: 0.35 [IU]/mL (ref 0.30–0.70)
HEPARIN UNFRACTIONATED: 0.4 [IU]/mL (ref 0.30–0.70)

## 2015-12-27 LAB — GLUCOSE, CAPILLARY
GLUCOSE-CAPILLARY: 129 mg/dL — AB (ref 65–99)
GLUCOSE-CAPILLARY: 136 mg/dL — AB (ref 65–99)
Glucose-Capillary: 114 mg/dL — ABNORMAL HIGH (ref 65–99)
Glucose-Capillary: 164 mg/dL — ABNORMAL HIGH (ref 65–99)
Glucose-Capillary: 179 mg/dL — ABNORMAL HIGH (ref 65–99)
Glucose-Capillary: 200 mg/dL — ABNORMAL HIGH (ref 65–99)

## 2015-12-27 MED ORDER — INSULIN GLARGINE 100 UNIT/ML ~~LOC~~ SOLN
12.0000 [IU] | Freq: Every day | SUBCUTANEOUS | Status: DC
  Administered 2015-12-27: 12 [IU] via SUBCUTANEOUS
  Filled 2015-12-27 (×2): qty 0.12

## 2015-12-27 MED ORDER — INSULIN ASPART 100 UNIT/ML ~~LOC~~ SOLN
0.0000 [IU] | Freq: Three times a day (TID) | SUBCUTANEOUS | Status: DC
  Administered 2015-12-27: 2 [IU] via SUBCUTANEOUS
  Administered 2015-12-28: 5 [IU] via SUBCUTANEOUS
  Administered 2015-12-28 – 2015-12-29 (×4): 3 [IU] via SUBCUTANEOUS
  Administered 2015-12-29: 2 [IU] via SUBCUTANEOUS
  Administered 2015-12-30: 3 [IU] via SUBCUTANEOUS
  Administered 2015-12-30 – 2015-12-31 (×2): 5 [IU] via SUBCUTANEOUS
  Administered 2015-12-31: 3 [IU] via SUBCUTANEOUS
  Administered 2016-01-01: 2 [IU] via SUBCUTANEOUS

## 2015-12-27 NOTE — Progress Notes (Signed)
1 Day Post-Op Procedure(s) (LRB): Intravascular Pressure Wire/FFR Study (N/A) Coronary Angiogram Subjective: No complaints  Objective: Vital signs in last 24 hours: Temp:  [97.3 F (36.3 C)-98.2 F (36.8 C)] 98.2 F (36.8 C) (09/20 0346) Pulse Rate:  [70-108] 70 (09/20 0346) Cardiac Rhythm: Normal sinus rhythm (09/19 2326) Resp:  [10-20] 20 (09/20 0346) BP: (109-139)/(64-90) 113/64 (09/20 0346) SpO2:  [97 %-100 %] 98 % (09/20 0346) Weight:  [160 lb 11.2 oz (72.9 kg)] 160 lb 11.2 oz (72.9 kg) (09/20 0346)  Hemodynamic parameters for last 24 hours:    Intake/Output from previous day: 09/19 0701 - 09/20 0700 In: 838.7 [P.O.:600; I.V.:238.7] Out: 401 [Urine:400; Stool:1] Intake/Output this shift: No intake/output data recorded.  General appearance: alert, cooperative and no distress Neurologic: intact Heart: regular rate and rhythm Lungs: diminished breath sounds bibasilar  Lab Results:  Recent Labs  12/25/15 1513 12/26/15 1635  WBC 6.5 7.5  HGB 13.8 15.1  HCT 41.1 44.3  PLT 314 357   BMET:  Recent Labs  12/25/15 1513 12/26/15 1635  NA  --  138  K  --  4.1  CL  --  103  CO2  --  28  GLUCOSE  --  148*  BUN  --  9  CREATININE 0.61 0.74  CALCIUM  --  9.5    PT/INR: No results for input(s): LABPROT, INR in the last 72 hours. ABG No results found for: PHART, HCO3, TCO2, ACIDBASEDEF, O2SAT CBG (last 3)   Recent Labs  12/27/15 0012 12/27/15 0350 12/27/15 0759  GLUCAP 164* 114* 129*    Assessment/Plan: S/P Procedure(s) (LRB): Intravascular Pressure Wire/FFR Study (N/A) Coronary Angiogram -  Cath with FFR yesterday showed an FFR of 0.79 in LAD c/w significant stenosis. Based on that has 3 vessel CAD with unstable symptoms. CABG indicated for survival benefit and relief of symptoms.  I discussed the general nature of the procedure, the need for general anesthesia, the use of cardiopulmonary bypass, and the incisions to be used with Kurt Mathis.  We discussed the expected hospital stay, overall recovery and short and long term outcomes. We discussed use of the left radial artery. I reviewed the indications, risks, benefits and alternatives. He understands the risks include, but are not limited to death, stroke, MI, DVT/PE, bleeding, possible need for transfusion, infections, cardiac arrhythmias, and other organ system dysfunction including respiratory, renal, or GI complications. He accepts the risks and agrees to proceed.  For CABG with LIMA, left radial artery, and saphenous vein on Monday 9/25 (1st available opening)  LOS: 4 days    Kurt Mathis 12/27/2015

## 2015-12-27 NOTE — Progress Notes (Signed)
ANTICOAGULATION CONSULT NOTE - Follow Up Consult  Pharmacy Consult for Heparin Indication: ACS/STEMI  No Known Allergies  Patient Measurements: Height: 5\' 10"  (177.8 cm) Weight: 160 lb 11.2 oz (72.9 kg) IBW/kg (Calculated) : 73   Vital Signs: Temp: 98.2 F (36.8 C) (09/20 0346) Temp Source: Oral (09/20 0346) BP: 113/64 (09/20 0346) Pulse Rate: 70 (09/20 0346)  Labs:  Recent Labs  12/24/15 1256 12/24/15 1847  12/25/15 0239 12/25/15 1513 12/26/15 1635  HGB  --   --   < > 14.0 13.8 15.1  HCT  --   --   --  41.9 41.1 44.3  PLT  --   --   --  311 314 357  HEPARINUNFRC 0.33 0.34  --  0.29*  --   --   CREATININE  --   --   --   --  0.61 0.74  < > = values in this interval not displayed.  Estimated Creatinine Clearance: 107.6 mL/min (by C-G formula based on SCr of 0.74 mg/dL).   Medications:  Scheduled:  . aspirin EC  81 mg Oral Daily  . atorvastatin  80 mg Oral q1800  . insulin aspart  0-15 Units Subcutaneous Q4H  . insulin glargine  12 Units Subcutaneous QHS  . lisinopril  2.5 mg Oral Daily  . metoprolol tartrate  25 mg Oral BID  . sodium chloride flush  3 mL Intravenous Q12H    Assessment: 55yo male s/p intravascular pressure wire/FFR study & found to have severe LAD, diagonal, RCA stenosis- heparin per Pharmacy resumed 8h post-sheath removal on 9/19.  Heparin level therapeutic (0.35) on 1350 units/h. CBC wnl, no bleed documented. Awaiting CABG Monday.  Goal of Therapy:  Heparin level 0.3-0.7 units/ml Monitor platelets by anticoagulation protocol: Yes   Plan:  Heparin at 1350 units/h 6h heparin level to confirm, daily heparin level/CBC Monitor for s/sx bleeding CABG - scheduled for 9/25  Elicia Lamp, PharmD, BCPS Clinical Pharmacist Pager [phone] 12/27/2015 10:05 AM

## 2015-12-27 NOTE — Care Management Note (Signed)
Case Management Note Previous CM note initiated by Lacretia Leigh, RN 12/25/2015, 10:40 AM   Patient Details  Name: Rasha Olivio MRN: [id number] Date of Birth: 1960/10/25  Subjective/Objective:  Adm w new dm, nstemi                  Action/Plan:lives w wife and son  Expected Discharge Date:                  Expected Discharge Plan:  Home/Self Care  In-House Referral:  Financial Counselor  Discharge [facility], CM Consult, Follow-up appt scheduled  Post Acute Care Choice:    Choice offered to:     DME Arranged:    DME Agency:     HH Arranged:    HH Agency:     Status of Service:     If discussed at [employer], dates discussed:    Additional Comments:   12/27/15- Marvetta Gibbons RN, CM- Pt will need CABG- plan for OR on Monday- Sept. 25- call made to the IM clinic on the ground floor to re-schedule the appointment given for next week- spoke with Tamela Oddi Solomon-[phone])- will need to call her or send a msg to her when pt ready for discharge post op so that she can re-schedule pt's appointment with clinic. CM to continue to follow   12/26/15- Per Donne Anon- [facility]. They have given pt appt on 01-03-16 at 1:15pm   12/25/15- Per Donne Anon RN, CM-  gave pt inform on [place] [facility].  Have contacted [facility], [facility] med[facility]sickle cell clinic, [facility] and no one taking referrals. [facility] but may be end of October. Have left message w [facility] and pediatric clinic to see if they are taking any new pt's. Have not been able to get pt w a pcp clinic .  Dahlia Client [place], RN 12/27/2015, 10:10 AM [phone]

## 2015-12-27 NOTE — Progress Notes (Signed)
Patient Name: Kurt Mathis Date of Encounter: 12/27/2015  Principal Problem:   NSTEMI (non-ST elevated myocardial infarction) [facility]) Active Problems:   Diabetes mellitus type 2, uncontrolled, with complications ([place])   Essential hypertension   Length of Stay: 4  SUBJECTIVE  No angina at rest. Worried, but appears resigned to need for surgery. Wife not here this AM.  CURRENT MEDS . aspirin EC  81 mg Oral Daily  . atorvastatin  80 mg Oral q1800  . insulin aspart  0-15 Units Subcutaneous Q4H  . insulin glargine  12 Units Subcutaneous QHS  . lisinopril  2.5 mg Oral Daily  . metoprolol tartrate  25 mg Oral BID  . sodium chloride flush  3 mL Intravenous Q12H    OBJECTIVE   Intake/Output Summary (Last 24 hours) at 12/27/15 0937 Last data filed at 12/27/15 0651  Gross per 24 hour  Intake            478.7 ml  Output              401 ml  Net             77.7 ml   Filed Weights   12/25/15 0424 12/26/15 0500 12/27/15 0346  Weight: 73.1 kg (161 lb 2.5 oz) 73.4 kg (161 lb 13.1 oz) 72.9 kg (160 lb 11.2 oz)    PHYSICAL EXAM Vitals:   12/26/15 2011 12/26/15 2100 12/26/15 2307 12/27/15 0346  BP:  118/74 113/65 113/64  Pulse: (!) 108 85 81 70  Resp: 20 20 18 20   Temp:   98.1 F (36.7 C) 98.2 F (36.8 C)  TempSrc: Oral  Oral Oral  SpO2: 99% 97% 98% 98%  Weight:    72.9 kg (160 lb 11.2 oz)  Height:       General: Alert, oriented x3, no distress Head: no evidence of trauma, PERRL, EOMI, no exophtalmos or lid lag, no myxedema, no xanthelasma; normal ears, nose and oropharynx Neck: normal jugular venous pulsations and no hepatojugular reflux; brisk carotid pulses without delay and no carotid bruits Chest: clear to auscultation, no signs of consolidation by percussion or palpation, normal fremitus, symmetrical and full respiratory excursions Cardiovascular: normal position and quality of the apical impulse, regular rhythm, normal first and second heart sounds, no rubs or  gallops, no murmur Abdomen: no tenderness or distention, no masses by palpation, no abnormal pulsatility or arterial bruits, normal bowel sounds, no hepatosplenomegaly Extremities: no clubbing, cyanosis or edema; 2+ radial, ulnar and brachial pulses bilaterally; 2+ right femoral, posterior tibial and dorsalis pedis pulses; 2+ left femoral, posterior tibial and dorsalis pedis pulses; no subclavian or femoral bruits Neurological: grossly nonfocal  LABS  CBC  Recent Labs  12/25/15 1513 12/26/15 1635  WBC 6.5 7.5  HGB 13.8 15.1  HCT 41.1 44.3  MCV 89.5 89.7  PLT 314 [id number]   Basic Metabolic Panel  Recent Labs  12/25/15 1513 12/26/15 1635  NA  --  138  K  --  4.1  CL  --  103  CO2  --  28  GLUCOSE  --  148*  BUN  --  9  CREATININE 0.61 0.74  CALCIUM  --  9.5    Recent Labs  12/24/15 1256  CHOL 171  HDL 20*  LDLCALC 99  TRIG 261*  CHOLHDL 8.6    Radiology Studies Imaging results have been reviewed and No results found.  TELE NSR   ASSESSMENT AND PLAN   1. NSTEMI: no further angina. Clopidogrel after  CABG for 12 months. 2. 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 3. Ischemic cardiomyopathy: mildly depressed EF without overt CHF and with minimally increased LVEDP at cath (18 mm Hg). Added low dose ACE inhibitor to the beta blocker. Will increase to 5 mg daily tomorrow if BP holds. 4. DM: newly diagnosed, but likely long lasting. Appreciatehospitalist assistance to initiate medical therapy and a follow up plan. Note that he may not be able to afford expensive meds such as Lantus.Diabetes educator saw him yesterday. 5. HLP:LDL around 100. Plan high dose atorvastatin for a year and reevaluate long term lipid lowering therapy based on follow up results. Target LDL<70. Diet and exercise discussed. 6. Social issues: He reports being uninsured at this time after losing his job. He has been married to his American citizen wife for 11 years, but his own  immigration status is not settled. Will need assistance from the case manager/billing office.  Sanda Klein, MD, [facility] [phone] office [phone] pager 12/27/2015 9:37 AM

## 2015-12-27 NOTE — Progress Notes (Signed)
ANTICOAGULATION CONSULT NOTE - Follow Up Consult  Pharmacy Consult for Heparin Indication: ACS/STEMI  No Known Allergies  Patient Measurements: Height: 5\' 10"  (177.8 cm) Weight: 160 lb 11.2 oz (72.9 kg) IBW/kg (Calculated) : 73  Vital Signs:   Labs:  Recent Labs  12/25/15 0239 12/25/15 1513 12/26/15 1635 12/27/15 1047 12/27/15 1647  HGB 14.0 13.8 15.1  --   --   HCT 41.9 41.1 44.3  --   --   PLT 311 314 357  --   --   HEPARINUNFRC 0.29*  --   --  0.35 0.40  CREATININE  --  0.61 0.74  --   --     Estimated Creatinine Clearance: 107.6 mL/min (by C-G formula based on SCr of 0.74 mg/dL).   Medications:  Scheduled:  . aspirin EC  81 mg Oral Daily  . atorvastatin  80 mg Oral q1800  . [START ON 12/28/2015] insulin aspart  0-15 Units Subcutaneous TID WC  . insulin glargine  12 Units Subcutaneous QHS  . lisinopril  2.5 mg Oral Daily  . metoprolol tartrate  25 mg Oral BID  . sodium chloride flush  3 mL Intravenous Q12H    Assessment: 55yo male s/p intravascular pressure wire/FFR study & found to have severe LAD, diagonal, RCA stenosis- heparin per Pharmacy resumed 8h post-sheath removal on 9/19.  Heparin level therapeutic twice on 1350 units/hr. CBC wnl, no bleed documented. Awaiting CABG Monday.  Goal of Therapy:  Heparin level 0.3-0.7 units/ml Monitor platelets by anticoagulation protocol: Yes   Plan:  Heparin at 1350 units/h Daily heparin level/CBC Monitor for s/sx bleeding CABG - scheduled for 9/25  Georga Bora, PharmD Clinical Pharmacist Pager: [phone] 12/27/2015 6:20 PM

## 2015-12-27 NOTE — Progress Notes (Signed)
CARDIAC REHAB PHASE I   PRE:  Rate/Rhythm: 83 SR    BP: sitting 115/69    SaO2: 98 RA  MODE:  Ambulation: 400 ft   POST:  Rate/Rhythm: 87 SR    BP: sitting 122/67     SaO2: 100 RA  Pt sts he has throat pain when he is in the cold or drinks cold drinks (none while in hospital). Able to walk without throat pain however he did c/o dizziness after 200 ft. Return to room, resolved with rest. Pt has mostly been in bed for last few days. Discussed sternal precautions, IS, mobility and d/c planning. Wife sts she will be with him after d/c. Encouraged pt to walk. Gave him OHS booklet, guideline and videos to watch in [place] and [place]. Will bring OHS booklet in Spanish. [place], ACSM 12/27/2015 12:24 PM

## 2015-12-27 NOTE — Progress Notes (Signed)
PROGRESS NOTE    Kurt Mathis  [id number] DOB: 1960/09/15 DOA: 12/23/2015 PCP: Sanda Klein, MD    Brief Narrative:  55 y/o presengint with Non Stemi and newly diagnosed DM.    Assessment & Plan:   NSTEMI (non-ST elevated myocardial infarction) [facility]) - Cardiology on board and recommending catheterization/coronary angiography and resvascluarization as indicated. -plan is for CABG on 01/01/16 -continue heparin drip for now -continue B-blocker -no CP currently  Diabetes mellitus type 2, uncontrolled, with complications ([place]) - Once ready for discharge will use levemir (as patient w/o insurance) -might be able to use amaryl BID along with long acting insulin -discussed needs for modified carb diet and not skipping meals  Essential hypertension - currently normotensive on metoprolol -advise to follow low sodium diet   HLD -continue statins   DVT prophylaxis: On heparin Code Status: Full Family Communication: None at bedside Disposition Plan: Pending improvement in condition   Consultants:   Cardiology   Procedures:   CABG planned for 01/01/16   Antimicrobials: None   Subjective: Afebrile, in no distress. Patient is AAOX3. No nausea, no vomiting.  Objective: Vitals:   12/26/15 2011 12/26/15 2100 12/26/15 2307 12/27/15 0346  BP:  118/74 113/65 113/64  Pulse: (!) 108 85 81 70  Resp: 20 20 18 20   Temp:   98.1 F (36.7 C) 98.2 F (36.8 C)  TempSrc: Oral  Oral Oral  SpO2: 99% 97% 98% 98%  Weight:    72.9 kg (160 lb 11.2 oz)  Height:        Intake/Output Summary (Last 24 hours) at 12/27/15 1903 Last data filed at 12/27/15 1700  Gross per 24 hour  Intake           604.95 ml  Output              400 ml  Net           204.95 ml   Filed Weights   12/25/15 0424 12/26/15 0500 12/27/15 0346  Weight: 73.1 kg (161 lb 2.5 oz) 73.4 kg (161 lb 13.1 oz) 72.9 kg (160 lb 11.2 oz)    Examination:  General exam: Appears calm and comfortable, in nad.  Denies SOB/orthopnea Respiratory system: Clear to auscultation. Respiratory effort normal. Cardiovascular system: S1 & S2 heard, RRR. No rubs, no murmurs Gastrointestinal system: Abdomen is nondistended, soft and nontender. No organomegaly or masses felt. Normal bowel sounds heard. Central nervous system: Alert and oriented. No focal neurological deficits. Extremities: Symmetric 5 x 5 power. Skin: No rashes, lesions or ulcers Psychiatry: Judgement and insight appear normal. Mood & affect appropriate.   Data Reviewed: I have personally reviewed following labs and imaging studies  CBC:  Recent Labs Lab 12/24/15 0312 12/24/15 0447 12/25/15 0239 12/25/15 1513 12/26/15 1635  WBC 7.9 8.7 8.8 6.5 7.5  HGB 8.5* 14.2 14.0 13.8 15.1  HCT 29.2* 42.2 41.9 41.1 44.3  MCV 101.0* 90.2 90.1 89.5 89.7  PLT 326 289 311 314 [id number]   Basic Metabolic Panel:  Recent Labs Lab 12/23/15 1100 12/23/15 1200 12/25/15 1513 12/26/15 1635  NA 136 138  --  138  K 4.0 3.9  --  4.1  CL 101 103  --  103  CO2 26 25  --  28  GLUCOSE 309* 290*  --  148*  BUN 13 13  --  9  CREATININE 0.74 0.64 0.61 0.74  CALCIUM 9.3 8.8*  --  9.5  MG  --  1.8  --   --  GFR: Estimated Creatinine Clearance: 107.6 mL/min (by C-G formula based on SCr of 0.74 mg/dL).   Liver Function Tests:  Recent Labs Lab 12/23/15 1200  AST 48*  ALT 40  ALKPHOS 74  BILITOT 0.7  PROT 7.2  ALBUMIN 3.9   Coagulation Profile:  Recent Labs Lab 12/23/15 1200  INR 1.07   Cardiac Enzymes:  Recent Labs Lab 12/23/15 1200 12/24/15 0156  TROPONINI 5.93* 6.97*   CBG:  Recent Labs Lab 12/27/15 0012 12/27/15 0350 12/27/15 0759 12/27/15 1206 12/27/15 1619  GLUCAP 164* 114* 129* 179* 136*   Sepsis Labs: No results for input(s): PROCALCITON, LATICACIDVEN in the last 168 hours.  Recent Results (from the past 240 hour(s))  MRSA PCR Screening     Status: None   Collection Time: 12/23/15  3:30 PM  Result Value Ref Range  Status   MRSA by PCR NEGATIVE NEGATIVE Final    Comment:        The GeneXpert MRSA Assay (FDA approved for NASAL specimens only), is one component of a comprehensive MRSA colonization surveillance program. It is not intended to diagnose MRSA infection nor to guide or monitor treatment for MRSA infections.       Radiology Studies: No results found.  Scheduled Meds: . aspirin EC  81 mg Oral Daily  . atorvastatin  80 mg Oral q1800  . [START ON 12/28/2015] insulin aspart  0-15 Units Subcutaneous TID WC  . insulin glargine  12 Units Subcutaneous QHS  . lisinopril  2.5 mg Oral Daily  . metoprolol tartrate  25 mg Oral BID  . sodium chloride flush  3 mL Intravenous Q12H   Continuous Infusions: . heparin 1,350 Units/hr (12/27/15 1835)     LOS: 4 days    Time spent: > 20 minutes    Barton Dubois, MD [facility] Pager [phone]  If 7PM-7AM, please contact night-coverage [email or web address] Password [email or web address] 12/27/2015, 7:03 PM

## 2015-12-28 DIAGNOSIS — E785 Hyperlipidemia, unspecified: Secondary | ICD-10-CM

## 2015-12-28 LAB — GLUCOSE, CAPILLARY
GLUCOSE-CAPILLARY: 170 mg/dL — AB (ref 65–99)
GLUCOSE-CAPILLARY: 208 mg/dL — AB (ref 65–99)
GLUCOSE-CAPILLARY: 193 mg/dL — AB (ref 65–99)
GLUCOSE-CAPILLARY: 145 mg/dL — AB (ref 65–99)

## 2015-12-28 LAB — CBC
HEMATOCRIT: 41.2 % (ref 39.0–52.0)
HEMOGLOBIN: 13.6 g/dL (ref 13.0–17.0)
MCH: 30 pg (ref 26.0–34.0)
MCHC: 33 g/dL (ref 30.0–36.0)
MCV: 90.9 fL (ref 78.0–100.0)
Platelets: 351 10*3/uL (ref 150–400)
RBC: 4.53 MIL/uL (ref 4.22–5.81)
RDW: 13 % (ref 11.5–15.5)
WBC: 8 10*3/uL (ref 4.0–10.5)

## 2015-12-28 LAB — HEPARIN LEVEL (UNFRACTIONATED): HEPARIN UNFRACTIONATED: 0.38 [IU]/mL (ref 0.30–0.70)

## 2015-12-28 MED ORDER — INSULIN GLARGINE 100 UNIT/ML ~~LOC~~ SOLN
14.0000 [IU] | Freq: Every day | SUBCUTANEOUS | Status: DC
  Administered 2015-12-28 – 2015-12-30 (×3): 14 [IU] via SUBCUTANEOUS
  Filled 2015-12-28 (×4): qty 0.14

## 2015-12-28 NOTE — Progress Notes (Signed)
ANTICOAGULATION CONSULT NOTE - Follow Up Consult  Pharmacy Consult:  Heparin Indication: ACS, awaiting CABG  No Known Allergies  Patient Measurements: Height: 5\' 10"  (177.8 cm) Weight: 160 lb 0.6 oz (72.6 kg) IBW/kg (Calculated) : 73  Vital Signs: Temp: 98.5 F (36.9 C) (09/21 0349) Temp Source: Oral (09/21 0349) BP: 112/62 (09/21 0349) Pulse Rate: 69 (09/21 0349)  Labs:  Recent Labs  12/25/15 1513 12/26/15 1635 12/27/15 1047 12/27/15 1647 12/28/15 0216  HGB 13.8 15.1  --   --  13.6  HCT 41.1 44.3  --   --  41.2  PLT 314 357  --   --  351  HEPARINUNFRC  --   --  0.35 0.40 0.38  CREATININE 0.61 0.74  --   --   --     Estimated Creatinine Clearance: 107.1 mL/min (by C-G formula based on SCr of 0.74 mg/dL).     Assessment: 55yo male s/p intravascular pressure wire/FFR study & found to have severe LAD, diagonal, RCA stenosis.  Pharmacy to manage IV heparin while waiting CABG.  Heparin level therapeutic; no bleeding reported.  Goal of Therapy:  Heparin level 0.3-0.7 units/ml Monitor platelets by anticoagulation protocol: Yes    Plan:  Continue heparin gtt at 1350 units/hr Daily heparin level and CBC   Orman Matsumura D. Mina Marble, PharmD, BCPS Pager:  [phone] 12/28/2015, 7:22 AM

## 2015-12-28 NOTE — Progress Notes (Signed)
PROGRESS NOTE    Kurt Mathis  [id number] DOB: Sep 12, 1960 DOA: 12/23/2015 PCP: Sanda Klein, MD    Brief Narrative:  55 y/o presengint with Non Stemi and newly diagnosed DM. [facility] consulted for diabetes management.  Assessment & Plan:   NSTEMI (non-ST elevated myocardial infarction) [facility]) -Cardiology on board and recommending catheterization/coronary angiography and resvascluarization as indicated. -plan is for CABG on 01/01/16 -after 72 hours heparin drip discontinued. No CP -continue B-blocker and low dose lisinopril -plan is for plavix for 1 year after CABG  Diabetes mellitus type 2, uncontrolled, with complications ([place]) -Once ready for discharge will use levemir (as patient w/o insurance) -might be able to use amaryl BID along with long acting insulin -discussed needs for modified carb diet and not skipping meals -long acting insulin adjusted to 14 units for better control -continue SSI  Essential hypertension -Currently normotensive on metoprolol and low dose lisinopril  -advise to follow low sodium diet   HLD -continue statins   DVT prophylaxis: On heparin Code Status: Full Family Communication: None at bedside Disposition Plan: Pending improvement in condition   Consultants:   Cardiology   Procedures:   CABG planned for 01/01/16   Antimicrobials: None   Subjective: Afebrile, in no distress. Patient is AAOX3. No nausea, no vomiting. Denies CP currently.  Objective: Vitals:   12/27/15 0346 12/27/15 2142 12/28/15 0349 12/28/15 1101  BP: 113/64 120/67 112/62 114/64  Pulse: 70 82 69 79  Resp: 20 18 20    Temp: 98.2 F (36.8 C) 98 F (36.7 C) 98.5 F (36.9 C)   TempSrc: Oral Oral Oral   SpO2: 98% 95% 99%   Weight: 72.9 kg (160 lb 11.2 oz)  72.6 kg (160 lb 0.6 oz)   Height:        Intake/Output Summary (Last 24 hours) at 12/28/15 1423 Last data filed at 12/28/15 0500  Gross per 24 hour  Intake           790.51 ml  Output                 0 ml  Net           790.51 ml   Filed Weights   12/26/15 0500 12/27/15 0346 12/28/15 0349  Weight: 73.4 kg (161 lb 13.1 oz) 72.9 kg (160 lb 11.2 oz) 72.6 kg (160 lb 0.6 oz)    Examination: General exam: Appears calm and comfortable, in nad. Denies SOB/orthopnea and CP currently. Respiratory system: Clear to auscultation. Respiratory effort normal. Cardiovascular system: S1 & S2 heard, RRR. No rubs, no murmurs Gastrointestinal system: Abdomen is nondistended, soft and nontender. No organomegaly or masses felt. Normal bowel sounds heard. Central nervous system: Alert and oriented. No focal neurological deficits. Extremities: Symmetric 5 x 5 power. Skin: No rashes, lesions or ulcers Psychiatry: Judgement and insight appear normal. Mood & affect appropriate.   Data Reviewed: I have personally reviewed following labs and imaging studies  CBC:  Recent Labs Lab 12/24/15 0447 12/25/15 0239 12/25/15 1513 12/26/15 1635 12/28/15 0216  WBC 8.7 8.8 6.5 7.5 8.0  HGB 14.2 14.0 13.8 15.1 13.6  HCT 42.2 41.9 41.1 44.3 41.2  MCV 90.2 90.1 89.5 89.7 90.9  PLT 289 311 314 357 [id number]   Basic Metabolic Panel:  Recent Labs Lab 12/23/15 1100 12/23/15 1200 12/25/15 1513 12/26/15 1635  NA 136 138  --  138  K 4.0 3.9  --  4.1  CL 101 103  --  103  CO2 26 25  --  28  GLUCOSE 309* 290*  --  148*  BUN 13 13  --  9  CREATININE 0.74 0.64 0.61 0.74  CALCIUM 9.3 8.8*  --  9.5  MG  --  1.8  --   --    GFR: Estimated Creatinine Clearance: 107.1 mL/min (by C-G formula based on SCr of 0.74 mg/dL).   Liver Function Tests:  Recent Labs Lab 12/23/15 1200  AST 48*  ALT 40  ALKPHOS 74  BILITOT 0.7  PROT 7.2  ALBUMIN 3.9   Coagulation Profile:  Recent Labs Lab 12/23/15 1200  INR 1.07   Cardiac Enzymes:  Recent Labs Lab 12/23/15 1200 12/24/15 0156  TROPONINI 5.93* 6.97*   CBG:  Recent Labs Lab 12/27/15 1206 12/27/15 1619 12/27/15 2127 12/28/15 0608 12/28/15 1113    GLUCAP 179* 136* 200* 170* 208*   Sepsis Labs: No results for input(s): PROCALCITON, LATICACIDVEN in the last 168 hours.  Recent Results (from the past 240 hour(s))  MRSA PCR Screening     Status: None   Collection Time: 12/23/15  3:30 PM  Result Value Ref Range Status   MRSA by PCR NEGATIVE NEGATIVE Final    Comment:        The GeneXpert MRSA Assay (FDA approved for NASAL specimens only), is one component of a comprehensive MRSA colonization surveillance program. It is not intended to diagnose MRSA infection nor to guide or monitor treatment for MRSA infections.      Radiology Studies: No results found.  Scheduled Meds: . aspirin EC  81 mg Oral Daily  . atorvastatin  80 mg Oral q1800  . insulin aspart  0-15 Units Subcutaneous TID WC  . insulin glargine  14 Units Subcutaneous QHS  . lisinopril  2.5 mg Oral Daily  . metoprolol tartrate  25 mg Oral BID  . sodium chloride flush  3 mL Intravenous Q12H   Continuous Infusions:     LOS: 5 days    Time spent: > 20 minutes    Barton Dubois, MD [facility] Pager [phone]  If 7PM-7AM, please contact night-coverage [email or web address] Password [facility] 12/28/2015, 2:23 PM

## 2015-12-28 NOTE — Progress Notes (Signed)
Patient Name: Kurt Mathis Date of Encounter: 12/28/2015  Principal Problem:   NSTEMI (non-ST elevated myocardial infarction) [facility]) Active Problems:   Diabetes mellitus type 2, uncontrolled, with complications ([place])   Essential hypertension   HLD (hyperlipidemia)   Length of Stay: 5  SUBJECTIVE  Angina free at rest. Had a vague mild discomfort when he was worrying, resolved spontaneously. Noticed a little dizziness and weakness walking (after I increased his CHF meds).  CURRENT MEDS . aspirin EC  81 mg Oral Daily  . atorvastatin  80 mg Oral q1800  . insulin aspart  0-15 Units Subcutaneous TID WC  . insulin glargine  12 Units Subcutaneous QHS  . lisinopril  2.5 mg Oral Daily  . metoprolol tartrate  25 mg Oral BID  . sodium chloride flush  3 mL Intravenous Q12H    OBJECTIVE   Intake/Output Summary (Last 24 hours) at 12/28/15 1047 Last data filed at 12/28/15 0500  Gross per 24 hour  Intake           790.51 ml  Output                0 ml  Net           790.51 ml   Filed Weights   12/26/15 0500 12/27/15 0346 12/28/15 0349  Weight: 73.4 kg (161 lb 13.1 oz) 72.9 kg (160 lb 11.2 oz) 72.6 kg (160 lb 0.6 oz)    PHYSICAL EXAM Vitals:   12/26/15 2307 12/27/15 0346 12/27/15 2142 12/28/15 0349  BP: 113/65 113/64 120/67 112/62  Pulse: 81 70 82 69  Resp: 18 20 18 20   Temp: 98.1 F (36.7 C) 98.2 F (36.8 C) 98 F (36.7 C) 98.5 F (36.9 C)  TempSrc: Oral Oral Oral Oral  SpO2: 98% 98% 95% 99%  Weight:  72.9 kg (160 lb 11.2 oz)  72.6 kg (160 lb 0.6 oz)  Height:       General: Alert, oriented x3, no distress Head: no evidence of trauma, PERRL, EOMI, no exophtalmos or lid lag, no myxedema, no xanthelasma; normal ears, nose and oropharynx Neck: normal jugular venous pulsations and no hepatojugular reflux; brisk carotid pulses without delay and no carotid bruits Chest: clear to auscultation, no signs of consolidation by percussion or palpation, normal fremitus,  symmetrical and full respiratory excursions Cardiovascular: normal position and quality of the apical impulse, regular rhythm, normal first and second heart sounds, no rubs or gallops, no murmur Abdomen: no tenderness or distention, no masses by palpation, no abnormal pulsatility or arterial bruits, normal bowel sounds, no hepatosplenomegaly Extremities: no clubbing, cyanosis or edema; 2+ radial, ulnar and brachial pulses bilaterally; 2+ right femoral, posterior tibial and dorsalis pedis pulses; 2+ left femoral, posterior tibial and dorsalis pedis pulses; no subclavian or femoral bruits Neurological: grossly nonfocal  LABS  CBC  Recent Labs  12/26/15 1635 12/28/15 0216  WBC 7.5 8.0  HGB 15.1 13.6  HCT 44.3 41.2  MCV 89.7 90.9  PLT 357 [id number]   Basic Metabolic Panel  Recent Labs  12/25/15 1513 12/26/15 1635  NA  --  138  K  --  4.1  CL  --  103  CO2  --  28  GLUCOSE  --  148*  BUN  --  9  CREATININE 0.61 0.74  CALCIUM  --  9.5    Radiology Studies Imaging results have been reviewed and No results found.  TELE NSR  ASSESSMENT AND PLAN   1. NSTEMI: no furtherangina. Has been  on IV heparin > 72 hours. Will DC, but restart if angina recurs. Clopidogrel after CABG for 12 months. 2. 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 3. Ischemic cardiomyopathy:mildly depressed EF without overt CHF and with minimally increased LVEDP at cath (18 mm Hg). Added low dose ACE inhibitor to the beta blocker. Will hold off further increases until after surgery. 4. DM: newly diagnosed, but likely long lasting. A1c 12%. FSBS improving steadily on insulin 5. HLP: LDL around 100. Plan high dose atorvastatin for a year and reevaluate long term lipid lowering therapy based on follow up results. Target LDL<70. Diet and exercise discussed. 6. Social issues: He reports being uninsured at this time after losing his job. Need to keep that in mind when choosing DC meds. Refer for orange card.   Sanda Klein,  MD, [facility] [phone] office [phone] pager 12/28/2015 10:47 AM

## 2015-12-29 ENCOUNTER — Inpatient Hospital Stay (HOSPITAL_COMMUNITY): Payer: Self-pay

## 2015-12-29 DIAGNOSIS — Z01810 Encounter for preprocedural cardiovascular examination: Secondary | ICD-10-CM

## 2015-12-29 DIAGNOSIS — I2510 Atherosclerotic heart disease of native coronary artery without angina pectoris: Secondary | ICD-10-CM

## 2015-12-29 LAB — VAS US DOPPLER PRE CABG
LCCADDIAS: 22 cm/s
LCCADSYS: 93 cm/s
LCCAPDIAS: 18 cm/s
LEFT ECA DIAS: 12 cm/s
LEFT VERTEBRAL DIAS: 14 cm/s
LICADDIAS: -33 cm/s
LICAPDIAS: -29 cm/s
LICAPSYS: -104 cm/s
Left CCA prox sys: 104 cm/s
Left ICA dist sys: -106 cm/s
RCCAPDIAS: 16 cm/s
RCCAPSYS: 77 cm/s
RIGHT ECA DIAS: 9 cm/s
RIGHT VERTEBRAL DIAS: 16 cm/s
Right cca dist sys: -104 cm/s

## 2015-12-29 LAB — SPIROMETRY WITH GRAPH
FEF 25-75 PRE: 2.77 L/s
FEF 25-75 Post: 3.49 L/sec
FEF2575-%Change-Post: 26 %
FEF2575-%Pred-Post: 105 %
FEF2575-%Pred-Pre: 83 %
FEV1-%CHANGE-POST: 6 %
FEV1-%PRED-POST: 87 %
FEV1-%PRED-PRE: 81 %
FEV1-POST: 3.4 L
FEV1-PRE: 3.19 L
FEV1FVC-%Change-Post: 3 %
FEV1FVC-%Pred-Pre: 101 %
FEV6-%CHANGE-POST: 3 %
FEV6-%PRED-POST: 85 %
FEV6-%Pred-Pre: 82 %
FEV6-POST: 4.2 L
FEV6-PRE: 4.04 L
FEV6FVC-%CHANGE-POST: 0 %
FEV6FVC-%PRED-POST: 104 %
FEV6FVC-%PRED-PRE: 103 %
FVC-%CHANGE-POST: 2 %
FVC-%PRED-POST: 82 %
FVC-%Pred-Pre: 80 %
FVC-Post: 4.2 L
FVC-Pre: 4.1 L
PRE FEV6/FVC RATIO: 99 %
Post FEV1/FVC ratio: 81 %
Post FEV6/FVC ratio: 100 %
Pre FEV1/FVC ratio: 78 %

## 2015-12-29 LAB — GLUCOSE, CAPILLARY
Glucose-Capillary: 137 mg/dL — ABNORMAL HIGH (ref 65–99)
Glucose-Capillary: 189 mg/dL — ABNORMAL HIGH (ref 65–99)
Glucose-Capillary: 223 mg/dL — ABNORMAL HIGH (ref 65–99)

## 2015-12-29 MED ORDER — ALBUTEROL SULFATE (2.5 MG/3ML) 0.083% IN NEBU
2.5000 mg | INHALATION_SOLUTION | Freq: Once | RESPIRATORY_TRACT | Status: AC
  Administered 2015-12-29: 2.5 mg via RESPIRATORY_TRACT

## 2015-12-29 NOTE — Progress Notes (Signed)
Patient Name: Kurt Mathis Date of Encounter: 12/29/2015  Principal Problem:   NSTEMI (non-ST elevated myocardial infarction) [facility]) Active Problems:   Diabetes mellitus type 2, uncontrolled, with complications ([place])   Essential hypertension   HLD (hyperlipidemia)   Length of Stay: 6  SUBJECTIVE  No further episodes of chest pain. Sitting up in the chair talking with his family.   CURRENT MEDS . aspirin EC  81 mg Oral Daily  . atorvastatin  80 mg Oral q1800  . insulin aspart  0-15 Units Subcutaneous TID WC  . insulin glargine  14 Units Subcutaneous QHS  . lisinopril  2.5 mg Oral Daily  . metoprolol tartrate  25 mg Oral BID  . sodium chloride flush  3 mL Intravenous Q12H    OBJECTIVE   Intake/Output Summary (Last 24 hours) at 12/29/15 1141 Last data filed at 12/28/15 2100  Gross per 24 hour  Intake              480 ml  Output                0 ml  Net              480 ml   Filed Weights   12/27/15 0346 12/28/15 0349 12/29/15 0438  Weight: 160 lb 11.2 oz (72.9 kg) 160 lb 0.6 oz (72.6 kg) 159 lb 6.4 oz (72.3 kg)    PHYSICAL EXAM Vitals:   12/28/15 1101 12/28/15 2134 12/29/15 0438 12/29/15 0837  BP: 114/64 127/72 113/68 107/72  Pulse: 79 79 71 69  Resp:  18 18   Temp:  97.5 F (36.4 C) 98.5 F (36.9 C)   TempSrc:  Oral Oral   SpO2:  99% 99%   Weight:   159 lb 6.4 oz (72.3 kg)   Height:       General: Alert, oriented x3, no distress Head: no evidence of trauma, PERRL, EOMI, no exophtalmos or lid lag, no myxedema, no xanthelasma; normal ears, nose and oropharynx Neck: normal jugular venous pulsations and no hepatojugular reflux; brisk carotid pulses without delay and no carotid bruits Chest: clear to auscultation, no signs of consolidation by percussion or palpation, normal fremitus, symmetrical and full respiratory excursions Cardiovascular: normal position and quality of the apical impulse, regular rhythm, normal first and second heart sounds, no rubs or  gallops, no murmur Abdomen: no tenderness or distention, no masses by palpation, no abnormal pulsatility or arterial bruits, normal bowel sounds, no hepatosplenomegaly Extremities: no clubbing, cyanosis or edema; 2+ radial, ulnar and brachial pulses bilaterally; 2+ right femoral, posterior tibial and dorsalis pedis pulses; 2+ left femoral, posterior tibial and dorsalis pedis pulses; no subclavian or femoral bruits Neurological: grossly nonfocal  LABS  CBC  Recent Labs  12/26/15 1635 12/28/15 0216  WBC 7.5 8.0  HGB 15.1 13.6  HCT 44.3 41.2  MCV 89.7 90.9  PLT 357 [id number]   Basic Metabolic Panel  Recent Labs  12/26/15 1635  NA 138  K 4.1  CL 103  CO2 28  GLUCOSE 148*  BUN 9  CREATININE 0.74  CALCIUM 9.5    Radiology Studies Imaging results have been reviewed and No results found.  TELE NSR  ASSESSMENT AND PLAN   1. NSTEMI: no furtherangina. Heparin was dc'ed yesterday. Clopidogrel after CABG for 12 months. 2. 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 3. Ischemic cardiomyopathy:mildly depressed EF without overt CHF and with minimally increased LVEDP at cath (18 mm Hg). Added low dose ACE inhibitor to the beta blocker. Will  hold off further increases until after surgery. 4. DM: newly diagnosed, but likely long lasting. A1c 12%. FSBS improving steadily on insulin. 5. HLP: LDL around 100. Plan high dose atorvastatin for a year and reevaluate long term lipid lowering therapy based on follow up results. Target LDL<70. Diet and exercise discussed. 6. Social issues: He reports being uninsured at this time after losing his job. Need to keep that in mind when choosing DC meds. Refer for orange card.  Reino Bellis NP-C 12/29/2015 11:41 AM  I have seen and examined the patient along with Reino Bellis, NP-C.  I have reviewed the chart, notes and new data.  I agree with NP's note.  Key new complaints: no angina, no severe dizziness Key examination changes: no overt  CHF   PLAN: For CABG Monday (earliest available schedule day).  Sanda Klein, MD, [place] [phone] 12/29/2015, 11:54 AM

## 2015-12-29 NOTE — Progress Notes (Signed)
PROGRESS NOTE    Kurt Mathis  [id number] DOB: 12-30-1960 DOA: 12/23/2015 PCP: Sanda Klein, MD    Brief Narrative:  55 y/o presengint with Non Stemi and newly diagnosed DM. TRH consulted for diabetes management.  Assessment & Plan:   NSTEMI (non-ST elevated myocardial infarction) [facility]) -Cardiology on board and recommending catheterization/coronary angiography and resvascluarization as indicated. -plan is for CABG on 01/01/16 -after 72 hours heparin drip discontinued. No CP -continue B-blocker and low dose lisinopril -plan is for plavix for 1 year after CABG  Diabetes mellitus type 2, uncontrolled, with complications ([place]) -Once ready for discharge will use levemir (as patient w/o insurance) -might be able to use amaryl BID along with long acting insulin -discussed needs for modified carb diet and not skipping meals -long acting insulin adjusted to 14 units daily; CBG's has now been < 180. -continue SSI  Essential hypertension -Currently normotensive on metoprolol and low dose lisinopril  -advise to follow low sodium diet   HLD -continue statins   DVT prophylaxis: On heparin Code Status: Full Family Communication: None at bedside Disposition Plan: Pending improvement in condition   Consultants:   Cardiology   Procedures:   CABG planned for 01/01/16   Antimicrobials: None   Subjective: Afebrile, in no distress. Patient is AAOX3. No nausea, no vomiting. Denies CP and SOB.  Objective: Vitals:   12/28/15 1101 12/28/15 2134 12/29/15 0438 12/29/15 0837  BP: 114/64 127/72 113/68 107/72  Pulse: 79 79 71 69  Resp:  18 18   Temp:  97.5 F (36.4 C) 98.5 F (36.9 C)   TempSrc:  Oral Oral   SpO2:  99% 99%   Weight:   72.3 kg (159 lb 6.4 oz)   Height:        Intake/Output Summary (Last 24 hours) at 12/29/15 0910 Last data filed at 12/28/15 2100  Gross per 24 hour  Intake              480 ml  Output                0 ml  Net              480 ml     Filed Weights   12/27/15 0346 12/28/15 0349 12/29/15 0438  Weight: 72.9 kg (160 lb 11.2 oz) 72.6 kg (160 lb 0.6 oz) 72.3 kg (159 lb 6.4 oz)    Examination: General exam: Appears calm and comfortable, in nad. Denies SOB/orthopnea and CP. hving PFT's this morning Respiratory system: Clear to auscultation. Respiratory effort normal. Cardiovascular system: S1 & S2 heard, RRR. No rubs, no murmurs Gastrointestinal system: Abdomen is nondistended, soft and nontender. No organomegaly or masses felt. Normal bowel sounds heard. Central nervous system: Alert and oriented. No focal neurological deficits. Extremities: Symmetric 5 x 5 power. Skin: No rashes, lesions or ulcers Psychiatry: Judgement and insight appear normal. Mood & affect appropriate.   Data Reviewed: I have personally reviewed following labs and imaging studies  CBC:  Recent Labs Lab 12/24/15 0447 12/25/15 0239 12/25/15 1513 12/26/15 1635 12/28/15 0216  WBC 8.7 8.8 6.5 7.5 8.0  HGB 14.2 14.0 13.8 15.1 13.6  HCT 42.2 41.9 41.1 44.3 41.2  MCV 90.2 90.1 89.5 89.7 90.9  PLT 289 311 314 357 [id number]   Basic Metabolic Panel:  Recent Labs Lab 12/23/15 1100 12/23/15 1200 12/25/15 1513 12/26/15 1635  NA 136 138  --  138  K 4.0 3.9  --  4.1  CL 101 103  --  103  CO2 26 25  --  28  GLUCOSE 309* 290*  --  148*  BUN 13 13  --  9  CREATININE 0.74 0.64 0.61 0.74  CALCIUM 9.3 8.8*  --  9.5  MG  --  1.8  --   --    GFR: Estimated Creatinine Clearance: 106.7 mL/min (by C-G formula based on SCr of 0.74 mg/dL).   Liver Function Tests:  Recent Labs Lab 12/23/15 1200  AST 48*  ALT 40  ALKPHOS 74  BILITOT 0.7  PROT 7.2  ALBUMIN 3.9   Coagulation Profile:  Recent Labs Lab 12/23/15 1200  INR 1.07   Cardiac Enzymes:  Recent Labs Lab 12/23/15 1200 12/24/15 0156  TROPONINI 5.93* 6.97*   CBG:  Recent Labs Lab 12/28/15 0608 12/28/15 1113 12/28/15 1601 12/28/15 2120 12/29/15 0624  GLUCAP 170* 208* 193* 145*  137*   Sepsis Labs: No results for input(s): PROCALCITON, LATICACIDVEN in the last 168 hours.  Recent Results (from the past 240 hour(s))  MRSA PCR Screening     Status: None   Collection Time: 12/23/15  3:30 PM  Result Value Ref Range Status   MRSA by PCR NEGATIVE NEGATIVE Final    Comment:        The GeneXpert MRSA Assay (FDA approved for NASAL specimens only), is one component of a comprehensive MRSA colonization surveillance program. It is not intended to diagnose MRSA infection nor to guide or monitor treatment for MRSA infections.      Radiology Studies: No results found.  Scheduled Meds: . aspirin EC  81 mg Oral Daily  . atorvastatin  80 mg Oral q1800  . insulin aspart  0-15 Units Subcutaneous TID WC  . insulin glargine  14 Units Subcutaneous QHS  . lisinopril  2.5 mg Oral Daily  . metoprolol tartrate  25 mg Oral BID  . sodium chloride flush  3 mL Intravenous Q12H   Continuous Infusions:     LOS: 6 days    Time spent: > 20 minutes    Barton Dubois, MD [facility] Pager [phone]  If 7PM-7AM, please contact night-coverage [email or web address] Password [email or web address] 12/29/2015, 9:10 AM

## 2015-12-29 NOTE — Progress Notes (Signed)
Responded to consult. Visited with patient, wife and small son. Patient wishes prayer again before his surgery Monday and will page chaplain through nurse, possibly also on Sunday. Chaplain will follow up Monday.    12/29/15 1220  Clinical Encounter Type  Visited With Patient and family together  Visit Type Initial  Referral From Nurse  Spiritual Encounters  Spiritual Needs Prayer;Emotional  Stress Factors  Patient Stress Factors Family relationships;Health changes  Family Stress Factors Family relationships

## 2015-12-29 NOTE — Plan of Care (Signed)
Problem: Education: Goal: Knowledge of [facility] Education information/materials will improve Outcome: Progressing POC reviewed with pt.   

## 2015-12-29 NOTE — Progress Notes (Signed)
      [address]       [place] [address]             [phone]      No complaints, denies CP, SOB  BP (!) 163/59 (BP Location: Right Arm)   Pulse 66   Temp 98.2 F (36.8 C) (Oral)   Resp 18   Ht 5\' 10"  (1.778 m)   Wt 159 lb 6.4 oz (72.3 kg)   SpO2 95%   BMI 22.87 kg/m    Intake/Output Summary (Last 24 hours) at 12/29/15 1727 Last data filed at 12/28/15 2100  Gross per 24 hour  Intake              240 ml  Output                0 ml  Net              240 ml   For CABG Monday afternoon  All questions answered  Remo Lipps C. Roxan Hockey, MD [facility] [phone]

## 2015-12-30 DIAGNOSIS — I255 Ischemic cardiomyopathy: Secondary | ICD-10-CM

## 2015-12-30 LAB — GLUCOSE, CAPILLARY
GLUCOSE-CAPILLARY: 114 mg/dL — AB (ref 65–99)
GLUCOSE-CAPILLARY: 198 mg/dL — AB (ref 65–99)
GLUCOSE-CAPILLARY: 240 mg/dL — AB (ref 65–99)
Glucose-Capillary: 158 mg/dL — ABNORMAL HIGH (ref 65–99)
Glucose-Capillary: 162 mg/dL — ABNORMAL HIGH (ref 65–99)

## 2015-12-30 MED ORDER — LORAZEPAM 0.5 MG PO TABS
0.5000 mg | ORAL_TABLET | Freq: Four times a day (QID) | ORAL | Status: DC | PRN
  Administered 2015-12-31: 0.5 mg via ORAL
  Filled 2015-12-30: qty 1

## 2015-12-30 NOTE — Progress Notes (Signed)
PROGRESS NOTE    Kurt Mathis  [id number] DOB: December 04, 1960 DOA: 12/23/2015 PCP: Sanda Klein, MD    Brief Narrative:  55 y/o presengint with Non Stemi and newly diagnosed DM. TRH consulted for diabetes management.  Assessment & Plan:   NSTEMI (non-ST elevated myocardial infarction) [facility]) -Cardiology on board and recommending catheterization/coronary angiography and resvascluarization as indicated. -plan is for CABG on 01/01/16 -after 72 hours heparin drip discontinued. No CP -continue B-blocker and low dose lisinopril -plan is for plavix for 1 year after CABG  Ischemic cardiomyopathy: with EF 45-50% -per cardiology started on ACE and B-blocker low dose -discussed low sodium diet and daily weights monitoring   Diabetes mellitus type 2, uncontrolled, with complications ([place]) -Once ready for discharge will use levemir (as patient w/o insurance) -might be able to use amaryl BID along with long acting insulin -discussed needs for modified carb diet and not skipping meals -long acting insulin adjusted to 14 units daily; CBG's has now been < 180. -continue SSI  Essential hypertension -Currently normotensive on metoprolol and low dose lisinopril  -advise to follow low sodium diet   HLD -continue statins   DVT prophylaxis: On heparin Code Status: Full Family Communication: None at bedside Disposition Plan: Pending improvement in condition   Consultants:   Cardiology   Procedures:   CABG planned for 01/01/16   Antimicrobials: None   Subjective: Afebrile, in no distress. Patient is AAOX3, anxious. No nausea, no vomiting. Denies CP and SOB.  Objective: Vitals:   12/29/15 1434 12/29/15 2114 12/29/15 2147 12/30/15 0444  BP: (!) 163/59 132/71 119/68 114/67  Pulse: 66 86 79 67  Resp: 18 18  12   Temp: 98.2 F (36.8 C) 98.6 F (37 C)  98.3 F (36.8 C)  TempSrc: Oral Oral  Oral  SpO2: 95% 99%  98%  Weight:    72 kg (158 lb 11.2 oz)  Height:         Intake/Output Summary (Last 24 hours) at 12/30/15 1324 Last data filed at 12/29/15 2115  Gross per 24 hour  Intake              480 ml  Output                0 ml  Net              480 ml   Filed Weights   12/28/15 0349 12/29/15 0438 12/30/15 0444  Weight: 72.6 kg (160 lb 0.6 oz) 72.3 kg (159 lb 6.4 oz) 72 kg (158 lb 11.2 oz)    Examination: General exam: Appears calm and comfortable, in nad. Patient with some anxiety given hospitalization, diagnosis and upcoming surgery. Denies SOB/orthopnea and CP.  Respiratory system: Clear to auscultation. Respiratory effort normal. Cardiovascular system: S1 & S2 heard, RRR. No rubs, no murmurs Gastrointestinal system: Abdomen is nondistended, soft and nontender. No organomegaly or masses felt. Normal bowel sounds heard. Central nervous system: Alert and oriented. No focal neurological deficits. Extremities: Symmetric 5 x 5 power. Skin: No rashes, lesions or ulcers Psychiatry: Judgement and insight appear normal. Mood & affect appropriate.   Data Reviewed: I have personally reviewed following labs and imaging studies  CBC:  Recent Labs Lab 12/24/15 0447 12/25/15 0239 12/25/15 1513 12/26/15 1635 12/28/15 0216  WBC 8.7 8.8 6.5 7.5 8.0  HGB 14.2 14.0 13.8 15.1 13.6  HCT 42.2 41.9 41.1 44.3 41.2  MCV 90.2 90.1 89.5 89.7 90.9  PLT 289 311 314 357 [id number]   Basic Metabolic  Panel:  Recent Labs Lab 12/25/15 1513 12/26/15 1635  NA  --  138  K  --  4.1  CL  --  103  CO2  --  28  GLUCOSE  --  148*  BUN  --  9  CREATININE 0.61 0.74  CALCIUM  --  9.5   GFR: Estimated Creatinine Clearance: 106.3 mL/min (by C-G formula based on SCr of 0.74 mg/dL).   Cardiac Enzymes:  Recent Labs Lab 12/24/15 0156  TROPONINI 6.97*   CBG:  Recent Labs Lab 12/29/15 1110 12/29/15 2110 12/30/15 0551 12/30/15 0753 12/30/15 1119  GLUCAP 189* 223* 158* 114* 198*   Sepsis Labs: No results for input(s): PROCALCITON, LATICACIDVEN in the last  168 hours.  Recent Results (from the past 240 hour(s))  MRSA PCR Screening     Status: None   Collection Time: 12/23/15  3:30 PM  Result Value Ref Range Status   MRSA by PCR NEGATIVE NEGATIVE Final    Comment:        The GeneXpert MRSA Assay (FDA approved for NASAL specimens only), is one component of a comprehensive MRSA colonization surveillance program. It is not intended to diagnose MRSA infection nor to guide or monitor treatment for MRSA infections.      Radiology Studies: No results found.  Scheduled Meds: . aspirin EC  81 mg Oral Daily  . atorvastatin  80 mg Oral q1800  . insulin aspart  0-15 Units Subcutaneous TID WC  . insulin glargine  14 Units Subcutaneous QHS  . lisinopril  2.5 mg Oral Daily  . metoprolol tartrate  25 mg Oral BID  . sodium chloride flush  3 mL Intravenous Q12H   Continuous Infusions:     LOS: 7 days    Time spent: > 20 minutes    Barton Dubois, MD [facility] Pager [phone]  If 7PM-7AM, please contact night-coverage [email or web address] Password [email or web address] 12/30/2015, 1:24 PM

## 2015-12-30 NOTE — Progress Notes (Signed)
   12/30/15 1555  Clinical Encounter Type  Visited With Patient  Visit Type Follow-up  Referral From Chaplain  Spiritual Encounters  Spiritual Needs Emotional  Stress Factors  Patient Stress Factors Family relationships  Pt would prefer prayer on Sunday for his Monday surgery. Pt had concerns about his son's emotional state. Provided emotional support to Pt. Will have Sunday chaplain provide prayer to Pt.

## 2015-12-30 NOTE — Progress Notes (Signed)
  Patient Name: Kurt Mathis Date of Encounter: 12/30/2015  Principal Problem:   NSTEMI (non-ST elevated myocardial infarction) [facility]) Active Problems:   Diabetes mellitus type 2, uncontrolled, with complications ([place])   Essential hypertension   HLD (hyperlipidemia)   Coronary artery disease involving native coronary artery of native heart without angina pectoris   Length of Stay: 7  SUBJECTIVE  No further episodes of chest pain.  For CABG monday  Anxious  EF 45-50%    CURRENT MEDS . aspirin EC  81 mg Oral Daily  . atorvastatin  80 mg Oral q1800  . insulin aspart  0-15 Units Subcutaneous TID WC  . insulin glargine  14 Units Subcutaneous QHS  . lisinopril  2.5 mg Oral Daily  . metoprolol tartrate  25 mg Oral BID  . sodium chloride flush  3 mL Intravenous Q12H    OBJECTIVE   Intake/Output Summary (Last 24 hours) at 12/30/15 0907 Last data filed at 12/29/15 2115  Gross per 24 hour  Intake              480 ml  Output                0 ml  Net              480 ml   Filed Weights   12/28/15 0349 12/29/15 0438 12/30/15 0444  Weight: 160 lb 0.6 oz (72.6 kg) 159 lb 6.4 oz (72.3 kg) 158 lb 11.2 oz (72 kg)    PHYSICAL EXAM Vitals:   12/29/15 1434 12/29/15 2114 12/29/15 2147 12/30/15 0444  BP: (!) 163/59 132/71 119/68 114/67  Pulse: 66 86 79 67  Resp: 18 18  12   Temp: 98.2 F (36.8 C) 98.6 F (37 C)  98.3 F (36.8 C)  TempSrc: Oral Oral  Oral  SpO2: 95% 99%  98%  Weight:    158 lb 11.2 oz (72 kg)  Height:       Well developed and nourished in no acute distress HENT normal Neck supple with JVP-flat Clear Regular rate and rhythm, no murmurs or gallops Abd-soft with active BS No Clubbing cyanosis edema Skin-warm and dry A & Oriented  Grossly normal sensory and motor function   LABS  CBC  Recent Labs  12/28/15 0216  WBC 8.0  HGB 13.6  HCT 41.2  MCV 90.9  PLT [id number]   Basic Metabolic Panel No results for input(s): NA, K, CL, CO2, GLUCOSE, BUN,  CREATININE, CALCIUM, MG, PHOS in the last 72 hours.  Radiology Studies Imaging results have been reviewed and No results found.  TELE NSR  ASSESSMENT AND PLAN   1. NSTEMI: no furtherangina. Heparin was dc'ed yesterday. Clopidogrel after CABG for 12 months. 2. 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 3. Ischemic cardiomyopathy:mildly depressed EF without overt CHF and with minimally increased LVEDP at cath (18 mm Hg). Added low dose ACE inhibitor to the beta blocker. Will hold off further increases until after surgery. 4. DM: newly diagnosed, but likely long lasting. A1c 12%. FSBS improving steadily on insulin. 5. HLP: LDL around 100. Plan high dose atorvastatin for a year and reevaluate long term lipid lowering therapy based on follow up results. Target LDL<70. Diet and exercise discussed. 6. Social issues: He reports being uninsured at this time after losing his job. Need to keep that in mind when choosing DC meds. Refer for orange card. 7. Anxiety  Virl Axe  12/30/2015, 9:07 AM

## 2015-12-31 LAB — BASIC METABOLIC PANEL
Anion gap: 9 (ref 5–15)
BUN: 13 mg/dL (ref 6–20)
CALCIUM: 9.2 mg/dL (ref 8.9–10.3)
CHLORIDE: 99 mmol/L — AB (ref 101–111)
CO2: 25 mmol/L (ref 22–32)
CREATININE: 0.69 mg/dL (ref 0.61–1.24)
GFR calc non Af Amer: >60 mL/min (ref >60)
Glucose, Bld: 231 mg/dL — ABNORMAL HIGH (ref 65–99)
Potassium: 4 mmol/L (ref 3.5–5.1)
Sodium: 133 mmol/L — ABNORMAL LOW (ref 135–145)

## 2015-12-31 LAB — GLUCOSE, CAPILLARY
GLUCOSE-CAPILLARY: 207 mg/dL — AB (ref 65–99)
GLUCOSE-CAPILLARY: 151 mg/dL — AB (ref 65–99)
Glucose-Capillary: 118 mg/dL — ABNORMAL HIGH (ref 65–99)
Glucose-Capillary: 163 mg/dL — ABNORMAL HIGH (ref 65–99)

## 2015-12-31 LAB — CBC
HCT: 41.2 % (ref 39.0–52.0)
Hemoglobin: 13.8 g/dL (ref 13.0–17.0)
MCH: 30.3 pg (ref 26.0–34.0)
MCHC: 33.5 g/dL (ref 30.0–36.0)
MCV: 90.5 fL (ref 78.0–100.0)
PLATELETS: 379 10*3/uL (ref 150–400)
RBC: 4.55 MIL/uL (ref 4.22–5.81)
RDW: 12.7 % (ref 11.5–15.5)
WBC: 7.6 10*3/uL (ref 4.0–10.5)

## 2015-12-31 LAB — URINE MICROSCOPIC-ADD ON
RBC / HPF: NONE SEEN RBC/hpf (ref 0–5)
WBC, UA: NONE SEEN WBC/hpf (ref 0–5)

## 2015-12-31 LAB — URINALYSIS, ROUTINE W REFLEX MICROSCOPIC
BILIRUBIN URINE: NEGATIVE
Glucose, UA: >1000 mg/dL — AB
Hgb urine dipstick: NEGATIVE
Ketones, ur: 15 mg/dL — AB
LEUKOCYTES UA: NEGATIVE
NITRITE: NEGATIVE
PH: 5.5 (ref 5.0–8.0)
Protein, ur: NEGATIVE mg/dL
SPECIFIC GRAVITY, URINE: 1.025 (ref 1.005–1.030)

## 2015-12-31 LAB — ABO/RH: ABO/RH(D): O POS

## 2015-12-31 MED ORDER — DIAZEPAM 5 MG PO TABS
5.0000 mg | ORAL_TABLET | Freq: Once | ORAL | Status: AC
  Administered 2016-01-01: 5 mg via ORAL
  Filled 2015-12-31: qty 1

## 2015-12-31 MED ORDER — CHLORHEXIDINE GLUCONATE CLOTH 2 % EX PADS
6.0000 | MEDICATED_PAD | Freq: Once | CUTANEOUS | Status: AC
  Administered 2015-12-31: 6 via TOPICAL

## 2015-12-31 MED ORDER — CEFUROXIME SODIUM 1.5 G IJ SOLR
1.5000 g | INTRAMUSCULAR | Status: AC
  Administered 2016-01-01: .75 g via INTRAVENOUS
  Administered 2016-01-01: 1.5 g via INTRAVENOUS
  Filled 2015-12-31: qty 1.5

## 2015-12-31 MED ORDER — SODIUM CHLORIDE 0.9 % IV SOLN
INTRAVENOUS | Status: DC
  Filled 2015-12-31: qty 30

## 2015-12-31 MED ORDER — SODIUM CHLORIDE 0.9 % IV SOLN
INTRAVENOUS | Status: AC
  Administered 2016-01-01: 69.8 mL/h via INTRAVENOUS
  Filled 2015-12-31: qty 40

## 2015-12-31 MED ORDER — BISACODYL 5 MG PO TBEC
5.0000 mg | DELAYED_RELEASE_TABLET | Freq: Once | ORAL | Status: AC
  Administered 2015-12-31: 5 mg via ORAL
  Filled 2015-12-31: qty 1

## 2015-12-31 MED ORDER — NITROGLYCERIN IN D5W 200-5 MCG/ML-% IV SOLN
2.0000 ug/min | INTRAVENOUS | Status: AC
  Administered 2016-01-01: 5 ug/min via INTRAVENOUS
  Filled 2015-12-31: qty 250

## 2015-12-31 MED ORDER — CHLORHEXIDINE GLUCONATE CLOTH 2 % EX PADS
6.0000 | MEDICATED_PAD | Freq: Once | CUTANEOUS | Status: AC
  Administered 2016-01-01: 6 via TOPICAL

## 2015-12-31 MED ORDER — DOPAMINE-DEXTROSE 3.2-5 MG/ML-% IV SOLN
0.0000 ug/kg/min | INTRAVENOUS | Status: AC
  Administered 2016-01-01: 3 ug/kg/min via INTRAVENOUS
  Filled 2015-12-31: qty 250

## 2015-12-31 MED ORDER — DEXMEDETOMIDINE HCL IN NACL 400 MCG/100ML IV SOLN
0.1000 ug/kg/h | INTRAVENOUS | Status: AC
  Administered 2016-01-01: .3 ug/kg/h via INTRAVENOUS
  Filled 2015-12-31: qty 100

## 2015-12-31 MED ORDER — INSULIN GLARGINE 100 UNIT/ML ~~LOC~~ SOLN
16.0000 [IU] | Freq: Every day | SUBCUTANEOUS | Status: DC
  Administered 2015-12-31: 16 [IU] via SUBCUTANEOUS
  Filled 2015-12-31 (×2): qty 0.16

## 2015-12-31 MED ORDER — DEXTROSE 5 % IV SOLN
750.0000 mg | INTRAVENOUS | Status: DC
  Filled 2015-12-31: qty 750

## 2015-12-31 MED ORDER — TEMAZEPAM 15 MG PO CAPS: 15.0000 mg | ORAL_CAPSULE | Freq: Once | ORAL | Status: DC | PRN

## 2015-12-31 MED ORDER — VANCOMYCIN HCL 10 G IV SOLR
1250.0000 mg | INTRAVENOUS | Status: AC
  Administered 2016-01-01: 1250 mg via INTRAVENOUS
  Filled 2015-12-31: qty 1250

## 2015-12-31 MED ORDER — SODIUM CHLORIDE 0.9 % IV SOLN
INTRAVENOUS | Status: AC
  Administered 2016-01-01: 1 [IU]/h via INTRAVENOUS
  Filled 2015-12-31: qty 2.5

## 2015-12-31 MED ORDER — PLASMA-LYTE 148 IV SOLN
INTRAVENOUS | Status: AC
  Administered 2016-01-01: 500 mL
  Filled 2015-12-31 (×3): qty 2.5

## 2015-12-31 MED ORDER — DEXTROSE 5 % IV SOLN
0.0000 ug/min | INTRAVENOUS | Status: DC
  Filled 2015-12-31: qty 4

## 2015-12-31 MED ORDER — MAGNESIUM SULFATE 50 % IJ SOLN
40.0000 meq | INTRAMUSCULAR | Status: DC
  Filled 2015-12-31: qty 10

## 2015-12-31 MED ORDER — METOPROLOL TARTRATE 12.5 MG HALF TABLET
12.5000 mg | ORAL_TABLET | Freq: Once | ORAL | Status: AC
  Administered 2016-01-01: 12.5 mg via ORAL
  Filled 2015-12-31: qty 1

## 2015-12-31 MED ORDER — PHENYLEPHRINE HCL 10 MG/ML IJ SOLN
30.0000 ug/min | INTRAVENOUS | Status: DC
  Filled 2015-12-31: qty 2

## 2015-12-31 MED ORDER — CHLORHEXIDINE GLUCONATE 0.12 % MT SOLN
15.0000 mL | Freq: Once | OROMUCOSAL | Status: AC
  Administered 2016-01-01: 15 mL via OROMUCOSAL
  Filled 2015-12-31: qty 15

## 2015-12-31 MED ORDER — POTASSIUM CHLORIDE 2 MEQ/ML IV SOLN
80.0000 meq | INTRAVENOUS | Status: DC
  Filled 2015-12-31: qty 40

## 2015-12-31 NOTE — Progress Notes (Signed)
  Patient Name: Kurt Mathis Date of Encounter: 12/31/2015  Principal Problem:   NSTEMI (non-ST elevated myocardial infarction) [facility]) Active Problems:   Diabetes mellitus type 2, uncontrolled, with complications ([place])   Essential hypertension   HLD (hyperlipidemia)   Coronary artery disease involving native coronary artery of native heart without angina pectoris   Cardiomyopathy, ischemic   Length of Stay: 8  SUBJECTIVE  No further episodes of chest pain.  For CABG monday  Anxious--family drove from [place]   Saw daughter whom he had not sen in 15 yrs   EF 45-50%    CURRENT MEDS . aspirin EC  81 mg Oral Daily  . atorvastatin  80 mg Oral q1800  . insulin aspart  0-15 Units Subcutaneous TID WC  . insulin glargine  14 Units Subcutaneous QHS  . lisinopril  2.5 mg Oral Daily  . metoprolol tartrate  25 mg Oral BID  . sodium chloride flush  3 mL Intravenous Q12H    OBJECTIVE  No intake or output data in the 24 hours ending 12/31/15 0820 Filed Weights   12/29/15 0438 12/30/15 0444 12/31/15 0422  Weight: 159 lb 6.4 oz (72.3 kg) 158 lb 11.2 oz (72 kg) 158 lb 6.4 oz (71.8 kg)    PHYSICAL EXAM Vitals:   12/30/15 0444 12/30/15 1200 12/30/15 2030 12/31/15 0422  BP: 114/67 111/62 110/66 (!) 110/59  Pulse: 67 74 87 69  Resp: 12 18 16 20   Temp: 98.3 F (36.8 C) 98.4 F (36.9 C) 98.5 F (36.9 C) 97.7 F (36.5 C)  TempSrc: Oral Oral Oral Oral  SpO2: 98% 100% 99% 99%  Weight: 158 lb 11.2 oz (72 kg)   158 lb 6.4 oz (71.8 kg)  Height:       Well developed and nourished in no acute distress HENT normal Neck supple with JVP-flat Clear Regular rate and rhythm, no murmurs or gallops Abd-soft with active BS No Clubbing cyanosis edema Skin-warm and dry A & Oriented  Grossly normal sensory and motor function   LABS  CBC No results for input(s): WBC, NEUTROABS, HGB, HCT, MCV, PLT in the last 72 hours. Basic Metabolic Panel No results for input(s): NA, K, CL, CO2,  GLUCOSE, BUN, CREATININE, CALCIUM, MG, PHOS in the last 72 hours.  Radiology Studies Imaging results have been reviewed and No results found.  TELE NSR  ASSESSMENT AND PLAN   1. NSTEMI: no furtherangina.  . Clopidogrel after CABG for 12 months. 2. 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 3. Ischemic cardiomyopathy:mildly depressed EF without overt CHF and with minimally increased LVEDP at cath (18 mm Hg). Added low dose ACE inhibitor to the beta blocker  4. DM: newly diagnosed, but likely long lasting. A1c 12%. FSBS improving steadily on insulin. 5. HLP: LDL around 100. Plan high dose atorvastatin for a year and reevaluate long term lipid lowering therapy based on follow up results. Target LDL<70. Diet and exercise discussed. 6. Social issues: He reports being uninsured at this time after losing his job. Need to keep that in mind when choosing DC meds. Refer for orange card. 7. Anxiety    Blood work ordered  [employer]  12/31/2015, 8:20 AM

## 2015-12-31 NOTE — Progress Notes (Signed)
   12/31/15 2200  Clinical Encounter Type  Visited With Patient and family together  Visit Type Follow-up (prayer reqeust)  Referral From Nurse  Spiritual Encounters  Spiritual Needs Prayer;Emotional  Stress Factors  Patient Stress Factors Health changes;Loss of control;Major life changes  Family Stress Factors Health changes;Lack of knowledge  Chaplain received report for prayer request this evening with family prior to surgery tomorrow. Prayer and emotional support provided. Follow up to be made as needed.

## 2015-12-31 NOTE — Progress Notes (Signed)
PROGRESS NOTE    Kurt Mathis  [id number] DOB: May 03, 1960 DOA: 12/23/2015 PCP: Sanda Klein, MD    Brief Narrative:  55 y/o presengint with Non Stemi and newly diagnosed DM. TRH consulted for diabetes management.  Assessment & Plan:   NSTEMI (non-ST elevated myocardial infarction) [facility]) -Cardiology on board and recommending catheterization/coronary angiography and resvascluarization as indicated. -plan is for CABG on 01/01/16 -after 72 hours heparin drip discontinued. No CP -continue B-blocker and low dose lisinopril -plan is for plavix for 1 year after CABG  Ischemic cardiomyopathy: with EF 45-50% -per cardiology started on ACE and B-blocker low dose -discussed low sodium diet and daily weights monitoring   Diabetes mellitus type 2, uncontrolled, with complications ([place]) -Once ready for discharge will use levemir (as patient w/o insurance) -might be able to use amaryl BID along with long acting insulin -discussed needs for modified carb diet and not skipping meals -long acting insulin adjusted to 16 units daily, for better control -continue SSI  Essential hypertension -Currently normotensive on metoprolol and low dose lisinopril  -advise to follow low sodium diet   HLD -continue statins   DVT prophylaxis: On heparin Code Status: Full Family Communication: None at bedside Disposition Plan: Pending improvement in condition   Consultants:   Cardiology   Procedures:   CABG planned for 01/01/16   Antimicrobials: None   Subjective: Afebrile, in no distress. Patient is AAOX3, slightly anxious. No nausea, no vomiting. Denies CP and SOB. CBG's up to 240 yesterday.  Objective: Vitals:   12/30/15 0444 12/30/15 1200 12/30/15 2030 12/31/15 0422  BP: 114/67 111/62 110/66 (!) 110/59  Pulse: 67 74 87 69  Resp: 12 18 16 20   Temp: 98.3 F (36.8 C) 98.4 F (36.9 C) 98.5 F (36.9 C) 97.7 F (36.5 C)  TempSrc: Oral Oral Oral Oral  SpO2: 98% 100% 99% 99%   Weight: 72 kg (158 lb 11.2 oz)   71.8 kg (158 lb 6.4 oz)  Height:        Intake/Output Summary (Last 24 hours) at 12/31/15 0930 Last data filed at 12/31/15 0858  Gross per 24 hour  Intake              360 ml  Output                0 ml  Net              360 ml   Filed Weights   12/29/15 0438 12/30/15 0444 12/31/15 0422  Weight: 72.3 kg (159 lb 6.4 oz) 72 kg (158 lb 11.2 oz) 71.8 kg (158 lb 6.4 oz)    Examination: General exam: Appears calm and comfortable, in no distress. Patient with some anxiety given hospitalization, diagnosis and upcoming surgery tomorrow; will like to pray with chaplain. Denies SOB/orthopnea and CP.  Respiratory system: Clear to auscultation. Respiratory effort normal. Cardiovascular system: S1 & S2 heard, RRR. No rubs, no murmurs Gastrointestinal system: Abdomen is nondistended, soft and nontender. No organomegaly or masses felt. Normal bowel sounds heard. Central nervous system: Alert and oriented. No focal neurological deficits. Extremities: Symmetric 5 x 5 power. Skin: No rashes, lesions or ulcers Psychiatry: Judgement and insight appear normal. Mood & affect appropriate.   Data Reviewed: I have personally reviewed following labs and imaging studies  CBC:  Recent Labs Lab 12/25/15 0239 12/25/15 1513 12/26/15 1635 12/28/15 0216  WBC 8.8 6.5 7.5 8.0  HGB 14.0 13.8 15.1 13.6  HCT 41.9 41.1 44.3 41.2  MCV 90.1 89.5  89.7 90.9  PLT 311 314 357 [id number]   Basic Metabolic Panel:  Recent Labs Lab 12/25/15 1513 12/26/15 1635  NA  --  138  K  --  4.1  CL  --  103  CO2  --  28  GLUCOSE  --  148*  BUN  --  9  CREATININE 0.61 0.74  CALCIUM  --  9.5   GFR: Estimated Creatinine Clearance: 106.1 mL/min (by C-G formula based on SCr of 0.74 mg/dL).   CBG:  Recent Labs Lab 12/30/15 0753 12/30/15 1119 12/30/15 1550 12/30/15 2107 12/31/15 0613  GLUCAP 114* 198* 240* 162* 118*    Recent Results (from the past 240 hour(s))  MRSA PCR Screening      Status: None   Collection Time: 12/23/15  3:30 PM  Result Value Ref Range Status   MRSA by PCR NEGATIVE NEGATIVE Final    Comment:        The GeneXpert MRSA Assay (FDA approved for NASAL specimens only), is one component of a comprehensive MRSA colonization surveillance program. It is not intended to diagnose MRSA infection nor to guide or monitor treatment for MRSA infections.      Radiology Studies: No results found.  Scheduled Meds: . aspirin EC  81 mg Oral Daily  . atorvastatin  80 mg Oral q1800  . insulin aspart  0-15 Units Subcutaneous TID WC  . insulin glargine  16 Units Subcutaneous QHS  . lisinopril  2.5 mg Oral Daily  . metoprolol tartrate  25 mg Oral BID  . sodium chloride flush  3 mL Intravenous Q12H   Continuous Infusions:     LOS: 8 days    Time spent: > 20 minutes    Barton Dubois, MD [facility] Pager [phone]  If 7PM-7AM, please contact night-coverage [email or web address] Password [email or web address] 12/31/2015, 9:30 AM

## 2016-01-01 ENCOUNTER — Inpatient Hospital Stay (HOSPITAL_COMMUNITY): Payer: Self-pay | Admitting: Anesthesiology

## 2016-01-01 ENCOUNTER — Inpatient Hospital Stay (HOSPITAL_COMMUNITY): Payer: Self-pay

## 2016-01-01 ENCOUNTER — Encounter (HOSPITAL_COMMUNITY): Payer: Self-pay | Admitting: General Practice

## 2016-01-01 ENCOUNTER — Encounter (HOSPITAL_COMMUNITY)
Admission: EM | Disposition: A | Discharge status: Home / Self Care | Payer: Self-pay | Source: Home / Self Care | Attending: Cardiovascular Disease

## 2016-01-01 DIAGNOSIS — Z951 Presence of aortocoronary bypass graft: Secondary | ICD-10-CM

## 2016-01-01 DIAGNOSIS — I25110 Atherosclerotic heart disease of native coronary artery with unstable angina pectoris: Secondary | ICD-10-CM

## 2016-01-01 HISTORY — PX: CORONARY ARTERY BYPASS GRAFT: SHX141

## 2016-01-01 HISTORY — PX: RADIAL ARTERY HARVEST: SHX5067

## 2016-01-01 HISTORY — PX: TEE WITHOUT CARDIOVERSION: SHX5443

## 2016-01-01 LAB — POCT I-STAT, CHEM 8
BUN: 12 mg/dL (ref 6–20)
BUN: 10 mg/dL (ref 6–20)
BUN: 7 mg/dL (ref 6–20)
BUN: 10 mg/dL (ref 6–20)
BUN: 10 mg/dL (ref 6–20)
BUN: 10 mg/dL (ref 6–20)
CALCIUM ION: 0.86 mmol/L — AB (ref 1.15–1.40)
CALCIUM ION: 1.04 mmol/L — AB (ref 1.15–1.40)
CALCIUM ION: 1.08 mmol/L — AB (ref 1.15–1.40)
CHLORIDE: 98 mmol/L — AB (ref 101–111)
CHLORIDE: 101 mmol/L (ref 101–111)
CHLORIDE: 96 mmol/L — AB (ref 101–111)
CHLORIDE: 99 mmol/L — AB (ref 101–111)
CHLORIDE: 101 mmol/L (ref 101–111)
CREATININE: 0.5 mg/dL — AB (ref 0.61–1.24)
CREATININE: 0.3 mg/dL — AB (ref 0.61–1.24)
CREATININE: 0.4 mg/dL — AB (ref 0.61–1.24)
Calcium, Ion: 1.22 mmol/L (ref 1.15–1.40)
Calcium, Ion: 1.23 mmol/L (ref 1.15–1.40)
Calcium, Ion: 1.06 mmol/L — ABNORMAL LOW (ref 1.15–1.40)
Chloride: 101 mmol/L (ref 101–111)
Creatinine, Ser: <0.2 mg/dL — ABNORMAL LOW (ref 0.61–1.24)
Creatinine, Ser: 0.4 mg/dL — ABNORMAL LOW (ref 0.61–1.24)
Creatinine, Ser: 0.4 mg/dL — ABNORMAL LOW (ref 0.61–1.24)
GLUCOSE: 117 mg/dL — AB (ref 65–99)
GLUCOSE: 139 mg/dL — AB (ref 65–99)
GLUCOSE: 149 mg/dL — AB (ref 65–99)
Glucose, Bld: 114 mg/dL — ABNORMAL HIGH (ref 65–99)
Glucose, Bld: 89 mg/dL (ref 65–99)
Glucose, Bld: 226 mg/dL — ABNORMAL HIGH (ref 65–99)
HCT: 35 % — ABNORMAL LOW (ref 39.0–52.0)
HCT: 25 % — ABNORMAL LOW (ref 39.0–52.0)
HCT: 26 % — ABNORMAL LOW (ref 39.0–52.0)
HCT: 27 % — ABNORMAL LOW (ref 39.0–52.0)
HEMATOCRIT: 39 % (ref 39.0–52.0)
HEMATOCRIT: 26 % — AB (ref 39.0–52.0)
Hemoglobin: 13.3 g/dL (ref 13.0–17.0)
Hemoglobin: 11.9 g/dL — ABNORMAL LOW (ref 13.0–17.0)
Hemoglobin: 8.5 g/dL — ABNORMAL LOW (ref 13.0–17.0)
Hemoglobin: 8.8 g/dL — ABNORMAL LOW (ref 13.0–17.0)
Hemoglobin: 9.2 g/dL — ABNORMAL LOW (ref 13.0–17.0)
Hemoglobin: 8.8 g/dL — ABNORMAL LOW (ref 13.0–17.0)
POTASSIUM: 4.2 mmol/L (ref 3.5–5.1)
POTASSIUM: 3.9 mmol/L (ref 3.5–5.1)
POTASSIUM: 3.7 mmol/L (ref 3.5–5.1)
Potassium: 3.9 mmol/L (ref 3.5–5.1)
Potassium: 3.9 mmol/L (ref 3.5–5.1)
Potassium: 4.9 mmol/L (ref 3.5–5.1)
SODIUM: 140 mmol/L (ref 135–145)
SODIUM: 136 mmol/L (ref 135–145)
Sodium: 139 mmol/L (ref 135–145)
Sodium: 139 mmol/L (ref 135–145)
Sodium: 137 mmol/L (ref 135–145)
Sodium: 138 mmol/L (ref 135–145)
TCO2: 26 mmol/L (ref 0–100)
TCO2: 30 mmol/L (ref 0–100)
TCO2: 27 mmol/L (ref 0–100)
TCO2: 27 mmol/L (ref 0–100)
TCO2: 27 mmol/L (ref 0–100)
TCO2: 26 mmol/L (ref 0–100)

## 2016-01-01 LAB — GLUCOSE, CAPILLARY
GLUCOSE-CAPILLARY: 124 mg/dL — AB (ref 65–99)
GLUCOSE-CAPILLARY: 176 mg/dL — AB (ref 65–99)
Glucose-Capillary: 143 mg/dL — ABNORMAL HIGH (ref 65–99)
Glucose-Capillary: 129 mg/dL — ABNORMAL HIGH (ref 65–99)

## 2016-01-01 LAB — CBC
HCT: 41.7 % (ref 39.0–52.0)
HCT: 29.5 % — ABNORMAL LOW (ref 39.0–52.0)
HEMOGLOBIN: 9.9 g/dL — AB (ref 13.0–17.0)
Hemoglobin: 14.2 g/dL (ref 13.0–17.0)
MCH: 30.6 pg (ref 26.0–34.0)
MCH: 29.9 pg (ref 26.0–34.0)
MCHC: 34.1 g/dL (ref 30.0–36.0)
MCHC: 33.6 g/dL (ref 30.0–36.0)
MCV: 89.9 fL (ref 78.0–100.0)
MCV: 89.1 fL (ref 78.0–100.0)
PLATELETS: 406 10*3/uL — AB (ref 150–400)
PLATELETS: 198 10*3/uL (ref 150–400)
RBC: 4.64 MIL/uL (ref 4.22–5.81)
RBC: 3.31 MIL/uL — AB (ref 4.22–5.81)
RDW: 12.8 % (ref 11.5–15.5)
RDW: 12.5 % (ref 11.5–15.5)
WBC: 8.5 10*3/uL (ref 4.0–10.5)
WBC: 16.6 10*3/uL — AB (ref 4.0–10.5)

## 2016-01-01 LAB — BASIC METABOLIC PANEL
Anion gap: 9 (ref 5–15)
BUN: 11 mg/dL (ref 6–20)
CALCIUM: 9.4 mg/dL (ref 8.9–10.3)
CO2: 28 mmol/L (ref 22–32)
CREATININE: 0.69 mg/dL (ref 0.61–1.24)
Chloride: 99 mmol/L — ABNORMAL LOW (ref 101–111)
GFR calc non Af Amer: >60 mL/min (ref >60)
GLUCOSE: 129 mg/dL — AB (ref 65–99)
Potassium: 3.9 mmol/L (ref 3.5–5.1)
Sodium: 136 mmol/L (ref 135–145)

## 2016-01-01 LAB — POCT I-STAT 3, ART BLOOD GAS (G3+)
Acid-Base Excess: 7 mmol/L — ABNORMAL HIGH (ref 0.0–2.0)
Bicarbonate: 31.2 mmol/L — ABNORMAL HIGH (ref 20.0–28.0)
O2 SAT: 100 %
PCO2 ART: 41.5 mmHg (ref 32.0–48.0)
TCO2: 32 mmol/L (ref 0–100)
pH, Arterial: 7.484 — ABNORMAL HIGH (ref 7.350–7.450)
pO2, Arterial: 536 mmHg — ABNORMAL HIGH (ref 83.0–108.0)

## 2016-01-01 LAB — HEMOGLOBIN AND HEMATOCRIT, BLOOD
HCT: 26.1 % — ABNORMAL LOW (ref 39.0–52.0)
Hemoglobin: 8.9 g/dL — ABNORMAL LOW (ref 13.0–17.0)

## 2016-01-01 LAB — PROTIME-INR
INR: 1.11
INR: 1.84
PROTHROMBIN TIME: 14.4 s (ref 11.4–15.2)
PROTHROMBIN TIME: 21.5 s — AB (ref 11.4–15.2)

## 2016-01-01 LAB — APTT
APTT: 40 s — AB (ref 24–36)
APTT: 35 s (ref 24–36)

## 2016-01-01 LAB — PLATELET COUNT: Platelets: 266 10*3/uL (ref 150–400)

## 2016-01-01 SURGERY — CORONARY ARTERY BYPASS GRAFTING (CABG)
Anesthesia: General | Site: Chest

## 2016-01-01 MED ORDER — MAGNESIUM SULFATE 4 GM/100ML IV SOLN
4.0000 g | Freq: Once | INTRAVENOUS | Status: AC
  Administered 2016-01-01: 4 g via INTRAVENOUS
  Filled 2016-01-01: qty 100

## 2016-01-01 MED ORDER — SODIUM CHLORIDE 0.9% FLUSH
3.0000 mL | INTRAVENOUS | Status: DC | PRN
  Administered 2016-01-02: 09:00:00 via INTRAVENOUS
  Filled 2016-01-01: qty 3

## 2016-01-01 MED ORDER — LACTATED RINGERS IV SOLN
INTRAVENOUS | Status: DC | PRN
  Administered 2016-01-01 (×2): via INTRAVENOUS

## 2016-01-01 MED ORDER — FAMOTIDINE IN NACL 20-0.9 MG/50ML-% IV SOLN
20.0000 mg | Freq: Two times a day (BID) | INTRAVENOUS | Status: AC
  Administered 2016-01-01 – 2016-01-02 (×2): 20 mg via INTRAVENOUS
  Filled 2016-01-01: qty 50

## 2016-01-01 MED ORDER — 0.9 % SODIUM CHLORIDE (POUR BTL) OPTIME
TOPICAL | Status: DC | PRN
  Administered 2016-01-01: 6000 mL

## 2016-01-01 MED ORDER — ROCURONIUM BROMIDE 10 MG/ML (PF) SYRINGE
PREFILLED_SYRINGE | INTRAVENOUS | Status: AC
  Filled 2016-01-01: qty 10

## 2016-01-01 MED ORDER — ASPIRIN 81 MG PO CHEW: 324.0000 mg | CHEWABLE_TABLET | Freq: Every day | ORAL | Status: DC

## 2016-01-01 MED ORDER — SODIUM CHLORIDE 0.9 % IV SOLN
INTRAVENOUS | Status: DC
  Administered 2016-01-01: 23:00:00 via INTRAVENOUS
  Filled 2016-01-01: qty 2.5

## 2016-01-01 MED ORDER — MIDAZOLAM HCL 2 MG/2ML IJ SOLN
INTRAMUSCULAR | Status: AC
  Administered 2016-01-01: 2 mg
  Filled 2016-01-01: qty 2

## 2016-01-01 MED ORDER — HEPARIN SODIUM (PORCINE) 1000 UNIT/ML IJ SOLN
INTRAMUSCULAR | Status: AC
  Filled 2016-01-01: qty 1

## 2016-01-01 MED ORDER — MIDAZOLAM HCL 2 MG/2ML IJ SOLN: 2.0000 mg | INTRAMUSCULAR | Status: DC | PRN

## 2016-01-01 MED ORDER — SODIUM CHLORIDE 0.9 % IJ SOLN
OROMUCOSAL | Status: DC | PRN
  Administered 2016-01-01 (×3): 4 mL via TOPICAL

## 2016-01-01 MED ORDER — HEPARIN SODIUM (PORCINE) 1000 UNIT/ML IJ SOLN
INTRAMUSCULAR | Status: DC | PRN
  Administered 2016-01-01: 2000 [IU] via INTRAVENOUS
  Administered 2016-01-01: 32000 [IU] via INTRAVENOUS

## 2016-01-01 MED ORDER — INSULIN REGULAR BOLUS VIA INFUSION
0.0000 [IU] | Freq: Three times a day (TID) | INTRAVENOUS | Status: DC
  Filled 2016-01-01: qty 10

## 2016-01-01 MED ORDER — HEMOSTATIC AGENTS (NO CHARGE) OPTIME
TOPICAL | Status: DC | PRN
  Administered 2016-01-01: 1 via TOPICAL

## 2016-01-01 MED ORDER — MIDAZOLAM HCL 5 MG/5ML IJ SOLN
INTRAMUSCULAR | Status: DC | PRN
  Administered 2016-01-01: 3 mg via INTRAVENOUS
  Administered 2016-01-01 (×2): 2 mg via INTRAVENOUS
  Administered 2016-01-01: 3 mg via INTRAVENOUS

## 2016-01-01 MED ORDER — ALBUMIN HUMAN 5 % IV SOLN
INTRAVENOUS | Status: DC | PRN
  Administered 2016-01-01: 21:00:00 via INTRAVENOUS

## 2016-01-01 MED ORDER — SODIUM CHLORIDE 0.9 % IV SOLN: 250.0000 mL | INTRAVENOUS | Status: DC

## 2016-01-01 MED ORDER — ROCURONIUM BROMIDE 10 MG/ML (PF) SYRINGE
PREFILLED_SYRINGE | INTRAVENOUS | Status: DC | PRN
  Administered 2016-01-01: 50 mg via INTRAVENOUS
  Administered 2016-01-01 (×2): 100 mg via INTRAVENOUS
  Administered 2016-01-01: 20 mg via INTRAVENOUS

## 2016-01-01 MED ORDER — LACTATED RINGERS IV SOLN
INTRAVENOUS | Status: DC | PRN
  Administered 2016-01-01 (×2): via INTRAVENOUS

## 2016-01-01 MED ORDER — PROTAMINE SULFATE 10 MG/ML IV SOLN
50.0000 mg | Freq: Once | INTRAVENOUS | Status: AC
  Administered 2016-01-01: 15 mg via INTRAVENOUS
  Administered 2016-01-01 (×4): 25 mg via INTRAVENOUS
  Administered 2016-01-01: 10 mg via INTRAVENOUS
  Administered 2016-01-01 (×3): 25 mg via INTRAVENOUS
  Filled 2016-01-01: qty 5

## 2016-01-01 MED ORDER — PHENYLEPHRINE HCL 10 MG/ML IJ SOLN
INTRAMUSCULAR | Status: DC | PRN
  Administered 2016-01-01: 80 ug via INTRAVENOUS

## 2016-01-01 MED ORDER — DOCUSATE SODIUM 100 MG PO CAPS
200.0000 mg | ORAL_CAPSULE | Freq: Every day | ORAL | Status: DC
  Administered 2016-01-02 – 2016-01-05 (×3): 200 mg via ORAL
  Filled 2016-01-01 (×3): qty 2

## 2016-01-01 MED ORDER — ARTIFICIAL TEARS OP OINT
TOPICAL_OINTMENT | OPHTHALMIC | Status: DC | PRN
  Administered 2016-01-01: 1 via OPHTHALMIC

## 2016-01-01 MED ORDER — METOPROLOL TARTRATE 12.5 MG HALF TABLET
12.5000 mg | ORAL_TABLET | Freq: Two times a day (BID) | ORAL | Status: DC
Start: 2016-01-02 — End: 2016-01-05
  Administered 2016-01-03 – 2016-01-05 (×5): 12.5 mg via ORAL
  Filled 2016-01-01 (×6): qty 1

## 2016-01-01 MED ORDER — FENTANYL CITRATE (PF) 100 MCG/2ML IJ SOLN
INTRAMUSCULAR | Status: AC
  Administered 2016-01-01: 100 ug
  Filled 2016-01-01: qty 2

## 2016-01-01 MED ORDER — SODIUM CHLORIDE 0.9 % IV SOLN
INTRAVENOUS | Status: DC
  Administered 2016-01-01: 23:00:00 via INTRAVENOUS

## 2016-01-01 MED ORDER — ACETAMINOPHEN 650 MG RE SUPP
650.0000 mg | Freq: Once | RECTAL | Status: AC
  Administered 2016-01-01: 650 mg via RECTAL

## 2016-01-01 MED ORDER — ORAL CARE MOUTH RINSE
15.0000 mL | OROMUCOSAL | Status: DC
  Administered 2016-01-02 (×3): 15 mL via OROMUCOSAL

## 2016-01-01 MED ORDER — NITROGLYCERIN IN D5W 200-5 MCG/ML-% IV SOLN: 0.0000 ug/min | INTRAVENOUS | Status: AC

## 2016-01-01 MED ORDER — SODIUM CHLORIDE 0.9 % IV SOLN
5.0000 g | INTRAVENOUS | Status: DC
  Filled 2016-01-01: qty 20

## 2016-01-01 MED ORDER — MORPHINE SULFATE (PF) 2 MG/ML IV SOLN
2.0000 mg | INTRAVENOUS | Status: DC | PRN
  Administered 2016-01-02 (×7): 2 mg via INTRAVENOUS
  Filled 2016-01-01 (×7): qty 1

## 2016-01-01 MED ORDER — LACTATED RINGERS IV SOLN
INTRAVENOUS | Status: DC
  Administered 2016-01-01: 22:00:00 via INTRAVENOUS

## 2016-01-01 MED ORDER — MORPHINE SULFATE (PF) 2 MG/ML IV SOLN: 1.0000 mg | INTRAVENOUS | Status: DC | PRN

## 2016-01-01 MED ORDER — SODIUM CHLORIDE 0.45 % IV SOLN
INTRAVENOUS | Status: DC | PRN
  Administered 2016-01-01: 22:00:00 via INTRAVENOUS

## 2016-01-01 MED ORDER — ACETAMINOPHEN 500 MG PO TABS
1000.0000 mg | ORAL_TABLET | Freq: Four times a day (QID) | ORAL | Status: DC
  Administered 2016-01-02 – 2016-01-05 (×13): 1000 mg via ORAL
  Filled 2016-01-01 (×13): qty 2

## 2016-01-01 MED ORDER — SUFENTANIL CITRATE 50 MCG/ML IV SOLN
INTRAVENOUS | Status: AC
  Filled 2016-01-01: qty 1

## 2016-01-01 MED ORDER — SODIUM CHLORIDE 0.9 % IJ SOLN
INTRAMUSCULAR | Status: AC
  Filled 2016-01-01: qty 10

## 2016-01-01 MED ORDER — ALBUMIN HUMAN 5 % IV SOLN
250.0000 mL | INTRAVENOUS | Status: AC | PRN
  Administered 2016-01-02 (×4): 250 mL via INTRAVENOUS
  Filled 2016-01-01 (×2): qty 250

## 2016-01-01 MED ORDER — LACTATED RINGERS IV SOLN
INTRAVENOUS | Status: DC | PRN
  Administered 2016-01-01: 14:00:00 via INTRAVENOUS

## 2016-01-01 MED ORDER — METOPROLOL TARTRATE 5 MG/5ML IV SOLN: 2.5000 mg | INTRAVENOUS | Status: DC | PRN

## 2016-01-01 MED ORDER — ASPIRIN EC 325 MG PO TBEC
325.0000 mg | DELAYED_RELEASE_TABLET | Freq: Every day | ORAL | Status: DC
  Administered 2016-01-02 – 2016-01-05 (×4): 325 mg via ORAL
  Filled 2016-01-01 (×4): qty 1

## 2016-01-01 MED ORDER — MIDAZOLAM HCL 2 MG/2ML IJ SOLN: 2.0000 mg | Freq: Once | INTRAMUSCULAR | Status: DC

## 2016-01-01 MED ORDER — CHLORHEXIDINE GLUCONATE 0.12% ORAL RINSE (MEDLINE KIT)
15.0000 mL | Freq: Two times a day (BID) | OROMUCOSAL | Status: DC
  Administered 2016-01-02 – 2016-01-03 (×3): 15 mL via OROMUCOSAL

## 2016-01-01 MED ORDER — FENTANYL CITRATE (PF) 250 MCG/5ML IJ SOLN
INTRAMUSCULAR | Status: DC | PRN
  Administered 2016-01-01: 100 ug via INTRAVENOUS

## 2016-01-01 MED ORDER — LACTATED RINGERS IV SOLN
INTRAVENOUS | Status: DC
  Administered 2016-01-01: 14:00:00 via INTRAVENOUS

## 2016-01-01 MED ORDER — ACETAMINOPHEN 160 MG/5ML PO SOLN: 1000.0000 mg | Freq: Four times a day (QID) | ORAL | Status: DC

## 2016-01-01 MED ORDER — PHENYLEPHRINE HCL 10 MG/ML IJ SOLN
0.0000 ug/min | INTRAVENOUS | Status: DC
  Filled 2016-01-01 (×2): qty 2

## 2016-01-01 MED ORDER — SUFENTANIL CITRATE 50 MCG/ML IV SOLN
INTRAVENOUS | Status: DC | PRN
  Administered 2016-01-01 (×3): 20 ug via INTRAVENOUS
  Administered 2016-01-01: 10 ug via INTRAVENOUS
  Administered 2016-01-01: 30 ug via INTRAVENOUS
  Administered 2016-01-01: 10 ug via INTRAVENOUS
  Administered 2016-01-01: 100 ug via INTRAVENOUS
  Administered 2016-01-01: 30 ug via INTRAVENOUS
  Administered 2016-01-01: 20 ug via INTRAVENOUS

## 2016-01-01 MED ORDER — ACETAMINOPHEN 160 MG/5ML PO SOLN
650.0000 mg | Freq: Once | ORAL | Status: AC
Start: 2016-01-01 — End: 2016-01-01

## 2016-01-01 MED ORDER — MIDAZOLAM HCL 10 MG/2ML IJ SOLN
INTRAMUSCULAR | Status: AC
  Filled 2016-01-01: qty 2

## 2016-01-01 MED ORDER — LIDOCAINE 2% (20 MG/ML) 5 ML SYRINGE
INTRAMUSCULAR | Status: DC | PRN
  Administered 2016-01-01: 100 mg via INTRAVENOUS

## 2016-01-01 MED ORDER — FENTANYL CITRATE (PF) 100 MCG/2ML IJ SOLN: 100.0000 ug | Freq: Once | INTRAMUSCULAR | Status: DC

## 2016-01-01 MED ORDER — PROTAMINE SULFATE 10 MG/ML IV SOLN
INTRAVENOUS | Status: AC
  Filled 2016-01-01: qty 5

## 2016-01-01 MED ORDER — BISACODYL 5 MG PO TBEC
10.0000 mg | DELAYED_RELEASE_TABLET | Freq: Every day | ORAL | Status: DC
  Administered 2016-01-02 – 2016-01-05 (×3): 10 mg via ORAL
  Filled 2016-01-01 (×3): qty 2

## 2016-01-01 MED ORDER — BISACODYL 10 MG RE SUPP: 10.0000 mg | Freq: Every day | RECTAL | Status: DC

## 2016-01-01 MED ORDER — PLASMA-LYTE 148 IV SOLN
INTRAVENOUS | Status: DC
  Filled 2016-01-01: qty 2.5

## 2016-01-01 MED ORDER — VANCOMYCIN HCL IN DEXTROSE 1-5 GM/200ML-% IV SOLN
1000.0000 mg | Freq: Once | INTRAVENOUS | Status: AC
  Administered 2016-01-02: 1000 mg via INTRAVENOUS
  Filled 2016-01-01: qty 200

## 2016-01-01 MED ORDER — FENTANYL CITRATE (PF) 250 MCG/5ML IJ SOLN
INTRAMUSCULAR | Status: AC
  Filled 2016-01-01: qty 5

## 2016-01-01 MED ORDER — PANTOPRAZOLE SODIUM 40 MG PO TBEC
40.0000 mg | DELAYED_RELEASE_TABLET | Freq: Every day | ORAL | Status: DC
  Administered 2016-01-03 – 2016-01-05 (×3): 40 mg via ORAL
  Filled 2016-01-01 (×3): qty 1

## 2016-01-01 MED ORDER — METOPROLOL TARTRATE 25 MG/10 ML ORAL SUSPENSION: 12.5000 mg | Freq: Two times a day (BID) | ORAL | Status: DC

## 2016-01-01 MED ORDER — TRAMADOL HCL 50 MG PO TABS: 50.0000 mg | ORAL_TABLET | ORAL | Status: DC | PRN

## 2016-01-01 MED ORDER — OXYCODONE HCL 5 MG PO TABS
5.0000 mg | ORAL_TABLET | ORAL | Status: DC | PRN
  Administered 2016-01-03 – 2016-01-04 (×4): 10 mg via ORAL
  Filled 2016-01-01 (×4): qty 2

## 2016-01-01 MED ORDER — PHENYLEPHRINE HCL 10 MG/ML IJ SOLN
INTRAVENOUS | Status: DC | PRN
  Administered 2016-01-01: 20 ug/min via INTRAVENOUS

## 2016-01-01 MED ORDER — PROPOFOL 10 MG/ML IV BOLUS
INTRAVENOUS | Status: DC | PRN
  Administered 2016-01-01: 70 mg via INTRAVENOUS

## 2016-01-01 MED ORDER — POTASSIUM CHLORIDE 10 MEQ/50ML IV SOLN
10.0000 meq | INTRAVENOUS | Status: AC
  Administered 2016-01-01 – 2016-01-02 (×4): 10 meq via INTRAVENOUS

## 2016-01-01 MED ORDER — LACTATED RINGERS IV SOLN: 500.0000 mL | Freq: Once | INTRAVENOUS | Status: DC | PRN

## 2016-01-01 MED ORDER — SUFENTANIL CITRATE 250 MCG/5ML IV SOLN
INTRAVENOUS | Status: AC
  Filled 2016-01-01: qty 5

## 2016-01-01 MED ORDER — DEXTROSE 5 % IV SOLN
1.5000 g | Freq: Two times a day (BID) | INTRAVENOUS | Status: DC
  Administered 2016-01-02 – 2016-01-03 (×3): 1.5 g via INTRAVENOUS
  Filled 2016-01-01 (×4): qty 1.5

## 2016-01-01 MED ORDER — PLASMA-LYTE 148 IV SOLN
INTRAVENOUS | Status: DC | PRN
  Administered 2016-01-01 (×2): 500 mL via INTRAVASCULAR

## 2016-01-01 MED ORDER — PROPOFOL 10 MG/ML IV BOLUS
INTRAVENOUS | Status: AC
  Filled 2016-01-01: qty 20

## 2016-01-01 MED ORDER — DEXMEDETOMIDINE HCL IN NACL 200 MCG/50ML IV SOLN: 0.0000 ug/kg/h | INTRAVENOUS | Status: DC

## 2016-01-01 MED ORDER — PNEUMOCOCCAL VAC POLYVALENT 25 MCG/0.5ML IJ INJ: 0.5000 mL | INJECTION | INTRAMUSCULAR | Status: DC

## 2016-01-01 MED ORDER — SODIUM CHLORIDE 0.9% FLUSH
3.0000 mL | Freq: Two times a day (BID) | INTRAVENOUS | Status: DC
  Administered 2016-01-02 – 2016-01-03 (×2): 3 mL via INTRAVENOUS

## 2016-01-01 MED ORDER — ONDANSETRON HCL 4 MG/2ML IJ SOLN
4.0000 mg | Freq: Four times a day (QID) | INTRAMUSCULAR | Status: DC | PRN
  Administered 2016-01-02: 4 mg via INTRAVENOUS
  Filled 2016-01-01: qty 2

## 2016-01-01 MED ORDER — CHLORHEXIDINE GLUCONATE 0.12 % MT SOLN
15.0000 mL | OROMUCOSAL | Status: AC
  Administered 2016-01-01: 15 mL via OROMUCOSAL

## 2016-01-01 MED FILL — Magnesium Sulfate Inj 50%: INTRAMUSCULAR | Qty: 10 | Status: AC

## 2016-01-01 MED FILL — Heparin Sodium (Porcine) Inj 1000 Unit/ML: INTRAMUSCULAR | Qty: 30 | Status: AC

## 2016-01-01 MED FILL — Potassium Chloride Inj 2 mEq/ML: INTRAVENOUS | Qty: 40 | Status: AC

## 2016-01-01 SURGICAL SUPPLY — 98 items
APPLIER CLIP 9.375 SM OPEN (CLIP)
BAG DECANTER FOR FLEXI CONT (MISCELLANEOUS) ×10 IMPLANT
BANDAGE ACE 4X5 VEL STRL LF (GAUZE/BANDAGES/DRESSINGS) IMPLANT
BANDAGE ACE 6X5 VEL STRL LF (GAUZE/BANDAGES/DRESSINGS) IMPLANT
BANDAGE ELASTIC 4 VELCRO ST LF (GAUZE/BANDAGES/DRESSINGS) ×15 IMPLANT
BANDAGE ELASTIC 6 VELCRO ST LF (GAUZE/BANDAGES/DRESSINGS) ×10 IMPLANT
BASKET HEART  (ORDER IN 25'S) (MISCELLANEOUS) ×1
BASKET HEART (ORDER IN 25'S) (MISCELLANEOUS) ×1
BASKET HEART (ORDER IN 25S) (MISCELLANEOUS) ×3 IMPLANT
BLADE STERNUM SYSTEM 6 (BLADE) ×5 IMPLANT
BLADE SURG 15 STRL LF DISP TIS (BLADE) ×3 IMPLANT
BLADE SURG 15 STRL SS (BLADE) ×2
BNDG GAUZE ELAST 4 BULKY (GAUZE/BANDAGES/DRESSINGS) ×15 IMPLANT
CANISTER SUCTION 2500CC (MISCELLANEOUS) ×5 IMPLANT
CANNULA EZ GLIDE AORTIC 21FR (CANNULA) ×5 IMPLANT
CATH CPB KIT HENDRICKSON (MISCELLANEOUS) ×5 IMPLANT
CATH ROBINSON RED A/P 18FR (CATHETERS) ×15 IMPLANT
CATH THORACIC 36FR (CATHETERS) ×5 IMPLANT
CATH THORACIC 36FR RT ANG (CATHETERS) ×5 IMPLANT
CLIP APPLIE 9.375 SM OPEN (CLIP) IMPLANT
CLIP FOGARTY SPRING 6M (CLIP) ×10 IMPLANT
CLIP TI MEDIUM 24 (CLIP) IMPLANT
CLIP TI WIDE RED SMALL 24 (CLIP) ×10 IMPLANT
COVER MAYO STAND STRL (DRAPES) IMPLANT
CRADLE DONUT ADULT HEAD (MISCELLANEOUS) ×5 IMPLANT
DRAPE CARDIOVASCULAR INCISE (DRAPES) ×2
DRAPE EXTREMITY T 121X128X90 (DRAPE) ×5 IMPLANT
DRAPE PROXIMA HALF (DRAPES) IMPLANT
DRAPE SLUSH/WARMER DISC (DRAPES) ×5 IMPLANT
DRAPE SRG 135X102X78XABS (DRAPES) ×3 IMPLANT
DRSG COVADERM 4X14 (GAUZE/BANDAGES/DRESSINGS) ×5 IMPLANT
ELECT BLADE 4.0 EZ CLEAN MEGAD (MISCELLANEOUS) ×5
ELECT REM PT RETURN 9FT ADLT (ELECTROSURGICAL) ×10
ELECTRODE BLDE 4.0 EZ CLN MEGD (MISCELLANEOUS) ×3 IMPLANT
ELECTRODE REM PT RTRN 9FT ADLT (ELECTROSURGICAL) ×6 IMPLANT
FELT TEFLON 1X6 (MISCELLANEOUS) ×10 IMPLANT
GAUZE SPONGE 4X4 12PLY STRL (GAUZE/BANDAGES/DRESSINGS) ×10 IMPLANT
GEL ULTRASOUND 20GR AQUASONIC (MISCELLANEOUS) ×5 IMPLANT
GLOVE SURG SIGNA 7.5 PF LTX (GLOVE) ×15 IMPLANT
GOWN STRL REUS W/ TWL LRG LVL3 (GOWN DISPOSABLE) ×15 IMPLANT
GOWN STRL REUS W/ TWL XL LVL3 (GOWN DISPOSABLE) ×6 IMPLANT
GOWN STRL REUS W/TWL LRG LVL3 (GOWN DISPOSABLE) ×10
GOWN STRL REUS W/TWL XL LVL3 (GOWN DISPOSABLE) ×4
HARMONIC SHEARS 14CM COAG (MISCELLANEOUS) ×5 IMPLANT
HEMOSTAT POWDER SURGIFOAM 1G (HEMOSTASIS) ×15 IMPLANT
HEMOSTAT SURGICEL 2X14 (HEMOSTASIS) ×5 IMPLANT
INSERT FOGARTY XLG (MISCELLANEOUS) IMPLANT
KIT BASIN OR (CUSTOM PROCEDURE TRAY) ×5 IMPLANT
KIT ROOM TURNOVER OR (KITS) ×5 IMPLANT
KIT SUCTION CATH 14FR (SUCTIONS) ×10 IMPLANT
KIT VASOVIEW HEMOPRO VH 3000 (KITS) ×5 IMPLANT
MARKER GRAFT CORONARY BYPASS (MISCELLANEOUS) ×20 IMPLANT
NS IRRIG 1000ML POUR BTL (IV SOLUTION) ×30 IMPLANT
PACK OPEN HEART (CUSTOM PROCEDURE TRAY) ×5 IMPLANT
PAD ARMBOARD 7.5X6 YLW CONV (MISCELLANEOUS) ×10 IMPLANT
PAD ELECT DEFIB RADIOL ZOLL (MISCELLANEOUS) ×5 IMPLANT
PENCIL BUTTON HOLSTER BLD 10FT (ELECTRODE) ×5 IMPLANT
PUNCH AORTIC ROT 4.0MM RCL 40 (MISCELLANEOUS) ×5 IMPLANT
PUNCH AORTIC ROTATE 4.0MM (MISCELLANEOUS) IMPLANT
PUNCH AORTIC ROTATE 4.5MM 8IN (MISCELLANEOUS) IMPLANT
PUNCH AORTIC ROTATE 5MM 8IN (MISCELLANEOUS) IMPLANT
SET CARDIOPLEGIA MPS 5001102 (MISCELLANEOUS) ×5 IMPLANT
SPONGE GAUZE 4X4 12PLY STER LF (GAUZE/BANDAGES/DRESSINGS) ×20 IMPLANT
SPONGE LAP 18X18 X RAY DECT (DISPOSABLE) ×10 IMPLANT
SPONGE LAP 4X18 X RAY DECT (DISPOSABLE) ×5 IMPLANT
SUT BONE WAX W31G (SUTURE) ×5 IMPLANT
SUT MNCRL AB 4-0 PS2 18 (SUTURE) IMPLANT
SUT PROLENE 3 0 SH DA (SUTURE) ×5 IMPLANT
SUT PROLENE 4 0 RB 1 (SUTURE)
SUT PROLENE 4 0 SH DA (SUTURE) IMPLANT
SUT PROLENE 4-0 RB1 .5 CRCL 36 (SUTURE) IMPLANT
SUT PROLENE 5 0 C 1 36 (SUTURE) ×5 IMPLANT
SUT PROLENE 6 0 C 1 30 (SUTURE) ×10 IMPLANT
SUT PROLENE 7 0 BV1 MDA (SUTURE) ×15 IMPLANT
SUT PROLENE 8 0 BV175 6 (SUTURE) ×30 IMPLANT
SUT STEEL 6MS V (SUTURE) ×5 IMPLANT
SUT STEEL STERNAL CCS#1 18IN (SUTURE) IMPLANT
SUT STEEL SZ 6 DBL 3X14 BALL (SUTURE) ×5 IMPLANT
SUT VIC AB 1 CTX 36 (SUTURE) ×4
SUT VIC AB 1 CTX36XBRD ANBCTR (SUTURE) ×6 IMPLANT
SUT VIC AB 2-0 CT1 27 (SUTURE) ×10
SUT VIC AB 2-0 CT1 TAPERPNT 27 (SUTURE) ×15 IMPLANT
SUT VIC AB 2-0 CTX 27 (SUTURE) IMPLANT
SUT VIC AB 3-0 SH 27 (SUTURE)
SUT VIC AB 3-0 SH 27X BRD (SUTURE) IMPLANT
SUT VIC AB 3-0 X1 27 (SUTURE) ×15 IMPLANT
SUT VICRYL 4-0 PS2 18IN ABS (SUTURE) IMPLANT
SUTURE E-PAK OPEN HEART (SUTURE) ×5 IMPLANT
SYR 50ML SLIP (SYRINGE) IMPLANT
SYSTEM SAHARA CHEST DRAIN ATS (WOUND CARE) ×5 IMPLANT
TAPE CLOTH SURG 4X10 WHT LF (GAUZE/BANDAGES/DRESSINGS) ×5 IMPLANT
TOWEL OR 17X24 6PK STRL BLUE (TOWEL DISPOSABLE) ×10 IMPLANT
TOWEL OR 17X26 10 PK STRL BLUE (TOWEL DISPOSABLE) ×10 IMPLANT
TRAY FOLEY IC TEMP SENS 16FR (CATHETERS) ×5 IMPLANT
TUBE FEEDING 8FR 16IN STR KANG (MISCELLANEOUS) ×5 IMPLANT
TUBING INSUFFLATION (TUBING) ×5 IMPLANT
UNDERPAD 30X30 (UNDERPADS AND DIAPERS) ×10 IMPLANT
WATER STERILE IRR 1000ML POUR (IV SOLUTION) ×10 IMPLANT

## 2016-01-01 NOTE — Anesthesia Procedure Notes (Signed)
Central Venous Catheter Insertion Performed by: anesthesiologist Patient location: Pre-op. Preanesthetic checklist: patient identified, IV checked, site marked, risks and benefits discussed, surgical consent, monitors and equipment checked, pre-op evaluation, timeout performed and anesthesia consent Landmarks identified PA cath was placed.Swan type and PA catheter depth:thermodilation and 48PA Cath depth:48 Procedure performed using ultrasound guided technique. Attempts: 1 Patient tolerated the procedure well with no immediate complications.

## 2016-01-01 NOTE — Progress Notes (Signed)
PROGRESS NOTE    Kurt Mathis  [id number] DOB: [birth year] DOA: 12/23/2015 PCP: Sanda Klein, MD    Brief Narrative:  55 y/o presengint with Non Stemi and newly diagnosed DM. TRH consulted for diabetes management.  Assessment & Plan:   NSTEMI (non-ST elevated myocardial infarction) [facility]) -Cardiology on board and recommending catheterization/coronary angiography and resvascluarization as indicated. -plan is for CABG on 01/01/16 -after 72 hours heparin drip discontinued. No CP -continue B-blocker and low dose lisinopril -plan is for plavix for 1 year after CABG  Ischemic cardiomyopathy: with EF 45-50% -per cardiology started on ACE and B-blocker low dose -discussed low sodium diet and daily weights monitoring   Diabetes mellitus type 2, uncontrolled, with complications ([place]) -Once ready for discharge will use levemir (as patient w/o insurance) -might be able to use amaryl BID along with long acting insulin (levemir is cheaper than Lantus; but will still need assistance with orange card; dose is the same regarding levemir and lantus) -discussed needs for modified carb diet and not skipping meals -long acting insulin adjusted to 16 units daily -continue SSI  Essential hypertension -Currently normotensive on metoprolol and low dose lisinopril  -advise to follow low sodium diet   HLD -continue statins   DVT prophylaxis: On heparin Code Status: Full Family Communication: None at bedside Disposition Plan: plan is for CABG later today; at discharge will need orange card and assistance to get medications. Will recommend levemir anda amaryl BID as part of regimen for his diabetes. Needs to establish care with PCP and has close follow up for further adjustments on his hypoglycemic regimen. While inpatient continue SSI and current dose of long acting. Please call with any questions.   Consultants:   Cardiology   Procedures:   CABG planned for  01/01/16   Antimicrobials: None   Subjective: Afebrile, in no distress. Patient is AAOX3. Denies CP and SOB.  Objective: Vitals:   12/31/15 1338 12/31/15 1924 12/31/15 2152 01/01/16 0431  BP: (!) 117/54 (!) 119/56 110/60 124/68  Pulse: 73 86 80 74  Resp: 16 20  20   Temp: 98.1 F (36.7 C) 98 F (36.7 C)  98.4 F (36.9 C)  TempSrc: Oral Oral  Oral  SpO2: 98% 95%  100%  Weight:    71.9 kg (158 lb 8 oz)  Height:        Intake/Output Summary (Last 24 hours) at 01/01/16 0800 Last data filed at 12/31/15 1704  Gross per 24 hour  Intake              840 ml  Output                0 ml  Net              840 ml   Filed Weights   12/30/15 0444 12/31/15 0422 01/01/16 0431  Weight: 72 kg (158 lb 11.2 oz) 71.8 kg (158 lb 6.4 oz) 71.9 kg (158 lb 8 oz)    Examination: General exam: Appears calm and comfortable, in no distress. Patient with some anxiety given hospitalization, diagnosis and surgery today. Denies SOB/orthopnea and CP.  Respiratory system: Clear to auscultation. Respiratory effort normal. Cardiovascular system: S1 & S2 heard, RRR. No rubs, no murmurs Gastrointestinal system: Abdomen is nondistended, soft and nontender. No organomegaly or masses felt. Normal bowel sounds heard. Central nervous system: Alert and oriented. No focal neurological deficits. Extremities: Symmetric 5 x 5 power. Skin: No rashes, lesions or ulcers Psychiatry: Judgement and insight appear normal. Mood &  affect appropriate.   Data Reviewed: I have personally reviewed following labs and imaging studies  CBC:  Recent Labs Lab 12/25/15 1513 12/26/15 1635 12/28/15 0216 12/31/15 1031 01/01/16 0626  WBC 6.5 7.5 8.0 7.6 8.5  HGB 13.8 15.1 13.6 13.8 14.2  HCT 41.1 44.3 41.2 41.2 41.7  MCV 89.5 89.7 90.9 90.5 89.9  PLT 314 357 351 379 [id number]*   Basic Metabolic Panel:  Recent Labs Lab 12/25/15 1513 12/26/15 1635 12/31/15 1031 01/01/16 0626  NA  --  138 133* 136  K  --  4.1 4.0 3.9  CL  --   103 99* 99*  CO2  --  28 25 28   GLUCOSE  --  148* 231* 129*  BUN  --  9 13 11   CREATININE 0.61 0.74 0.69 0.69  CALCIUM  --  9.5 9.2 9.4   GFR: Estimated Creatinine Clearance: 106.1 mL/min (by C-G formula based on SCr of 0.69 mg/dL).   CBG:  Recent Labs Lab 12/31/15 0613 12/31/15 1105 12/31/15 1625 12/31/15 2127 01/01/16 0603  GLUCAP 118* 207* 163* 151* 124*    Recent Results (from the past 240 hour(s))  MRSA PCR Screening     Status: None   Collection Time: 12/23/15  3:30 PM  Result Value Ref Range Status   MRSA by PCR NEGATIVE NEGATIVE Final    Comment:        The GeneXpert MRSA Assay (FDA approved for NASAL specimens only), is one component of a comprehensive MRSA colonization surveillance program. It is not intended to diagnose MRSA infection nor to guide or monitor treatment for MRSA infections.      Radiology Studies: No results found.  Scheduled Meds: . aminocaproic acid (AMICAR) for OHS   Intravenous To OR  . aspirin EC  81 mg Oral Daily  . atorvastatin  80 mg Oral q1800  . cefUROXime (ZINACEF)  IV  1.5 g Intravenous To OR  . cefUROXime (ZINACEF)  IV  750 mg Intravenous To OR  . chlorhexidine  15 mL Mouth/Throat Once  . dexmedetomidine  0.1-0.7 mcg/kg/hr Intravenous To OR  . diazepam  5 mg Oral Once  . DOPamine  0-10 mcg/kg/min Intravenous To OR  . epinephrine  0-10 mcg/min Intravenous To OR  . heparin-papaverine-plasmalyte irrigation   Irrigation To OR  . heparin 30,000 units/NS 1000 mL solution for CELLSAVER   Other To OR  . insulin aspart  0-15 Units Subcutaneous TID WC  . insulin glargine  16 Units Subcutaneous QHS  . insulin (NOVOLIN-R) infusion   Intravenous To OR  . lisinopril  2.5 mg Oral Daily  . magnesium sulfate  40 mEq Other To OR  . metoprolol tartrate  12.5 mg Oral Once  . metoprolol tartrate  25 mg Oral BID  . nitroGLYCERIN  2-200 mcg/min Intravenous To OR  . phenylephrine (NEO-SYNEPHRINE) Adult infusion  30-200 mcg/min Intravenous  To OR  . potassium chloride  80 mEq Other To OR  . sodium chloride flush  3 mL Intravenous Q12H  . vancomycin  1,250 mg Intravenous To OR   Continuous Infusions:     LOS: 9 days    Time spent: > 20 minutes    Barton Dubois, MD [facility] Pager [phone]  If 7PM-7AM, please contact night-coverage [email or web address] Password [email or web address] 01/01/2016, 8:00 AM

## 2016-01-01 NOTE — Progress Notes (Signed)
  Echocardiogram Echocardiogram Transesophageal has been performed.  Kurt Mathis 01/01/2016, 3:33 PM

## 2016-01-01 NOTE — Anesthesia Procedure Notes (Signed)
Procedure Name: Intubation Date/Time: 01/01/2016 2:51 PM Performed by: Salli Quarry Crayton Savarese Pre-anesthesia Checklist: Patient identified, Emergency Drugs available, Suction available and Patient being monitored Patient Re-evaluated:Patient Re-evaluated prior to inductionOxygen Delivery Method: Circle System Utilized Preoxygenation: Pre-oxygenation with 100% oxygen Intubation Type: IV induction Ventilation: Mask ventilation without difficulty Laryngoscope Size: Mac and 4 Grade View: Grade I Tube type: Oral Tube size: 8.0 mm Number of attempts: 1 Airway Equipment and Method: Stylet and Oral airway Placement Confirmation: ETT inserted through vocal cords under direct vision,  positive ETCO2 and breath sounds checked- equal and bilateral Secured at: 24 cm Tube secured with: Tape Dental Injury: Teeth and Oropharynx as per pre-operative assessment

## 2016-01-01 NOTE — OR Nursing (Signed)
Forty-five minute call to SICU charge nurse at 2113.

## 2016-01-01 NOTE — OR Nursing (Signed)
Twenty minute call to SICU charge nurse at 2139. Spoke to [employer].

## 2016-01-01 NOTE — Anesthesia Procedure Notes (Signed)
Central Venous Catheter Insertion Performed by: anesthesiologist Patient location: Pre-op. Preanesthetic checklist: patient identified, IV checked, site marked, risks and benefits discussed, surgical consent, monitors and equipment checked, pre-op evaluation, timeout performed and anesthesia consent Position: Trendelenburg Lidocaine 1% used for infiltration Landmarks identified Catheter size: 9 Fr Sheath introducer Procedure performed using ultrasound guided technique. Attempts: 1 Following insertion, line sutured and dressing applied. Post procedure assessment: blood return through all ports, free fluid flow and no air. Patient tolerated the procedure well with no immediate complications.

## 2016-01-01 NOTE — Progress Notes (Signed)
Patient Name: Kurt Mathis Date of Encounter: 01/01/2016  Hospital Problem List     Principal Problem:   NSTEMI (non-ST elevated myocardial infarction) [facility]) Active Problems:   Diabetes mellitus type 2, uncontrolled, with complications ([place])   Essential hypertension   HLD (hyperlipidemia)   Coronary artery disease involving native coronary artery of native heart without angina pectoris   Cardiomyopathy, ischemic    Patient Profile     No chest pain.  No SOB  Subjective   No distress  Inpatient Medications    . aminocaproic acid (AMICAR) for OHS   Intravenous To OR  . aspirin EC  81 mg Oral Daily  . atorvastatin  80 mg Oral q1800  . cefUROXime (ZINACEF)  IV  1.5 g Intravenous To OR  . cefUROXime (ZINACEF)  IV  750 mg Intravenous To OR  . chlorhexidine  15 mL Mouth/Throat Once  . dexmedetomidine  0.1-0.7 mcg/kg/hr Intravenous To OR  . diazepam  5 mg Oral Once  . DOPamine  0-10 mcg/kg/min Intravenous To OR  . epinephrine  0-10 mcg/min Intravenous To OR  . heparin-papaverine-plasmalyte irrigation   Irrigation To OR  . heparin 30,000 units/NS 1000 mL solution for CELLSAVER   Other To OR  . insulin aspart  0-15 Units Subcutaneous TID WC  . insulin glargine  16 Units Subcutaneous QHS  . insulin (NOVOLIN-R) infusion   Intravenous To OR  . lisinopril  2.5 mg Oral Daily  . magnesium sulfate  40 mEq Other To OR  . metoprolol tartrate  12.5 mg Oral Once  . metoprolol tartrate  25 mg Oral BID  . nitroGLYCERIN  2-200 mcg/min Intravenous To OR  . phenylephrine (NEO-SYNEPHRINE) Adult infusion  30-200 mcg/min Intravenous To OR  . [START ON 01/02/2016] pneumococcal 23 valent vaccine  0.5 mL Intramuscular Tomorrow-1000  . potassium chloride  80 mEq Other To OR  . sodium chloride flush  3 mL Intravenous Q12H  . vancomycin  1,250 mg Intravenous To OR    Vital Signs    Vitals:   12/31/15 1338 12/31/15 1924 12/31/15 2152 01/01/16 0431  BP: (!) 117/54 (!) 119/56 110/60  124/68  Pulse: 73 86 80 74  Resp: 16 20  20   Temp: 98.1 F (36.7 C) 98 F (36.7 C)  98.4 F (36.9 C)  TempSrc: Oral Oral  Oral  SpO2: 98% 95%  100%  Weight:    158 lb 8 oz (71.9 kg)  Height:        Intake/Output Summary (Last 24 hours) at 01/01/16 1002 Last data filed at 12/31/15 1704  Gross per 24 hour  Intake              480 ml  Output                0 ml  Net              480 ml   Filed Weights   12/30/15 0444 12/31/15 0422 01/01/16 0431  Weight: 158 lb 11.2 oz (72 kg) 158 lb 6.4 oz (71.8 kg) 158 lb 8 oz (71.9 kg)    Physical Exam    GEN: Well nourished, well developed, in no acute distress.  Neck: Supple, no JVD, carotid bruits, or masses. Cardiac: RRR, no rubs, or gallops. No clubbing, cyanosis, no edema.  Radials/DP/PT 2+ and equal bilaterally.  Respiratory:  Respirations  regular and unlabored, clear to auscultation bilaterally. GI: Soft, nontender, nondistended, BS + x 4. Neuro:  Strength and sensation  are intact.   Labs    CBC  Recent Labs  12/31/15 1031 01/01/16 0626  WBC 7.6 8.5  HGB 13.8 14.2  HCT 41.2 41.7  MCV 90.5 89.9  PLT 379 [id number]*   Basic Metabolic Panel  Recent Labs  12/31/15 1031 01/01/16 0626  NA 133* 136  K 4.0 3.9  CL 99* 99*  CO2 25 28  GLUCOSE 231* 129*  BUN 13 11  CREATININE 0.69 0.69  CALCIUM 9.2 9.4   Liver Function Tests No results for input(s): AST, ALT, ALKPHOS, BILITOT, PROT, ALBUMIN in the last 72 hours. No results for input(s): LIPASE, AMYLASE in the last 72 hours. Cardiac Enzymes No results for input(s): CKTOTAL, CKMB, CKMBINDEX, TROPONINI in the last 72 hours. BNP Invalid input(s): POCBNP D-Dimer No results for input(s): DDIMER in the last 72 hours. Hemoglobin A1C No results for input(s): HGBA1C in the last 72 hours. Fasting Lipid Panel No results for input(s): CHOL, HDL, LDLCALC, TRIG, CHOLHDL, LDLDIRECT in the last 72 hours. Thyroid Function Tests No results for input(s): TSH, T4TOTAL, T3FREE,  THYROIDAB in the last 72 hours.  Invalid input(s): FREET3  Telemetry    NSR  ECG    NA  Radiology    Dg Chest 2 View  Result Date: 12/23/2015 CLINICAL DATA:  Tachycardia and chest pain for 3 days. EXAM: CHEST  2 VIEW COMPARISON:  None. FINDINGS: The cardiomediastinal silhouette is unremarkable. There is no evidence of focal airspace disease, pulmonary edema, suspicious pulmonary nodule/mass, pleural effusion, or pneumothorax. No acute bony abnormalities are identified. IMPRESSION: No active cardiopulmonary disease. Electronically Signed   By: Margarette Canada M.D.   On: 12/23/2015 11:50    Assessment & Plan    CAD:  CABG today.   NSTEMI:  Plan is for Plavix 12 months after the cath.    ISCHEMIC CARDIOMYOPATHY:    Started on ACE.  Continue other therapies.    DM:   Probably long standing and poorly controlled.  Continue current therapy.   Signed, Minus Breeding, MD  01/01/2016, 10:02 AM

## 2016-01-01 NOTE — Interval H&P Note (Signed)
History and Physical Interval Note:  01/01/2016 1:41 PM  Kurt Mathis  has presented today for surgery, with the diagnosis of CAD  The various methods of treatment have been discussed with the patient and family. After consideration of risks, benefits and other options for treatment, the patient has consented to  Procedure(s): CORONARY ARTERY BYPASS GRAFTING (CABG) (N/A) TRANSESOPHAGEAL ECHOCARDIOGRAM (TEE) (N/A) RADIAL ARTERY HARVEST (Left) as a surgical intervention .  The patient's history has been reviewed, patient examined, no change in status, stable for surgery.  I have reviewed the patient's chart and labs.  Questions were answered to the patient's satisfaction.     [name]

## 2016-01-01 NOTE — Transfer of Care (Signed)
Immediate Anesthesia Transfer of Care Note  Patient: Kurt Mathis  Procedure(s) Performed: Procedure(s) with comments: CORONARY ARTERY BYPASS GRAFTING (CABG), ON PUMP, TIMES 5, USING LEFT RADIAL ARTERY AND LEFT INTERNAL MAMMARY ARTERY, BILATERAL GREATER SAPHENOUS VEINS HARVESTED ENDOSCOPICALLY (N/A) - LIMA-LAD; SEQ SVG-RCA-PD; SVG-DIAG; LEFT RADIAL-OM TRANSESOPHAGEAL ECHOCARDIOGRAM (TEE) (N/A) LEFT RADIAL ARTERY HARVEST (Left)  Patient Location: SICU  Anesthesia Type:General  Level of Consciousness: Patient remains intubated per anesthesia plan  Airway & Oxygen Therapy: Patient remains intubated per anesthesia plan and Patient placed on Ventilator (see vital sign flow sheet for setting)  Post-op Assessment: Report given to RN and Post -op Vital signs reviewed and stable  Post vital signs: Reviewed and stable  Last Vitals:  Vitals:   01/01/16 1249 01/01/16 1250  BP: 124/70 124/70  Pulse: 84 84  Resp:    Temp:      Last Pain:  Vitals:   01/01/16 1128  TempSrc:   PainSc: 0-No pain         Complications: No apparent anesthesia complications

## 2016-01-01 NOTE — H&P (View-Only) (Signed)
Reason for Consult:CAD s/p NSTEMI Referring Physician: Dr. Burnett Harry [name] is an 55 y.o. male.   HPI: 55 yo male who presents with a cc/o CP  [name] is a 55 yo man with no significant PMH (has not been seen regularly by MD).  He has a family history of diabetes but no history of CAD. He began experiencing chest pain about 5 days ago. The pain radiated to his throat. It usually occurred wit exposure to cold air or liquids. He had the pain on and off until Friday evening when it became severe and his wife finally got him to come to the ED. Pain resolved with NTG.  His ECG showed T wave changes laterally, but he did r/i for an MI with a troponin of 6.97. He was also noted to have a fasting blood sugar of 290.  He remained stable over the weekend and today underwent cardiac catheterization. He was found to have severe 2 vessel CAD with a diffusely diseased RCA and totally occluded circumflex (maybe very small). He had moderate LAD disease.   History reviewed. No pertinent past medical history.  Diagnosed with hypertension and type II diabetes during this admission  History reviewed. No pertinent surgical history.  History reviewed. No pertinent family history. +DM- Brother  Social History:  has no tobacco, alcohol, and drug history on file.  Allergies: No Known Allergies  Medications:  Scheduled: . aspirin EC  81 mg Oral Daily  . atorvastatin  80 mg Oral q1800  . [START ON 12/26/2015] enoxaparin (LOVENOX) injection  40 mg Subcutaneous Q24H  . insulin aspart  0-15 Units Subcutaneous Q4H  . insulin glargine  10 Units Subcutaneous QHS  . insulin starter kit- syringes  1 kit Other Once  . living well with diabetes book- in spanish   Does not apply Once  . metoprolol tartrate  25 mg Oral BID  . sodium chloride flush  3 mL Intravenous Q12H    Results for orders placed or performed during the hospital encounter of 12/23/15 (from the past 48 hour(s))  Heparin  level (unfractionated)     Status: Abnormal   Collection Time: 12/23/15  8:58 PM  Result Value Ref Range   Heparin Unfractionated <0.10 (L) 0.30 - 0.70 IU/mL    Comment:        IF HEPARIN RESULTS ARE BELOW EXPECTED VALUES, AND PATIENT DOSAGE HAS BEEN CONFIRMED, SUGGEST FOLLOW UP TESTING OF ANTITHROMBIN III LEVELS. REPEATED TO VERIFY   Glucose, capillary     Status: Abnormal   Collection Time: 12/23/15  9:47 PM  Result Value Ref Range   Glucose-Capillary 243 (H) 65 - 99 mg/dL   Comment 1 Capillary Specimen   Troponin I     Status: Abnormal   Collection Time: 12/24/15  1:56 AM  Result Value Ref Range   Troponin I 6.97 (HH) <0.03 ng/mL    Comment: CRITICAL RESULT CALLED TO, READ BACK BY AND VERIFIED WITH: CORO J,RN 12/24/15 0250 WAYK   CBC     Status: Abnormal   Collection Time: 12/24/15  3:12 AM  Result Value Ref Range   WBC 7.9 4.0 - 10.5 K/uL    Comment: QUESTIONABLE RESULTS, RECOMMEND RECOLLECT TO VERIFY   RBC 2.89 (L) 4.22 - 5.81 MIL/uL    Comment: QUESTIONABLE RESULTS, RECOMMEND RECOLLECT TO VERIFY   Hemoglobin 8.5 (L) 13.0 - 17.0 g/dL    Comment: QUESTIONABLE RESULTS, RECOMMEND RECOLLECT TO VERIFY   HCT 29.2 (L) 39.0 - 52.0 %  Comment: QUESTIONABLE RESULTS, RECOMMEND RECOLLECT TO VERIFY CORA JUDY RN QUESTIONS THE PATIENTS RESULTS. REQUESTS RECOLLECT.    MCV 101.0 (H) 78.0 - 100.0 fL    Comment: QUESTIONABLE RESULTS, RECOMMEND RECOLLECT TO VERIFY   MCH 29.4 26.0 - 34.0 pg    Comment: QUESTIONABLE RESULTS, RECOMMEND RECOLLECT TO VERIFY   MCHC 29.1 (L) 30.0 - 36.0 g/dL    Comment: QUESTIONABLE RESULTS, RECOMMEND RECOLLECT TO VERIFY   RDW 16.4 (H) 11.5 - 15.5 %    Comment: QUESTIONABLE RESULTS, RECOMMEND RECOLLECT TO VERIFY   Platelets 326 150 - 400 K/uL    Comment: QUESTIONABLE RESULTS, RECOMMEND RECOLLECT TO VERIFY  Lipid panel     Status: Abnormal   Collection Time: 12/24/15  3:12 AM  Result Value Ref Range   Cholesterol 173 0 - 200 mg/dL   Triglycerides 275  (H) <150 mg/dL   HDL 34 (L) >40 mg/dL   Total CHOL/HDL Ratio 5.1 RATIO   VLDL 55 (H) 0 - 40 mg/dL   LDL Cholesterol 84 0 - 99 mg/dL    Comment:        Total Cholesterol/HDL:CHD Risk Coronary Heart Disease Risk Table                     Men   Women  1/2 Average Risk   3.4   3.3  Average Risk       5.0   4.4  2 X Average Risk   9.6   7.1  3 X Average Risk  23.4   11.0        Use the calculated Patient Ratio above and the CHD Risk Table to determine the patient's CHD Risk.        ATP III CLASSIFICATION (LDL):  <100     mg/dL   Optimal  100-129  mg/dL   Near or Above                    Optimal  130-159  mg/dL   Borderline  160-189  mg/dL   High  >190     mg/dL   Very High   Heparin level (unfractionated)     Status: Abnormal   Collection Time: 12/24/15  3:12 AM  Result Value Ref Range   Heparin Unfractionated 0.10 (L) 0.30 - 0.70 IU/mL    Comment:        IF HEPARIN RESULTS ARE BELOW EXPECTED VALUES, AND PATIENT DOSAGE HAS BEEN CONFIRMED, SUGGEST FOLLOW UP TESTING OF ANTITHROMBIN III LEVELS.   CBC     Status: None   Collection Time: 12/24/15  4:47 AM  Result Value Ref Range   WBC 8.7 4.0 - 10.5 K/uL   RBC 4.68 4.22 - 5.81 MIL/uL   Hemoglobin 14.2 13.0 - 17.0 g/dL    Comment: REPEATED TO VERIFY RESULTS VERIFIED VIA RECOLLECT    HCT 42.2 39.0 - 52.0 %   MCV 90.2 78.0 - 100.0 fL    Comment: REPEATED TO VERIFY RESULTS VERIFIED VIA RECOLLECT    MCH 30.3 26.0 - 34.0 pg   MCHC 33.6 30.0 - 36.0 g/dL   RDW 12.9 11.5 - 15.5 %   Platelets 289 150 - 400 K/uL  Hemoglobin A1c     Status: Abnormal   Collection Time: 12/24/15  4:47 AM  Result Value Ref Range   Hgb A1c MFr Bld 11.7 (H) 4.8 - 5.6 %    Comment: (NOTE)         Pre-diabetes: 5.7 -  6.4         Diabetes: >6.4         Glycemic control for adults with diabetes: <7.0    Mean Plasma Glucose 289 mg/dL    Comment: (NOTE) Performed At: [facility] [place], [place] [id number] Lindon Romp  MD NW:[id number]   Glucose, capillary     Status: Abnormal   Collection Time: 12/24/15  7:38 AM  Result Value Ref Range   Glucose-Capillary 249 (H) 65 - 99 mg/dL   Comment 1 Notify RN   Glucose, capillary     Status: Abnormal   Collection Time: 12/24/15 12:27 PM  Result Value Ref Range   Glucose-Capillary 259 (H) 65 - 99 mg/dL   Comment 1 Notify RN   Heparin level (unfractionated)     Status: None   Collection Time: 12/24/15 12:56 PM  Result Value Ref Range   Heparin Unfractionated 0.33 0.30 - 0.70 IU/mL    Comment:        IF HEPARIN RESULTS ARE BELOW EXPECTED VALUES, AND PATIENT DOSAGE HAS BEEN CONFIRMED, SUGGEST FOLLOW UP TESTING OF ANTITHROMBIN III LEVELS.   Lipid panel     Status: Abnormal   Collection Time: 12/24/15 12:56 PM  Result Value Ref Range   Cholesterol 171 0 - 200 mg/dL   Triglycerides 261 (H) <150 mg/dL   HDL 20 (L) >40 mg/dL   Total CHOL/HDL Ratio 8.6 RATIO   VLDL 52 (H) 0 - 40 mg/dL   LDL Cholesterol 99 0 - 99 mg/dL    Comment:        Total Cholesterol/HDL:CHD Risk Coronary Heart Disease Risk Table                     Men   Women  1/2 Average Risk   3.4   3.3  Average Risk       5.0   4.4  2 X Average Risk   9.6   7.1  3 X Average Risk  23.4   11.0        Use the calculated Patient Ratio above and the CHD Risk Table to determine the patient's CHD Risk.        ATP III CLASSIFICATION (LDL):  <100     mg/dL   Optimal  100-129  mg/dL   Near or Above                    Optimal  130-159  mg/dL   Borderline  160-189  mg/dL   High  >190     mg/dL   Very High   Glucose, capillary     Status: Abnormal   Collection Time: 12/24/15  4:44 PM  Result Value Ref Range   Glucose-Capillary 209 (H) 65 - 99 mg/dL   Comment 1 Capillary Specimen   Heparin level (unfractionated)     Status: None   Collection Time: 12/24/15  6:47 PM  Result Value Ref Range   Heparin Unfractionated 0.34 0.30 - 0.70 IU/mL    Comment:        IF HEPARIN RESULTS ARE BELOW EXPECTED  VALUES, AND PATIENT DOSAGE HAS BEEN CONFIRMED, SUGGEST FOLLOW UP TESTING OF ANTITHROMBIN III LEVELS.   Glucose, capillary     Status: Abnormal   Collection Time: 12/24/15  8:51 PM  Result Value Ref Range   Glucose-Capillary 242 (H) 65 - 99 mg/dL   Comment 1 Capillary Specimen   Glucose, capillary  Status: Abnormal   Collection Time: 12/25/15 12:33 AM  Result Value Ref Range   Glucose-Capillary 238 (H) 65 - 99 mg/dL   Comment 1 Capillary Specimen   CBC     Status: None   Collection Time: 12/25/15  2:39 AM  Result Value Ref Range   WBC 8.8 4.0 - 10.5 K/uL   RBC 4.65 4.22 - 5.81 MIL/uL   Hemoglobin 14.0 13.0 - 17.0 g/dL   HCT 41.9 39.0 - 52.0 %   MCV 90.1 78.0 - 100.0 fL   MCH 30.1 26.0 - 34.0 pg   MCHC 33.4 30.0 - 36.0 g/dL   RDW 12.8 11.5 - 15.5 %   Platelets 311 150 - 400 K/uL  Heparin level (unfractionated)     Status: Abnormal   Collection Time: 12/25/15  2:39 AM  Result Value Ref Range   Heparin Unfractionated 0.29 (L) 0.30 - 0.70 IU/mL    Comment:        IF HEPARIN RESULTS ARE BELOW EXPECTED VALUES, AND PATIENT DOSAGE HAS BEEN CONFIRMED, SUGGEST FOLLOW UP TESTING OF ANTITHROMBIN III LEVELS.   Glucose, capillary     Status: Abnormal   Collection Time: 12/25/15  4:31 AM  Result Value Ref Range   Glucose-Capillary 178 (H) 65 - 99 mg/dL   Comment 1 Capillary Specimen   Glucose, capillary     Status: Abnormal   Collection Time: 12/25/15  7:43 AM  Result Value Ref Range   Glucose-Capillary 159 (H) 65 - 99 mg/dL   Comment 1 Notify RN   Glucose, capillary     Status: Abnormal   Collection Time: 12/25/15 12:30 PM  Result Value Ref Range   Glucose-Capillary 185 (H) 65 - 99 mg/dL   Comment 1 Notify RN   Glucose, capillary     Status: Abnormal   Collection Time: 12/25/15  1:12 PM  Result Value Ref Range   Glucose-Capillary 200 (H) 65 - 99 mg/dL   Comment 1 Capillary Specimen   CBC     Status: None   Collection Time: 12/25/15  3:13 PM  Result Value Ref Range    WBC 6.5 4.0 - 10.5 K/uL   RBC 4.59 4.22 - 5.81 MIL/uL   Hemoglobin 13.8 13.0 - 17.0 g/dL   HCT 41.1 39.0 - 52.0 %   MCV 89.5 78.0 - 100.0 fL   MCH 30.1 26.0 - 34.0 pg   MCHC 33.6 30.0 - 36.0 g/dL   RDW 12.8 11.5 - 15.5 %   Platelets 314 150 - 400 K/uL  Creatinine, serum     Status: None   Collection Time: 12/25/15  3:13 PM  Result Value Ref Range   Creatinine, Ser 0.61 0.61 - 1.24 mg/dL   GFR calc non Af Amer >60 >60 mL/min   GFR calc Af Amer >60 >60 mL/min    Comment: (NOTE) The eGFR has been calculated using the CKD EPI equation. This calculation has not been validated in all clinical situations. eGFR's persistently <60 mL/min signify possible Chronic Kidney Disease.   Glucose, capillary     Status: Abnormal   Collection Time: 12/25/15  4:15 PM  Result Value Ref Range   Glucose-Capillary 130 (H) 65 - 99 mg/dL   Comment 1 Notify RN     No results found.  Review of Systems  Constitutional: Positive for malaise/fatigue. Negative for chills and fever.  Eyes: Negative for blurred vision and double vision.  Cardiovascular: Positive for chest pain and orthopnea. Negative for palpitations and claudication.  Gastrointestinal: Negative for blood in stool, nausea and vomiting.  Genitourinary: Negative for dysuria, frequency and urgency.  Musculoskeletal: Positive for back pain and joint pain.  Neurological: Negative for focal weakness, seizures and loss of consciousness.  All other systems reviewed and are negative.  Blood pressure 132/84, pulse 75, temperature 97.6 F (36.4 C), temperature source Oral, resp. rate 17, height 5' 10"  (1.778 m), weight 161 lb 2.5 oz (73.1 kg), SpO2 99 %. Physical Exam  Vitals reviewed. Constitutional: He is oriented to person, place, and time. He appears well-developed and well-nourished. No distress.  HENT:  Head: Normocephalic and atraumatic.  Eyes: Conjunctivae and EOM are normal. Pupils are equal, round, and reactive to light. No scleral  icterus.  Neck: Neck supple. No thyromegaly present.  No carotid bruits  Cardiovascular: Normal rate, regular rhythm, normal heart sounds and intact distal pulses.  Exam reveals no gallop and no friction rub.   No murmur heard. Normal Allen's test on left  Respiratory: Effort normal and breath sounds normal. No respiratory distress. He has no wheezes. He has no rales.  GI: Soft. He exhibits no distension. There is no tenderness.  Musculoskeletal: He exhibits no edema.  Lymphadenopathy:    He has no cervical adenopathy.  Neurological: He is alert and oriented to person, place, and time. No cranial nerve deficit.  Motor intact  Skin: Skin is warm and dry.   Conclusion     Mid RCA lesion, 60 %stenosed.  Dist RCA lesion, 85 %stenosed.  RPDA lesion, 75 %stenosed.  3rd RPLB lesion, 100 %stenosed.  Mid LAD to Dist LAD lesion, 65 %stenosed.  Ost 1st Diag to 1st Diag lesion, 75 %stenosed.  1st Diag lesion, 95 %stenosed.  Ramus lesion, 95 %stenosed.  Prox Cx to Mid Cx lesion, 100 %stenosed.  The left ventricular ejection fraction is 45-50% by visual estimate.    Severe diffuse multivessel coronary disease including total occlusion of the proximal circumflex (feels left to left and right-to-left by collaterals), severe multifocal disease throughout the mid, distal, and continuation of the RCA.  LAD is diffusely diseased with up to 60-70% stenosis in the midsegment. A large diagonal also has segmental severe stenosis.  Left ventricular systolic dysfunction with anterior wall hypokinesis. EF 40-50%.   RECOMMENDATIONS:   Aggressive risk factor modification  Medical therapy for CAD.  If unacceptable symptoms, CABG would be needed.  Have initially started Plavix but on second thought we'll discontinue this medication. No doses were given in the cath lab.    I personally reviewed the cath images and concur with the findings noted above  Assessment/Plan:  55 yo man  with no significant past medical history in part due to limited access to health care who presents with a non-STEMI and has been found to have severe 2 vessel CAD and moderate LAD involvement.  There is a question as to whether the LAD disease is hemodynamically significant. I think the best option is to perform and FFR assessment on the LAD. If there is impairment of flow CABG would be best option. Otherwise, I would agree with a trial of medical management.  Discussed with Dr. Gailen Shelter 12/25/2015, 6:14 PM

## 2016-01-01 NOTE — Progress Notes (Signed)
Pt came down to [place] with a 20g IV. I tried to restart IV x 2 but cath would not thread. Loni Beckwith CRNA also tried x 1 and it would not thread.Dr.B Jillyn Hidden aware

## 2016-01-01 NOTE — Brief Op Note (Addendum)
12/23/2015 - 01/01/2016  8:18 PM  PATIENT:  Kurt Mathis  55 y.o. male      [address]       [place] [address]             [phone]     12/23/2015 - 01/01/2016  8:19 PM  PATIENT:  Kurt Mathis  55 y.o. male  PRE-OPERATIVE DIAGNOSIS:  3 VESSEL CAD  POST-OPERATIVE DIAGNOSIS:  3 VESSEL CAD  PROCEDURE:  Procedure(s): CORONARY ARTERY BYPASS GRAFTING (CABG) x 5  LIMA to LAD  SEQ SVG to RCA and PDA  SVG to DIAGONAL  LEFT RADIAL to OM1 TRANSESOPHAGEAL ECHOCARDIOGRAM (TEE) BILATERAL GREATER SAPHENOUS VEINS HARVESTED ENDOSCOPICALLY (EVH)  SURGEON:  Surgeon(s): [name], MD  PHYSICIAN ASSISTANT: WAYNE GOLD PA-C  ANESTHESIA:   general  PATIENT CONDITION:  ICU - intubated and hemodynamically stable.  PRE-OPERATIVE WEIGHT: [id number]  COMPLICATIONS: NO KNOWN  XC= 53min CPB= 150 min off in SR, dopamine @ 3  Severe diffuse CAD, poor target vessels PDA intramyocardial POOR candidate for Redo CABG

## 2016-01-01 NOTE — Anesthesia Preprocedure Evaluation (Addendum)
Anesthesia Evaluation  Patient identified by MRN, date of birth, ID band Patient awake    Reviewed: Allergy & Precautions, H&P , NPO status , Patient's Chart, lab work & pertinent test results  History of Anesthesia Complications Negative for: history of anesthetic complications  Airway Mallampati: II  TM Distance: >3 FB Neck ROM: full    Dental no notable dental hx.    Pulmonary neg pulmonary ROS,    Pulmonary exam normal breath sounds clear to auscultation       Cardiovascular hypertension, + CAD and + Past MI  Normal cardiovascular exam Rhythm:regular Rate:Normal     Neuro/Psych negative neurological ROS     GI/Hepatic negative GI ROS, Neg liver ROS,   Endo/Other  diabetes, Poorly Controlled, Type 2, Insulin Dependent  Renal/GU negative Renal ROS     Musculoskeletal   Abdominal   Peds  Hematology negative hematology ROS (+)   Anesthesia Other Findings   Reproductive/Obstetrics negative OB ROS                            Anesthesia Physical Anesthesia Plan  ASA: IV  Anesthesia Plan: General   Post-op Pain Management:    Induction: Intravenous  Airway Management Planned: Oral ETT  Additional Equipment: Arterial line, TEE, CVP, PA Cath and Ultrasound Guidance Line Placement  Intra-op Plan:   Post-operative Plan: Post-operative intubation/ventilation  Informed Consent: I have reviewed the patients History and Physical, chart, labs and discussed the procedure including the risks, benefits and alternatives for the proposed anesthesia with the patient or authorized representative who has indicated his/her understanding and acceptance.   Dental Advisory Given  Plan Discussed with: Anesthesiologist, CRNA and Surgeon  Anesthesia Plan Comments: (Right radial A line)       Anesthesia Quick Evaluation

## 2016-01-02 ENCOUNTER — Inpatient Hospital Stay (HOSPITAL_COMMUNITY): Payer: Self-pay

## 2016-01-02 ENCOUNTER — Encounter (HOSPITAL_COMMUNITY): Payer: Self-pay | Admitting: Thoracic Surgery (Cardiothoracic Vascular Surgery)

## 2016-01-02 LAB — POCT I-STAT, CHEM 8
BUN: 8 mg/dL (ref 6–20)
CREATININE: 0.5 mg/dL — AB (ref 0.61–1.24)
Calcium, Ion: 1.17 mmol/L (ref 1.15–1.40)
Chloride: 101 mmol/L (ref 101–111)
Glucose, Bld: 128 mg/dL — ABNORMAL HIGH (ref 65–99)
HEMATOCRIT: 22 % — AB (ref 39.0–52.0)
HEMOGLOBIN: 7.5 g/dL — AB (ref 13.0–17.0)
Potassium: 4.7 mmol/L (ref 3.5–5.1)
SODIUM: 138 mmol/L (ref 135–145)
TCO2: 23 mmol/L (ref 0–100)

## 2016-01-02 LAB — CBC
HCT: 24.3 % — ABNORMAL LOW (ref 39.0–52.0)
HCT: 22.4 % — ABNORMAL LOW (ref 39.0–52.0)
Hemoglobin: 8.2 g/dL — ABNORMAL LOW (ref 13.0–17.0)
Hemoglobin: 7.5 g/dL — ABNORMAL LOW (ref 13.0–17.0)
MCH: 30.1 pg (ref 26.0–34.0)
MCH: 30.2 pg (ref 26.0–34.0)
MCHC: 33.7 g/dL (ref 30.0–36.0)
MCHC: 33.5 g/dL (ref 30.0–36.0)
MCV: 89.3 fL (ref 78.0–100.0)
MCV: 90.3 fL (ref 78.0–100.0)
PLATELETS: 221 10*3/uL (ref 150–400)
PLATELETS: 238 10*3/uL (ref 150–400)
RBC: 2.72 MIL/uL — ABNORMAL LOW (ref 4.22–5.81)
RBC: 2.48 MIL/uL — AB (ref 4.22–5.81)
RDW: 12.6 % (ref 11.5–15.5)
RDW: 12.9 % (ref 11.5–15.5)
WBC: 16.7 10*3/uL — ABNORMAL HIGH (ref 4.0–10.5)
WBC: 12.3 10*3/uL — ABNORMAL HIGH (ref 4.0–10.5)

## 2016-01-02 LAB — GLUCOSE, CAPILLARY
GLUCOSE-CAPILLARY: 150 mg/dL — AB (ref 65–99)
GLUCOSE-CAPILLARY: 128 mg/dL — AB (ref 65–99)
GLUCOSE-CAPILLARY: 163 mg/dL — AB (ref 65–99)
GLUCOSE-CAPILLARY: 92 mg/dL (ref 65–99)
GLUCOSE-CAPILLARY: 92 mg/dL (ref 65–99)
GLUCOSE-CAPILLARY: 91 mg/dL (ref 65–99)
Glucose-Capillary: 100 mg/dL — ABNORMAL HIGH (ref 65–99)
Glucose-Capillary: 114 mg/dL — ABNORMAL HIGH (ref 65–99)
Glucose-Capillary: 130 mg/dL — ABNORMAL HIGH (ref 65–99)
Glucose-Capillary: 133 mg/dL — ABNORMAL HIGH (ref 65–99)
Glucose-Capillary: 137 mg/dL — ABNORMAL HIGH (ref 65–99)
Glucose-Capillary: 138 mg/dL — ABNORMAL HIGH (ref 65–99)
Glucose-Capillary: 142 mg/dL — ABNORMAL HIGH (ref 65–99)
Glucose-Capillary: 143 mg/dL — ABNORMAL HIGH (ref 65–99)
Glucose-Capillary: 94 mg/dL (ref 65–99)
Glucose-Capillary: 94 mg/dL (ref 65–99)
Glucose-Capillary: 97 mg/dL (ref 65–99)

## 2016-01-02 LAB — POCT I-STAT 3, ART BLOOD GAS (G3+)
ACID-BASE DEFICIT: 1 mmol/L (ref 0.0–2.0)
ACID-BASE DEFICIT: 3 mmol/L — AB (ref 0.0–2.0)
ACID-BASE DEFICIT: 4 mmol/L — AB (ref 0.0–2.0)
Acid-base deficit: 4 mmol/L — ABNORMAL HIGH (ref 0.0–2.0)
BICARBONATE: 24.1 mmol/L (ref 20.0–28.0)
BICARBONATE: 22.5 mmol/L (ref 20.0–28.0)
BICARBONATE: 23.1 mmol/L (ref 20.0–28.0)
BICARBONATE: 21.8 mmol/L (ref 20.0–28.0)
O2 SAT: 100 %
O2 Saturation: 99 %
O2 Saturation: 99 %
O2 Saturation: 99 %
PH ART: 7.397 (ref 7.350–7.450)
PO2 ART: 169 mmHg — AB (ref 83.0–108.0)
PO2 ART: 162 mmHg — AB (ref 83.0–108.0)
Patient temperature: 36.7
TCO2: 25 mmol/L (ref 0–100)
TCO2: 24 mmol/L (ref 0–100)
TCO2: 24 mmol/L (ref 0–100)
TCO2: 23 mmol/L (ref 0–100)
pCO2 arterial: 39 mmHg (ref 32.0–48.0)
pCO2 arterial: 46.7 mmHg (ref 32.0–48.0)
pCO2 arterial: 43.3 mmHg (ref 32.0–48.0)
pCO2 arterial: 42.7 mmHg (ref 32.0–48.0)
pH, Arterial: 7.29 — ABNORMAL LOW (ref 7.350–7.450)
pH, Arterial: 7.335 — ABNORMAL LOW (ref 7.350–7.450)
pH, Arterial: 7.316 — ABNORMAL LOW (ref 7.350–7.450)
pO2, Arterial: 171 mmHg — ABNORMAL HIGH (ref 83.0–108.0)
pO2, Arterial: 178 mmHg — ABNORMAL HIGH (ref 83.0–108.0)

## 2016-01-02 LAB — SURGICAL PCR SCREEN
MRSA, PCR: NEGATIVE
Staphylococcus aureus: NEGATIVE

## 2016-01-02 LAB — BASIC METABOLIC PANEL
Anion gap: 6 (ref 5–15)
BUN: 8 mg/dL (ref 6–20)
CALCIUM: 7.8 mg/dL — AB (ref 8.9–10.3)
CO2: 23 mmol/L (ref 22–32)
CREATININE: 0.66 mg/dL (ref 0.61–1.24)
Chloride: 109 mmol/L (ref 101–111)
Glucose, Bld: 135 mg/dL — ABNORMAL HIGH (ref 65–99)
Potassium: 4.3 mmol/L (ref 3.5–5.1)
SODIUM: 138 mmol/L (ref 135–145)

## 2016-01-02 LAB — CREATININE, SERUM
CREATININE: 0.69 mg/dL (ref 0.61–1.24)
GFR calc non Af Amer: >60 mL/min (ref >60)

## 2016-01-02 LAB — POCT I-STAT 4, (NA,K, GLUC, HGB,HCT)
GLUCOSE: 197 mg/dL — AB (ref 65–99)
HCT: 29 % — ABNORMAL LOW (ref 39.0–52.0)
Hemoglobin: 9.9 g/dL — ABNORMAL LOW (ref 13.0–17.0)
POTASSIUM: 3.6 mmol/L (ref 3.5–5.1)
SODIUM: 141 mmol/L (ref 135–145)

## 2016-01-02 LAB — MAGNESIUM
MAGNESIUM: 2.9 mg/dL — AB (ref 1.7–2.4)
MAGNESIUM: 2.3 mg/dL (ref 1.7–2.4)

## 2016-01-02 MED ORDER — INSULIN DETEMIR 100 UNIT/ML ~~LOC~~ SOLN: 25.0000 [IU] | Freq: Two times a day (BID) | SUBCUTANEOUS | Status: DC

## 2016-01-02 MED ORDER — INSULIN DETEMIR 100 UNIT/ML ~~LOC~~ SOLN
20.0000 [IU] | Freq: Two times a day (BID) | SUBCUTANEOUS | Status: DC
  Administered 2016-01-02: 20 [IU] via SUBCUTANEOUS
  Filled 2016-01-02 (×2): qty 0.2

## 2016-01-02 MED ORDER — POTASSIUM CHLORIDE IN NACL 20-0.9 MEQ/L-% IV SOLN
INTRAVENOUS | Status: DC
  Administered 2016-01-02: 18:00:00 via INTRAVENOUS
  Filled 2016-01-02 (×3): qty 1000

## 2016-01-02 MED ORDER — INSULIN ASPART 100 UNIT/ML ~~LOC~~ SOLN
0.0000 [IU] | SUBCUTANEOUS | Status: DC
  Administered 2016-01-02 (×2): 2 [IU] via SUBCUTANEOUS

## 2016-01-02 MED ORDER — ATORVASTATIN CALCIUM 40 MG PO TABS
40.0000 mg | ORAL_TABLET | Freq: Every day | ORAL | Status: DC
  Administered 2016-01-02: 40 mg via ORAL
  Filled 2016-01-02: qty 1

## 2016-01-02 MED ORDER — GABAPENTIN 300 MG PO CAPS
300.0000 mg | ORAL_CAPSULE | Freq: Three times a day (TID) | ORAL | Status: DC
  Administered 2016-01-02 – 2016-01-05 (×11): 300 mg via ORAL
  Filled 2016-01-02 (×11): qty 1

## 2016-01-02 MED ORDER — INSULIN DETEMIR 100 UNIT/ML ~~LOC~~ SOLN
25.0000 [IU] | Freq: Two times a day (BID) | SUBCUTANEOUS | Status: DC
  Filled 2016-01-02 (×2): qty 0.25

## 2016-01-02 MED ORDER — CYCLOBENZAPRINE HCL 10 MG PO TABS
10.0000 mg | ORAL_TABLET | Freq: Two times a day (BID) | ORAL | Status: DC | PRN
Start: 2016-01-02 — End: 2016-01-05
  Filled 2016-01-02: qty 1

## 2016-01-02 MED ORDER — ENOXAPARIN SODIUM 40 MG/0.4ML ~~LOC~~ SOLN
40.0000 mg | Freq: Every day | SUBCUTANEOUS | Status: DC
  Administered 2016-01-02 – 2016-01-04 (×3): 40 mg via SUBCUTANEOUS
  Filled 2016-01-02 (×3): qty 0.4

## 2016-01-02 MED ORDER — KETOROLAC TROMETHAMINE 30 MG/ML IJ SOLN
30.0000 mg | Freq: Four times a day (QID) | INTRAMUSCULAR | Status: DC
  Administered 2016-01-02 (×3): 30 mg via INTRAVENOUS
  Filled 2016-01-02 (×3): qty 1

## 2016-01-02 MED ORDER — METOCLOPRAMIDE HCL 5 MG/ML IJ SOLN
10.0000 mg | Freq: Four times a day (QID) | INTRAMUSCULAR | Status: AC
  Administered 2016-01-02 (×4): 10 mg via INTRAVENOUS
  Filled 2016-01-02 (×4): qty 2

## 2016-01-02 MED ORDER — ISOSORBIDE MONONITRATE ER 30 MG PO TB24
30.0000 mg | ORAL_TABLET | Freq: Every day | ORAL | Status: DC
  Administered 2016-01-02 – 2016-01-03 (×2): 30 mg via ORAL
  Filled 2016-01-02 (×2): qty 1

## 2016-01-02 MED ORDER — SODIUM CHLORIDE 0.9 % IV SOLN: INTRAVENOUS | Status: DC

## 2016-01-02 MED ORDER — ORAL CARE MOUTH RINSE
15.0000 mL | Freq: Two times a day (BID) | OROMUCOSAL | Status: DC
  Administered 2016-01-02 – 2016-01-03 (×3): 15 mL via OROMUCOSAL

## 2016-01-02 MED ORDER — INSULIN ASPART 100 UNIT/ML ~~LOC~~ SOLN: 4.0000 [IU] | Freq: Three times a day (TID) | SUBCUTANEOUS | Status: DC

## 2016-01-02 MED FILL — Sodium Bicarbonate IV Soln 8.4%: INTRAVENOUS | Qty: 50 | Status: AC

## 2016-01-02 MED FILL — Sodium Chloride IV Soln 0.9%: INTRAVENOUS | Qty: 2000 | Status: AC

## 2016-01-02 MED FILL — Mannitol IV Soln 20%: INTRAVENOUS | Qty: 500 | Status: AC

## 2016-01-02 MED FILL — Lidocaine HCl IV Inj 20 MG/ML: INTRAVENOUS | Qty: 5 | Status: AC

## 2016-01-02 MED FILL — Albumin, Human Inj 5%: INTRAVENOUS | Qty: 250 | Status: AC

## 2016-01-02 MED FILL — Heparin Sodium (Porcine) Inj 1000 Unit/ML: INTRAMUSCULAR | Qty: 10 | Status: AC

## 2016-01-02 MED FILL — Electrolyte-R (PH 7.4) Solution: INTRAVENOUS | Qty: 4000 | Status: AC

## 2016-01-02 NOTE — Progress Notes (Signed)
Pt. Placed back on full support per MD. Pt. Not ready to complete wean protocol.

## 2016-01-02 NOTE — Progress Notes (Signed)
Patient ID: Kurt Mathis, male   DOB: [birth year], 55 y.o.   MRN: [id number] EVENING ROUNDS NOTE :     [address]       [place], [place] [address]             [phone]                 1 Day Post-Op Procedure(s) (LRB): CORONARY ARTERY BYPASS GRAFTING (CABG), ON PUMP, TIMES 5, USING LEFT RADIAL ARTERY AND LEFT INTERNAL MAMMARY ARTERY, BILATERAL GREATER SAPHENOUS VEINS HARVESTED ENDOSCOPICALLY (N/A) TRANSESOPHAGEAL ECHOCARDIOGRAM (TEE) (N/A) LEFT RADIAL ARTERY HARVEST (Left)  Total Length of Stay:  LOS: 10 days  BP 110/62   Pulse 81   Temp 97.3 F (36.3 C) (Oral)   Resp 18   Ht 5\' 10"  (1.778 m)   Wt 170 lb 3.1 oz (77.2 kg)   SpO2 100%   BMI 24.42 kg/m   .Intake/Output      09/25 0701 - 09/26 0700 09/26 0701 - 09/27 0700   P.O.  150   I.V. (mL/kg) 3088.8 (40) 706.4 (9.1)   Blood 350    IV Piggyback 600 50   Total Intake(mL/kg) 4038.8 (52.3) 906.4 (11.7)   Urine (mL/kg/hr) 3235 (1.7) 500 (0.5)   Blood 1000 (0.5)    Chest Tube 250 (0.1)    Total Output 4485 500   Net -446.3 +406.4          . sodium chloride Stopped (01/02/16 1545)  . sodium chloride    . sodium chloride Stopped (01/02/16 1545)  . 0.9 % NaCl with KCl 20 mEq / L 75 mL/hr at 01/02/16 1804  . dexmedetomidine Stopped (01/02/16 0100)  . insulin (NOVOLIN-R) infusion Stopped (01/02/16 1351)  . lactated ringers Stopped (01/02/16 0100)  . lactated ringers 20 mL/hr at 01/02/16 1700  . phenylephrine (NEO-SYNEPHRINE) Adult infusion 15.067 mcg/min (01/02/16 1700)     Lab Results  Component Value Date   WBC 12.3 (H) 01/02/2016   HGB 7.5 (L) 01/02/2016   HCT 22.0 (L) 01/02/2016   PLT 238 01/02/2016   GLUCOSE 128 (H) 01/02/2016   CHOL 171 12/24/2015   TRIG 261 (H) 12/24/2015   HDL 20 (L) 12/24/2015   LDLCALC 99 12/24/2015   ALT 40 12/23/2015   AST 48 (H) 12/23/2015   NA 138 01/02/2016   K 4.7 01/02/2016   CL 101 01/02/2016   CREATININE 0.50 (L) 01/02/2016   BUN 8 01/02/2016   CO2  23 01/02/2016   TSH 1.550 12/23/2015   INR 1.84 01/01/2016   HGBA1C 11.7 (H) 12/24/2015   Up to chair today Stable rhythm  Grace Isaac MD  Beeper [phone] Office [phone] 01/02/2016 6:49 PM

## 2016-01-02 NOTE — Progress Notes (Signed)
1 Day Post-Op Procedure(s) (LRB): CORONARY ARTERY BYPASS GRAFTING (CABG), ON PUMP, TIMES 5, USING LEFT RADIAL ARTERY AND LEFT INTERNAL MAMMARY ARTERY, BILATERAL GREATER SAPHENOUS VEINS HARVESTED ENDOSCOPICALLY (N/A) TRANSESOPHAGEAL ECHOCARDIOGRAM (TEE) (N/A) LEFT RADIAL ARTERY HARVEST (Left) Subjective: C/o incisional pain  Objective: Vital signs in last 24 hours: Temp:  [97.9 F (36.6 C)-99.3 F (37.4 C)] 99.3 F (37.4 C) (09/26 0700) Pulse Rate:  [73-85] 82 (09/26 0700) Cardiac Rhythm: Normal sinus rhythm (09/25 2305) Resp:  [12-25] 18 (09/26 0700) BP: (94-138)/(59-70) 97/64 (09/26 0700) SpO2:  [99 %-100 %] 100 % (09/26 0700) Arterial Line BP: (98-136)/(47-62) 99/47 (09/26 0700) FiO2 (%):  [40 %] 40 % (09/26 0400) Weight:  [170 lb 3.1 oz (77.2 kg)] 170 lb 3.1 oz (77.2 kg) (09/26 0500)  Hemodynamic parameters for last 24 hours: PAP: (20-30)/(6-14) 23/8 CO:  [3.5 L/min-5.1 L/min] 5.1 L/min CI:  [1.9 L/min/m2-2.7 L/min/m2] 2.7 L/min/m2  Intake/Output from previous day: 09/25 0701 - 09/26 0700 In: 4038.8 [I.V.:3088.8; Blood:350; IV Piggyback:600] Out: [id number] [Urine:3235; Blood:1000; Chest Tube:250] Intake/Output this shift: No intake/output data recorded.  General appearance: alert, cooperative and no distress Neurologic: intact Heart: regular rate and rhythm and + rub Lungs: diminished breath sounds bibasilar Abdomen: tympanitic, nontender, not distended Extremities: LUE neuro intact with good cap refill  Lab Results:  Recent Labs  01/01/16 2230 01/02/16 0355  WBC 16.6* 16.7*  HGB 9.9* 8.2*  HCT 29.5* 24.3*  PLT 198 221   BMET:  Recent Labs  01/01/16 0626  01/01/16 2123 01/02/16 0355  NA 136  < > 138 138  K 3.9  < > 3.7 4.3  CL 99*  < > 101 109  CO2 28  --   --  23  GLUCOSE 129*  < > 226* 135*  BUN 11  < > 10 8  CREATININE 0.69  < > 0.40* 0.66  CALCIUM 9.4  --   --  7.8*  < > = values in this interval not displayed.  PT/INR:  Recent Labs   01/01/16 2230  LABPROT 21.5*  INR 1.84   ABG    Component Value Date/Time   PHART 7.316 (L) 01/02/2016 0510   HCO3 21.8 01/02/2016 0510   TCO2 23 01/02/2016 0510   ACIDBASEDEF 4.0 (H) 01/02/2016 0510   O2SAT 99.0 01/02/2016 0510   CBG (last 3)   Recent Labs  01/02/16 0604 01/02/16 0709 01/02/16 0818  GLUCAP 142* 114* 100*    Assessment/Plan: S/P Procedure(s) (LRB): CORONARY ARTERY BYPASS GRAFTING (CABG), ON PUMP, TIMES 5, USING LEFT RADIAL ARTERY AND LEFT INTERNAL MAMMARY ARTERY, BILATERAL GREATER SAPHENOUS VEINS HARVESTED ENDOSCOPICALLY (N/A) TRANSESOPHAGEAL ECHOCARDIOGRAM (TEE) (N/A) LEFT RADIAL ARTERY HARVEST (Left) -  CV- s/p CABG x 5   ASA, beta blocker, statin  imdur for radial graft  Add ACE-I prior to dc if BP allows  + rub and J point elevation anteriorly- likely pericarditis- toradol  RESP- IS for bibasilar atelectasis  RENAL= creatinine and lytes OK  ENDO- CBG elevated after dextrose given  Transition from insulin drip to levemir + SSI  GI- gastric dilatation on CXR - Reglan, caution with advancing diet  SCD + enoxaparin for DVT prophylaxis  DC CT  OOB, ambulate   LOS: 10 days    [name] 01/02/2016

## 2016-01-02 NOTE — Progress Notes (Signed)
Wean protocol initiated. 

## 2016-01-02 NOTE — Procedures (Signed)
Extubation Procedure Note  Patient Details:   Name: Kurt Mathis DOB: 12/15/1960 MRN: [id number]   Airway Documentation:  Airway 8 mm (Active)  Secured at (cm) 24 cm 01/01/2016 11:18 PM  Measured From Lips 01/01/2016 11:18 PM  Secured Location Right 01/01/2016 11:18 PM  Secured By Rana Snare Tape 01/01/2016 11:18 PM    Evaluation  O2 sats: stable throughout Complications: No apparent complications Patient did tolerate procedure well. Bilateral Breath Sounds: Clear, Diminished   Yes   Pt. Extubated per rapid wean protocol. Pt. Performed 1.2L on the vital capacity and -40 on the NIF. Pt. Also performed 500x5 on the incentive spirometer. Pt. Also had a positive leak. Pt. Was able to state his name after extubation. Pt. Placed on 4L nasal cannula. Tolerating well.   Marlowe Aschoff 01/02/2016, 4:10 AM

## 2016-01-03 ENCOUNTER — Inpatient Hospital Stay (HOSPITAL_COMMUNITY): Payer: Self-pay

## 2016-01-03 ENCOUNTER — Ambulatory Visit: Payer: Self-pay

## 2016-01-03 LAB — PREPARE RBC (CROSSMATCH)

## 2016-01-03 LAB — GLUCOSE, CAPILLARY
GLUCOSE-CAPILLARY: 180 mg/dL — AB (ref 65–99)
GLUCOSE-CAPILLARY: 182 mg/dL — AB (ref 65–99)
Glucose-Capillary: 110 mg/dL — ABNORMAL HIGH (ref 65–99)
Glucose-Capillary: 141 mg/dL — ABNORMAL HIGH (ref 65–99)

## 2016-01-03 LAB — BASIC METABOLIC PANEL
Anion gap: 6 (ref 5–15)
BUN: 10 mg/dL (ref 6–20)
CALCIUM: 8.1 mg/dL — AB (ref 8.9–10.3)
CHLORIDE: 105 mmol/L (ref 101–111)
CO2: 26 mmol/L (ref 22–32)
CREATININE: 0.66 mg/dL (ref 0.61–1.24)
GFR calc Af Amer: >60 mL/min (ref >60)
GFR calc non Af Amer: >60 mL/min (ref >60)
Glucose, Bld: 130 mg/dL — ABNORMAL HIGH (ref 65–99)
Potassium: 4.4 mmol/L (ref 3.5–5.1)
SODIUM: 137 mmol/L (ref 135–145)

## 2016-01-03 LAB — CBC
HCT: 19.5 % — ABNORMAL LOW (ref 39.0–52.0)
HCT: 19.4 % — ABNORMAL LOW (ref 39.0–52.0)
Hemoglobin: 6.3 g/dL — CL (ref 13.0–17.0)
Hemoglobin: 6.3 g/dL — CL (ref 13.0–17.0)
MCH: 29.6 pg (ref 26.0–34.0)
MCH: 29.7 pg (ref 26.0–34.0)
MCHC: 32.3 g/dL (ref 30.0–36.0)
MCHC: 32.5 g/dL (ref 30.0–36.0)
MCV: 91.5 fL (ref 78.0–100.0)
MCV: 91.5 fL (ref 78.0–100.0)
PLATELETS: 176 10*3/uL (ref 150–400)
Platelets: 175 10*3/uL (ref 150–400)
RBC: 2.13 MIL/uL — ABNORMAL LOW (ref 4.22–5.81)
RBC: 2.12 MIL/uL — ABNORMAL LOW (ref 4.22–5.81)
RDW: 13.1 % (ref 11.5–15.5)
RDW: 13.1 % (ref 11.5–15.5)
WBC: 8.6 10*3/uL (ref 4.0–10.5)
WBC: 8.5 10*3/uL (ref 4.0–10.5)

## 2016-01-03 MED ORDER — SODIUM CHLORIDE 0.9% FLUSH: 3.0000 mL | INTRAVENOUS | Status: DC | PRN

## 2016-01-03 MED ORDER — SODIUM CHLORIDE 0.9 % IV SOLN: Freq: Once | INTRAVENOUS | Status: DC

## 2016-01-03 MED ORDER — MAGNESIUM HYDROXIDE 400 MG/5ML PO SUSP: 30.0000 mL | Freq: Every day | ORAL | Status: DC | PRN

## 2016-01-03 MED ORDER — METOCLOPRAMIDE HCL 5 MG/ML IJ SOLN
10.0000 mg | Freq: Three times a day (TID) | INTRAMUSCULAR | Status: AC
  Administered 2016-01-03 (×3): 10 mg via INTRAVENOUS
  Filled 2016-01-03 (×2): qty 2

## 2016-01-03 MED ORDER — SODIUM CHLORIDE 0.9 % IV SOLN: 250.0000 mL | INTRAVENOUS | Status: DC | PRN

## 2016-01-03 MED ORDER — MOVING RIGHT ALONG BOOK
Freq: Once | Status: AC
  Administered 2016-01-03: 15:00:00
  Filled 2016-01-03: qty 1

## 2016-01-03 MED ORDER — SODIUM CHLORIDE 0.9% FLUSH
3.0000 mL | Freq: Two times a day (BID) | INTRAVENOUS | Status: DC
  Administered 2016-01-03 – 2016-01-05 (×4): 3 mL via INTRAVENOUS

## 2016-01-03 MED ORDER — ZOLPIDEM TARTRATE 5 MG PO TABS: 5.0000 mg | ORAL_TABLET | Freq: Every evening | ORAL | Status: DC | PRN

## 2016-01-03 MED ORDER — ALUM & MAG HYDROXIDE-SIMETH 200-200-20 MG/5ML PO SUSP: 15.0000 mL | ORAL | Status: DC | PRN

## 2016-01-03 MED ORDER — SODIUM CHLORIDE 0.9 % IV SOLN: Freq: Once | INTRAVENOUS | Status: AC

## 2016-01-03 MED ORDER — FUROSEMIDE 10 MG/ML IJ SOLN
20.0000 mg | Freq: Once | INTRAMUSCULAR | Status: AC
  Administered 2016-01-03: 20 mg via INTRAVENOUS
  Filled 2016-01-03: qty 2

## 2016-01-03 MED ORDER — ISOSORBIDE MONONITRATE ER 30 MG PO TB24
15.0000 mg | ORAL_TABLET | Freq: Every day | ORAL | Status: DC
  Administered 2016-01-04 – 2016-01-05 (×2): 15 mg via ORAL
  Filled 2016-01-03 (×2): qty 1

## 2016-01-03 MED ORDER — INSULIN ASPART 100 UNIT/ML ~~LOC~~ SOLN
0.0000 [IU] | Freq: Three times a day (TID) | SUBCUTANEOUS | Status: DC
  Administered 2016-01-03: 3 [IU] via SUBCUTANEOUS
  Administered 2016-01-03: 2 [IU] via SUBCUTANEOUS
  Administered 2016-01-04: 3 [IU] via SUBCUTANEOUS
  Administered 2016-01-04 (×2): 2 [IU] via SUBCUTANEOUS
  Administered 2016-01-05 (×2): 3 [IU] via SUBCUTANEOUS

## 2016-01-03 NOTE — Anesthesia Postprocedure Evaluation (Signed)
Anesthesia Post Note  Patient: Kurt Mathis  Procedure(s) Performed: Procedure(s) (LRB): CORONARY ARTERY BYPASS GRAFTING (CABG), ON PUMP, TIMES 5, USING LEFT RADIAL ARTERY AND LEFT INTERNAL MAMMARY ARTERY, BILATERAL GREATER SAPHENOUS VEINS HARVESTED ENDOSCOPICALLY (N/A) TRANSESOPHAGEAL ECHOCARDIOGRAM (TEE) (N/A) LEFT RADIAL ARTERY HARVEST (Left)  Patient location during evaluation: ICU Anesthesia Type: General Level of consciousness: sedated Pain management: pain level controlled Vital Signs Assessment: post-procedure vital signs reviewed and stable Respiratory status: patient on ventilator - see flowsheet for VS and patient remains intubated per anesthesia plan Cardiovascular status: stable Anesthetic complications: no    Last Vitals:  Vitals:   01/03/16 1000 01/03/16 1100  BP: 119/71 123/71  Pulse: 87 86  Resp: (!) 23 (!) 23  Temp: 36.8 C 36.8 C    Last Pain:  Vitals:   01/03/16 1123  TempSrc:   PainSc: 0-No pain                 Lonnel Gjerde S

## 2016-01-03 NOTE — Evaluation (Signed)
Clinical/Bedside Swallow Evaluation Patient Details  Name: Kurt Mathis MRN: [id number] Date of Birth: May 23, 1960  Today's Date: 01/03/2016 Time: SLP Start Time (ACUTE ONLY): 0940 SLP Stop Time (ACUTE ONLY): 0955 SLP Time Calculation (min) (ACUTE ONLY): 15 min  Past Medical History:  Past Medical History:  Diagnosis Date  . Cardiomyopathy ([place])   . Coronary artery disease   . Diabetes mellitus without complication ([place])    type 2  . HLD (hyperlipidemia)   . NSTEMI (non-ST elevated myocardial infarction) [facility])    Past Surgical History:  Past Surgical History:  Procedure Laterality Date  . CARDIAC CATHETERIZATION N/A 12/25/2015   Procedure: Left Heart Cath and Coronary Angiography;  Surgeon: Belva Crome, MD;  Location: [place] CV LAB;  Service: Cardiovascular;  Laterality: N/A;  . CARDIAC CATHETERIZATION N/A 12/26/2015   Procedure: Intravascular Pressure Wire/FFR Study;  Surgeon: Burnell Blanks, MD;  Location: [place] CV LAB;  Service: Cardiovascular;  Laterality: N/A;  . CORONARY ARTERY BYPASS GRAFT N/A 01/01/2016   Procedure: CORONARY ARTERY BYPASS GRAFTING (CABG), ON PUMP, TIMES 5, USING LEFT RADIAL ARTERY AND LEFT INTERNAL MAMMARY ARTERY, BILATERAL GREATER SAPHENOUS VEINS HARVESTED ENDOSCOPICALLY;  Surgeon: [name], MD;  Location: [place];  Service: Open Heart Surgery;  Laterality: N/A;  LIMA-LAD; SEQ SVG-RCA-PD; SVG-DIAG; LEFT RADIAL-OM  . FINGER SURGERY Right   . RADIAL ARTERY HARVEST Left 01/01/2016   Procedure: LEFT RADIAL ARTERY HARVEST;  Surgeon: [name], MD;  Location: [place];  Service: Open Heart Surgery;  Laterality: Left;  . TEE WITHOUT CARDIOVERSION N/A 01/01/2016   Procedure: TRANSESOPHAGEAL ECHOCARDIOGRAM (TEE);  Surgeon: [name], MD;  Location: [place];  Service: Open Heart Surgery;  Laterality: N/A;   HPI:  Pt is a 55 year old male admitted for CBG due to CAD on 9/25, remained intubated until 9/26. Started on  diet, no mention of difficulty in chart.    Assessment / Plan / Recommendation Clinical Impression  Pt demonstrates adequate oral and oropharyngeal function on clinical exam, is awake and alert and able to explain his history very clearly in Spanish. He explains that he has had globus with solids all his life and needs to drink more liquids than most people to pass solids and pills. He also reports masticating meats and solids very well. He reports only mild occasional heartburn and denies any cases of expectoration. Overall this sounds like a mild baseline esophageal dysphagia that pt has never discussed with an MD.  GIven that he is alert and cognitively intact and aware of his deficits I anticipate he will make appropriate food choices and manage precautions independently, though function could potentially worsen with bedbound status and need for pain medication. Will follow up tomorrow after pt has attempted solid meals to check for tolerance. Explained that in some cases esophageal dysphagia can be treated, though this would be better addressed as an outpatient after he recovers from this illness if he is able to toelrate meals. Discussed with RN.     Aspiration Risk  Mild aspiration risk    Diet Recommendation Regular;Thin liquid   Liquid Administration via: Cup;Straw Medication Administration: Whole meds with liquid (can try with puree) Supervision: Patient able to self feed;Intermittent supervision to cue for compensatory strategies Compensations: Slow rate;Small sips/bites;Follow solids with liquid Postural Changes: Seated upright at 90 degrees;Remain upright for at least 30 minutes after po intake    Other  Recommendations Recommended Consults: Consider GI evaluation;Consider esophageal assessment Oral Care Recommendations: Oral care  BID   Follow up Recommendations None      Frequency and Duration min 1 x/week  1 week       Prognosis Prognosis for Safe Diet Advancement: Good       Swallow Study   General HPI: Pt is a 55 year old male admitted for CBG due to CAD on 9/25, remained intubated until 9/26. Started on diet, no mention of difficulty in chart.  Type of Study: Bedside Swallow Evaluation Previous Swallow Assessment: none Diet Prior to this Study: Thin liquids Temperature Spikes Noted: No Respiratory Status: Room air History of Recent Intubation: Yes Length of Intubations (days): 2 days Date extubated: 01/02/16 Behavior/Cognition: Alert;Cooperative;Pleasant mood Oral Cavity Assessment: Within Functional Limits Oral Care Completed by SLP: No Oral Cavity - Dentition: Adequate natural dentition Vision: Functional for self-feeding Self-Feeding Abilities: Able to feed self Patient Positioning: Upright in bed Baseline Vocal Quality: Normal Volitional Cough: Weak (poor effort due to pain) Volitional Swallow: Able to elicit    Oral/Motor/Sensory Function Overall Oral Motor/Sensory Function: Within functional limits   Ice Chips     Thin Liquid Thin Liquid: Within functional limits Presentation: Cup;Straw    Nectar Thick Nectar Thick Liquid: Not tested   Honey Thick Honey Thick Liquid: Not tested   Puree Puree: Within functional limits Presentation: Spoon   Solid   GO   Solid: Within functional limits       [facility], MA CCC-SLP [id number]  Lynann Beaver 01/03/2016,10:04 AM

## 2016-01-03 NOTE — Progress Notes (Signed)
2 Days Post-Op Procedure(s) (LRB): CORONARY ARTERY BYPASS GRAFTING (CABG), ON PUMP, TIMES 5, USING LEFT RADIAL ARTERY AND LEFT INTERNAL MAMMARY ARTERY, BILATERAL GREATER SAPHENOUS VEINS HARVESTED ENDOSCOPICALLY (N/A) TRANSESOPHAGEAL ECHOCARDIOGRAM (TEE) (N/A) LEFT RADIAL ARTERY HARVEST (Left) Subjective: C/o incisional pain  Objective: Vital signs in last 24 hours: Temp:  [97.3 F (36.3 C)-99.1 F (37.3 C)] 97.9 F (36.6 C) (09/27 0700) Pulse Rate:  [73-95] 82 (09/27 0700) Cardiac Rhythm: Normal sinus rhythm (09/26 2000) Resp:  [12-25] 20 (09/27 0700) BP: (84-133)/(49-75) 106/68 (09/27 0700) SpO2:  [100 %] 100 % (09/27 0700) Arterial Line BP: (91-166)/(34-70) 114/48 (09/26 1830) Weight:  [171 lb 1.2 oz (77.6 kg)] 171 lb 1.2 oz (77.6 kg) (09/27 0500)  Hemodynamic parameters for last 24 hours: PAP: (20-22)/(5-6) 20/6  Intake/Output from previous day: 09/26 0701 - 09/27 0700 In: 1909.8 [P.O.:150; I.V.:1679.8; Blood:30; IV Piggyback:50] Out: [id number] [Urine:910] Intake/Output this shift: No intake/output data recorded.  General appearance: alert, cooperative and no distress Neurologic: intact Heart: regular rate and rhythm Lungs: diminished breath sounds bibasilar Abdomen: nondistended, hypoactive BS  Lab Results:  Recent Labs  01/03/16 0335 01/03/16 0505  WBC 8.6 8.5  HGB 6.3* 6.3*  HCT 19.5* 19.4*  PLT 176 175   BMET:  Recent Labs  01/02/16 0355  01/02/16 1546 01/03/16 0335  NA 138  --  138 137  K 4.3  --  4.7 4.4  CL 109  --  101 105  CO2 23  --   --  26  GLUCOSE 135*  --  128* 130*  BUN 8  --  8 10  CREATININE 0.66  < > 0.50* 0.66  CALCIUM 7.8*  --   --  8.1*  < > = values in this interval not displayed.  PT/INR:  Recent Labs  01/01/16 2230  LABPROT 21.5*  INR 1.84   ABG    Component Value Date/Time   PHART 7.316 (L) 01/02/2016 0510   HCO3 21.8 01/02/2016 0510   TCO2 23 01/02/2016 1546   ACIDBASEDEF 4.0 (H) 01/02/2016 0510   O2SAT 99.0  01/02/2016 0510   CBG (last 3)   Recent Labs  01/02/16 2349 01/03/16 0345 01/03/16 0745  GLUCAP 137* 110* 141*    Assessment/Plan: S/P Procedure(s) (LRB): CORONARY ARTERY BYPASS GRAFTING (CABG), ON PUMP, TIMES 5, USING LEFT RADIAL ARTERY AND LEFT INTERNAL MAMMARY ARTERY, BILATERAL GREATER SAPHENOUS VEINS HARVESTED ENDOSCOPICALLY (N/A) TRANSESOPHAGEAL ECHOCARDIOGRAM (TEE) (N/A) LEFT RADIAL ARTERY HARVEST (Left) -  CV- in SR   RESP- continue IS  RENAL- creatinine and lytes ok  ENDO- CBG well controlled  Anemia- secondary to ABL- Hgb down to 6.3- transfuse 2 units of PRBC  SCD + enoxaparin for DVT prophylaxis  Cardiac rehab  Transfer to [facility]   LOS: 11 days    [name] 01/03/2016

## 2016-01-04 ENCOUNTER — Inpatient Hospital Stay (HOSPITAL_COMMUNITY): Payer: Self-pay

## 2016-01-04 LAB — TYPE AND SCREEN
ABO/RH(D): O POS
Antibody Screen: NEGATIVE
UNIT DIVISION: 0
Unit division: 0

## 2016-01-04 LAB — GLUCOSE, CAPILLARY
GLUCOSE-CAPILLARY: 180 mg/dL — AB (ref 65–99)
GLUCOSE-CAPILLARY: 141 mg/dL — AB (ref 65–99)
GLUCOSE-CAPILLARY: 159 mg/dL — AB (ref 65–99)
Glucose-Capillary: 143 mg/dL — ABNORMAL HIGH (ref 65–99)
Glucose-Capillary: 196 mg/dL — ABNORMAL HIGH (ref 65–99)

## 2016-01-04 LAB — CBC
HCT: 27.7 % — ABNORMAL LOW (ref 39.0–52.0)
Hemoglobin: 9.2 g/dL — ABNORMAL LOW (ref 13.0–17.0)
MCH: 29.6 pg (ref 26.0–34.0)
MCHC: 33.2 g/dL (ref 30.0–36.0)
MCV: 89.1 fL (ref 78.0–100.0)
PLATELETS: 234 10*3/uL (ref 150–400)
RBC: 3.11 MIL/uL — AB (ref 4.22–5.81)
RDW: 13.8 % (ref 11.5–15.5)
WBC: 11.8 10*3/uL — ABNORMAL HIGH (ref 4.0–10.5)

## 2016-01-04 MED ORDER — LIVING WELL WITH DIABETES BOOK - IN SPANISH
Freq: Once | Status: DC
  Filled 2016-01-04: qty 1

## 2016-01-04 MED ORDER — METFORMIN HCL 500 MG PO TABS
500.0000 mg | ORAL_TABLET | Freq: Two times a day (BID) | ORAL | Status: DC
  Administered 2016-01-05 (×2): 500 mg via ORAL
  Filled 2016-01-04 (×2): qty 1

## 2016-01-04 NOTE — Progress Notes (Addendum)
      [address]       [place] [address]             [phone]      3 Days Post-Op Procedure(s) (LRB): CORONARY ARTERY BYPASS GRAFTING (CABG), ON PUMP, TIMES 5, USING LEFT RADIAL ARTERY AND LEFT INTERNAL MAMMARY ARTERY, BILATERAL GREATER SAPHENOUS VEINS HARVESTED ENDOSCOPICALLY (N/A) TRANSESOPHAGEAL ECHOCARDIOGRAM (TEE) (N/A) LEFT RADIAL ARTERY HARVEST (Left)   Subjective:  [name] wants to go home today.  He states he feels pretty good.  + ambulation  Objective: Vital signs in last 24 hours: Temp:  [98 F (36.7 C)-98.4 F (36.9 C)] 98.1 F (36.7 C) (09/28 0558) Pulse Rate:  [84-96] 87 (09/28 0558) Cardiac Rhythm: Normal sinus rhythm (09/27 1920) Resp:  [18-25] 18 (09/28 0558) BP: (109-142)/(56-76) 126/74 (09/28 0558) SpO2:  [98 %-100 %] 98 % (09/28 0558) FiO2 (%):  [28 %] 28 % (09/27 1459) Weight:  [169 lb 8 oz (76.9 kg)-169 lb 12.8 oz (77 kg)] 169 lb 8 oz (76.9 kg) (09/28 0500)  Intake/Output from previous day: 09/27 0701 - 09/28 0700 In: [id number] [P.O.:300; I.V.:735; Blood:717] Out: 702 [Urine:700; Stool:2]  General appearance: alert, cooperative and no distress Heart: regular rate and rhythm Lungs: diminished breath sounds bibasilar Abdomen: soft, non-tender; bowel sounds normal; no masses,  no organomegaly Extremities: edema trace Wound: clean and dry  Lab Results:  Recent Labs  01/03/16 0335 01/03/16 0505  WBC 8.6 8.5  HGB 6.3* 6.3*  HCT 19.5* 19.4*  PLT 176 175   BMET:  Recent Labs  01/02/16 0355  01/02/16 1546 01/03/16 0335  NA 138  --  138 137  K 4.3  --  4.7 4.4  CL 109  --  101 105  CO2 23  --   --  26  GLUCOSE 135*  --  128* 130*  BUN 8  --  8 10  CREATININE 0.66  < > 0.50* 0.66  CALCIUM 7.8*  --   --  8.1*  < > = values in this interval not displayed.  PT/INR:  Recent Labs  01/01/16 2230  LABPROT 21.5*  INR 1.84   ABG    Component Value Date/Time   PHART 7.316 (L) 01/02/2016 0510   HCO3 21.8  01/02/2016 0510   TCO2 23 01/02/2016 1546   ACIDBASEDEF 4.0 (H) 01/02/2016 0510   O2SAT 99.0 01/02/2016 0510   CBG (last 3)   Recent Labs  01/03/16 1727 01/03/16 2105 01/04/16 0630  GLUCAP 180* 182* 143*    Assessment/Plan: S/P Procedure(s) (LRB): CORONARY ARTERY BYPASS GRAFTING (CABG), ON PUMP, TIMES 5, USING LEFT RADIAL ARTERY AND LEFT INTERNAL MAMMARY ARTERY, BILATERAL GREATER SAPHENOUS VEINS HARVESTED ENDOSCOPICALLY (N/A) TRANSESOPHAGEAL ECHOCARDIOGRAM (TEE) (N/A) LEFT RADIAL ARTERY HARVEST (Left)  1. CV- NSR- continue Lopressor, Imdur for radial graft 2. Pulm- no acute issues, CXR shows small bilateral effusions 3. Renal- creatinine has been stable, weight remains hypervolemic, continue Lasix 4. Expected post operative loss, Hgb 6.3 yesterday, was given 2 units... No repeat CBC ordered will check 5. Dispo- patient stable, recheck H/H, d/c EPW today... Patient wants to go home... May be ready for d/c in AM if remains stable   LOS: 12 days    Mathis, Kurt 01/04/2016  Patient seen and examined, agree with above Hct= 27 today Home in AM if no issues overnight  Remo Lipps C. Roxan Hockey, MD [facility] [phone]

## 2016-01-04 NOTE — Progress Notes (Signed)
Speech Language Pathology Treatment: Dysphagia  Patient Details Name: Kurt Mathis MRN: [id number] DOB: 25-May-1960 Today's Date: 01/04/2016 Time: 9794-8016 SLP Time Calculation (min) (ACUTE ONLY): 8 min  Assessment / Plan / Recommendation Clinical Impression  Pt demonstrates normal swallow function, tolerated regular meals well. Pt is independent with esophageal precautions, but discussed this again as well. No SLP f/u needed will sign off.    HPI HPI: Pt is a 55 year old male admitted for CBG due to CAD on 9/25, remained intubated until 9/26. Started on diet, no mention of difficulty in chart.       SLP Plan  All goals met     Recommendations  Diet recommendations: Regular;Thin liquid Liquids provided via: Cup;Straw Medication Administration: Whole meds with liquid Supervision: Patient able to self feed Compensations: Slow rate;Small sips/bites;Follow solids with liquid Postural Changes and/or Swallow Maneuvers: Seated upright 90 degrees;Upright 30-60 min after meal                Plan: All goals met       GO               Herbie Baltimore, MA CCC-SLP [phone]  Kurt Mathis 01/04/2016, 11:55 AM

## 2016-01-04 NOTE — Progress Notes (Signed)
CARDIAC REHAB PHASE I   PRE:  Rate/Rhythm: 88 SR  BP:  Sitting: 118/78       SaO2: 98% RA  MODE:  Ambulation: 400 ft   POST:  Rate/Rhythm: 91 SR  BP:  Sitting: 124/76        SaO2: 98% RA  Pt was eager to walk and ambulated 400 ft w/o assistance.   Tolerated well with no c/o.  Pt returned to bed, sitting upright, after walk with call bell with in reach.   [place], MS 01/04/2016 1:46 PM

## 2016-01-04 NOTE — Discharge Summary (Signed)
Physician Discharge Summary  Patient ID: Kurt Mathis MRN: [id number] DOB/AGE: 01/01/1961 55 y.o.  Admit date: 12/23/2015 Discharge date: 01/05/2016  Admission Diagnoses:  Patient Active Problem List   Diagnosis Date Noted  . Cardiomyopathy, ischemic   . Coronary artery disease involving native coronary artery of native heart without angina pectoris   . HLD (hyperlipidemia)   . Diabetes mellitus type 2, uncontrolled, with complications ([place]) 27/09/2374  . Essential hypertension   . NSTEMI (non-ST elevated myocardial infarction) ([place]) 12/23/2015  . Diabetes mellitus type 2 in nonobese [facility]) 12/23/2015   Discharge Diagnoses:   Patient Active Problem List   Diagnosis Date Noted  . 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 Cardiomyopathy, ischemic   . Coronary artery disease involving native coronary artery of native heart without angina pectoris   . HLD (hyperlipidemia)   . Diabetes mellitus type 2, uncontrolled, with complications ([place]) 28/31/5176  . Essential hypertension   . NSTEMI (non-ST elevated myocardial infarction) ([place]) 12/23/2015  . Diabetes mellitus type 2 in nonobese [facility]) 12/23/2015   Discharged Condition: good  History of Present Illness:  [name] is a 55 yo hispanic male with no known medical history.  He presented with a 5 day history of chest pain with radiation to the throat.  This typically was associated with ingestion of cold liquids or cold air.  The patient again developed on Friday 12/22/2015 and became severe.  He presented to the ED for evaluation.  He was given NTG with resolution of chest pain.  EKG was obtained and showed lateral T wave changes.  His troponin levels were also elevated and he was ruled in for an MI.  He was admitted for further care. The patient remained stable over the weekend.  He was taken for cardiac catheterization on 12/25/2015.  He was found to have severe 2 vessel CAD, which was felt would be better served with CABG.  TCTS  consult was obtained and Dr. Roxan Hockey was in agreement the patient would benefit from surgical intervention.  The risks and benefits of the procedure were explained to the patient and he was agreeable to proceed.    Hospital Course:   The patient presented to [facility] on 01[facility]01/2016.  He was taken to the operating room and underwent CABG x 5 utilizing LIMA to LAD, Sequential SVG to RCA and PDA, SVG to Diagonal, and Left Radial to OM1.  The patient underwent open radial artery harvest, as well as bilateral greater saphenous vein harvested endoscopically.  He tolerated the procedure without difficulty.  During his stay in the SICU the patient was weaned and extubated.  His chest tubes and arterial lines were removed without difficulty.  He developed expected post operative blood loss anemia with a Hgb of 6.3.  He was transfused 2 units of packed cells.  He was evaluated by SLP for dysphagia.  However evaluation revealed patient has a long standing history of swallowing difficulties and has made compensations for this his entire life.  He was felt to be low risk for aspiration.  He was maintaining NSR and felt stable for transfer to the telemetry unit in stable condition.  The patient continues to make progress.  He continues to maintain NSR and his pacing wires were removed without difficulty.  He was hypertensive and started on an ACE for BP control.  On admission the patient was found to have an elevated fast blood glucose level.  His A1c is 11.7.  He is clearly diabetic and will  require close monitoring of this to preserve his bypass grafts.  Diabetes education was completed.  He will be started on low dose Metformin and instructed to follow up with the internal medicine clinic.  He continues to ambulate independently.  He is tolerating a diabetic diet.  His pain is well controlled.  He is felt medically stable for discharge home today.         Significant Diagnostic Studies: angiography:     Mid RCA lesion, 60 %stenosed.  Dist RCA lesion, 85 %stenosed.  RPDA lesion, 75 %stenosed.  3rd RPLB lesion, 100 %stenosed.  Mid LAD to Dist LAD lesion, 65 %stenosed.  Ost 1st Diag to 1st Diag lesion, 75 %stenosed.  1st Diag lesion, 95 %stenosed.  Ramus lesion, 95 %stenosed.  Prox Cx to Mid Cx lesion, 100 %stenosed.  The left ventricular ejection fraction is 45-50% by visual estimate.    Severe diffuse multivessel coronary disease including total occlusion of the proximal circumflex (feels left to left and right-to-left by collaterals), severe multifocal disease throughout the mid, distal, and continuation of the RCA.  LAD is diffusely diseased with up to 60-70% stenosis in the midsegment. A large diagonal also has segmental severe stenosis.  Left ventricular systolic dysfunction with anterior wall hypokinesis. EF 40-50%.  Treatments: surgery:   CORONARY ARTERY BYPASS GRAFTING (CABG) x 5             LIMA to LAD             SEQ SVG to RCA and PDA             SVG to DIAGONAL             LEFT RADIAL to OM1 TRANSESOPHAGEAL ECHOCARDIOGRAM (TEE) BILATERAL GREATER SAPHENOUS VEINS HARVESTED ENDOSCOPICALLY ([facility])  Disposition: 01-Home or Self Care   Discharge Medications:  The patient has been discharged on:   1.Beta Blocker:  Yes [ x  ]                              No   [   ]                              If No, reason:  2.Ace Inhibitor/ARB: Yes [ x  ]                                     No  [    ]                                     If No, reason:  3.Statin:   Yes [x   ]                  No  [   ]                  If No, reason:  4.Ecasa:  Yes  [  x ]                  No   [   ]                  If No, reason:     Discharge Instructions  Amb Referral to Cardiac Rehabilitation    Complete by:  As directed    Diagnosis:  CABG   CABG X ___:  5       Medication List    STOP taking these medications   acetaminophen-codeine 300-60 MG tablet Commonly  known as:  TYLENOL #4   traMADol 50 MG tablet Commonly known as:  ULTRAM     TAKE these medications   acetaminophen 500 MG tablet Commonly known as:  TYLENOL Take 2 tablets (1,000 mg total) by mouth every 6 (six) hours as needed for mild pain or fever.   aspirin 325 MG EC tablet Take 1 tablet (325 mg total) by mouth daily.   atorvastatin 40 MG tablet Commonly known as:  LIPITOR Take 1 tablet (40 mg total) by mouth daily at 6 PM.   blood glucose meter kit and supplies Dispense based on patient and insurance preference. Use up to four times daily as directed. (FOR ICD-9 250.00, 250.01).   cyclobenzaprine 10 MG tablet Commonly known as:  FLEXERIL Take 10 mg by mouth at bedtime.   furosemide 40 MG tablet Commonly known as:  LASIX Take 1 tablet (40 mg total) by mouth daily. For 7 Days   gabapentin 300 MG capsule Commonly known as:  NEURONTIN Take 300 mg by mouth 3 (three) times daily.   isosorbide mononitrate 30 MG 24 hr tablet Commonly known as:  IMDUR Take 0.5 tablets (15 mg total) by mouth daily. For 30 days   lisinopril 5 MG tablet Commonly known as:  PRINIVIL,ZESTRIL Take 1 tablet (5 mg total) by mouth daily.   meloxicam 15 MG tablet Commonly known as:  MOBIC Take 15 mg by mouth daily.   metFORMIN 500 MG tablet Commonly known as:  GLUCOPHAGE Take 1 tablet (500 mg total) by mouth 2 (two) times daily with a meal.   metoprolol tartrate 25 MG tablet Commonly known as:  LOPRESSOR Take 0.5 tablets (12.5 mg total) by mouth 2 (two) times daily.   oxyCODONE 5 MG immediate release tablet Commonly known as:  Oxy IR/ROXICODONE Take 1-2 tablets (5-10 mg total) by mouth every 3 (three) hours as needed for severe pain.   Potassium Chloride ER 20 MEQ Tbcr Take 20 mEq by mouth daily. For 7 Days      Follow-up [facility] .   Why:  I left a message to have your appointment rescheduled.  If you dont hear from them by middle of next  week, please call to set up appointment  Contact information: [address] [place] [phone]       [name], MD Follow up on 02/06/2016.   Specialty:  Cardiothoracic Surgery Why:  Appointment is at 10:30, please get CXR at 10:00 at [place] located on the first floor of our office  Contact information: [address] [place] [address] [phone]           Signed: Ellwood Handler 01/05/2016, 2:03 PM

## 2016-01-04 NOTE — Progress Notes (Signed)
01/04/2016 9:42 AM EPW D/C'd per order and per protocol.  Ends intact. Pt. Tolerated well.  Advised bedrest x1hr.  Call bell in reach.  Vital signs collected per protocol. [place]

## 2016-01-05 LAB — GLUCOSE, CAPILLARY
GLUCOSE-CAPILLARY: 158 mg/dL — AB (ref 65–99)
Glucose-Capillary: 172 mg/dL — ABNORMAL HIGH (ref 65–99)

## 2016-01-05 MED ORDER — ATORVASTATIN CALCIUM 40 MG PO TABS: 40.0000 mg | ORAL_TABLET | Freq: Every day | ORAL | 3 refills | Status: DC

## 2016-01-05 MED ORDER — ATORVASTATIN CALCIUM 40 MG PO TABS: 40.0000 mg | ORAL_TABLET | Freq: Every day | ORAL | Status: DC

## 2016-01-05 MED ORDER — LISINOPRIL 5 MG PO TABS: 5.0000 mg | ORAL_TABLET | Freq: Every day | ORAL | 3 refills | Status: DC

## 2016-01-05 MED ORDER — METFORMIN HCL 500 MG PO TABS: 500.0000 mg | ORAL_TABLET | Freq: Two times a day (BID) | ORAL | 3 refills | Status: DC

## 2016-01-05 MED ORDER — BLOOD GLUCOSE METER KIT: PACK | 0 refills | Status: DC

## 2016-01-05 MED ORDER — LISINOPRIL 5 MG PO TABS
5.0000 mg | ORAL_TABLET | Freq: Every day | ORAL | Status: DC
  Administered 2016-01-05: 5 mg via ORAL
  Filled 2016-01-05: qty 1

## 2016-01-05 MED ORDER — ACETAMINOPHEN 500 MG PO TABS: 1000.0000 mg | ORAL_TABLET | Freq: Four times a day (QID) | ORAL | 0 refills | Status: DC | PRN

## 2016-01-05 MED ORDER — POTASSIUM CHLORIDE ER 20 MEQ PO TBCR: 20.0000 meq | EXTENDED_RELEASE_TABLET | Freq: Every day | ORAL | 0 refills | Status: DC

## 2016-01-05 MED ORDER — METOPROLOL TARTRATE 25 MG PO TABS: 12.5000 mg | ORAL_TABLET | Freq: Two times a day (BID) | ORAL | 3 refills | Status: DC

## 2016-01-05 MED ORDER — ISOSORBIDE MONONITRATE ER 30 MG PO TB24: 15.0000 mg | ORAL_TABLET | Freq: Every day | ORAL | 0 refills | Status: DC

## 2016-01-05 MED ORDER — OXYCODONE HCL 5 MG PO TABS: 5.0000 mg | ORAL_TABLET | ORAL | 0 refills | Status: DC | PRN

## 2016-01-05 MED ORDER — FUROSEMIDE 40 MG PO TABS: 40.0000 mg | ORAL_TABLET | Freq: Every day | ORAL | 0 refills | Status: DC

## 2016-01-05 MED ORDER — ASPIRIN 325 MG PO TBEC: 325.0000 mg | DELAYED_RELEASE_TABLET | Freq: Every day | ORAL | 0 refills | Status: DC

## 2016-01-05 MED ORDER — ATORVASTATIN CALCIUM 80 MG PO TABS: 80.0000 mg | ORAL_TABLET | Freq: Every day | ORAL | 3 refills | Status: DC

## 2016-01-05 NOTE — Progress Notes (Signed)
Kurt Mathis to be D/C'd home per MD order.  Discussed prescriptions and follow up appointments with the patient. Prescriptions given to patient, medication list explained in detail. Pt verbalized understanding. Sutures removed upon discharge and steri-strips placed. No complications.      Medication List    STOP taking these medications   acetaminophen-codeine 300-60 MG tablet Commonly known as:  TYLENOL #4   traMADol 50 MG tablet Commonly known as:  ULTRAM     TAKE these medications   acetaminophen 500 MG tablet Commonly known as:  TYLENOL Take 2 tablets (1,000 mg total) by mouth every 6 (six) hours as needed for mild pain or fever.   aspirin 325 MG EC tablet Take 1 tablet (325 mg total) by mouth daily.   atorvastatin 40 MG tablet Commonly known as:  LIPITOR Take 1 tablet (40 mg total) by mouth daily at 6 PM.   blood glucose meter kit and supplies Dispense based on patient and insurance preference. Use up to four times daily as directed. (FOR ICD-9 250.00, 250.01).   cyclobenzaprine 10 MG tablet Commonly known as:  FLEXERIL Take 10 mg by mouth at bedtime.   furosemide 40 MG tablet Commonly known as:  LASIX Take 1 tablet (40 mg total) by mouth daily. For 7 Days   gabapentin 300 MG capsule Commonly known as:  NEURONTIN Take 300 mg by mouth 3 (three) times daily.   isosorbide mononitrate 30 MG 24 hr tablet Commonly known as:  IMDUR Take 0.5 tablets (15 mg total) by mouth daily. For 30 days   lisinopril 5 MG tablet Commonly known as:  PRINIVIL,ZESTRIL Take 1 tablet (5 mg total) by mouth daily.   meloxicam 15 MG tablet Commonly known as:  MOBIC Take 15 mg by mouth daily.   metFORMIN 500 MG tablet Commonly known as:  GLUCOPHAGE Take 1 tablet (500 mg total) by mouth 2 (two) times daily with a meal.   metoprolol tartrate 25 MG tablet Commonly known as:  LOPRESSOR Take 0.5 tablets (12.5 mg total) by mouth 2 (two) times daily.   oxyCODONE 5 MG immediate  release tablet Commonly known as:  Oxy IR/ROXICODONE Take 1-2 tablets (5-10 mg total) by mouth every 3 (three) hours as needed for severe pain.   Potassium Chloride ER 20 MEQ Tbcr Take 20 mEq by mouth daily. For 7 Days       Vitals:   01/05/16 1111 01/05/16 1416  BP: (!) 152/78 (!) 150/79  Pulse: 96 91  Resp:  20  Temp:  98.2 F (36.8 C)    Skin clean, dry and intact without evidence of skin break down, no evidence of skin tears noted. IV catheter discontinued intact. Site without signs and symptoms of complications. Dressing and pressure applied. Pt denies pain at this time. No complaints noted.  An After Visit Summary was printed and given to the patient. Patient escorted via [place], and D/C home via private auto.  Dixie Dials BSN, RN

## 2016-01-05 NOTE — Progress Notes (Signed)
      [address]       [place] [address]             [phone]      4 Days Post-Op Procedure(s) (LRB): CORONARY ARTERY BYPASS GRAFTING (CABG), ON PUMP, TIMES 5, USING LEFT RADIAL ARTERY AND LEFT INTERNAL MAMMARY ARTERY, BILATERAL GREATER SAPHENOUS VEINS HARVESTED ENDOSCOPICALLY (N/A) TRANSESOPHAGEAL ECHOCARDIOGRAM (TEE) (N/A) LEFT RADIAL ARTERY HARVEST (Left)   Subjective:  [name] has no complaints.  He is feeling pretty good and wants to go home.  + ambulation independently without difficulty.  Objective: Vital signs in last 24 hours: Temp:  [97.9 F (36.6 C)-98.3 F (36.8 C)] 98.1 F (36.7 C) (09/29 0500) Pulse Rate:  [78-98] 92 (09/29 0500) Cardiac Rhythm: Normal sinus rhythm (09/28 1940) Resp:  [18-20] 20 (09/28 2035) BP: (116-155)/(59-81) 151/81 (09/29 0500) SpO2:  [93 %-100 %] 93 % (09/29 0500) Weight:  [169 lb (76.7 kg)] 169 lb (76.7 kg) (09/29 0500)  General appearance: alert, cooperative and no distress Heart: regular rate and rhythm Lungs: clear to auscultation bilaterally Abdomen: soft, non-tender; bowel sounds normal; no masses,  no organomegaly Extremities: edema mild1+ Wound: clean and ry  Lab Results:  Recent Labs  01/03/16 0505 01/04/16 1056  WBC 8.5 11.8*  HGB 6.3* 9.2*  HCT 19.4* 27.7*  PLT 175 234   BMET:  Recent Labs  01/02/16 1546 01/03/16 0335  NA 138 137  K 4.7 4.4  CL 101 105  CO2  --  26  GLUCOSE 128* 130*  BUN 8 10  CREATININE 0.50* 0.66  CALCIUM  --  8.1*    PT/INR: No results for input(s): LABPROT, INR in the last 72 hours. ABG    Component Value Date/Time   PHART 7.316 (L) 01/02/2016 0510   HCO3 21.8 01/02/2016 0510   TCO2 23 01/02/2016 1546   ACIDBASEDEF 4.0 (H) 01/02/2016 0510   O2SAT 99.0 01/02/2016 0510   CBG (last 3)   Recent Labs  01/04/16 1631 01/04/16 2134 01/05/16 0645  GLUCAP 141* 159* 172*    Assessment/Plan: S/P Procedure(s) (LRB): CORONARY ARTERY BYPASS  GRAFTING (CABG), ON PUMP, TIMES 5, USING LEFT RADIAL ARTERY AND LEFT INTERNAL MAMMARY ARTERY, BILATERAL GREATER SAPHENOUS VEINS HARVESTED ENDOSCOPICALLY (N/A) TRANSESOPHAGEAL ECHOCARDIOGRAM (TEE) (N/A) LEFT RADIAL ARTERY HARVEST (Left)  1. CV- NSR- will continue Lopressor, add ACE for HTN 2. Pulm- no acute issues, off oxygen, continue IS 3. Renal- remains hypervolemic, + pitting edema of LE on exam... Will give a week of lasix at discharge 4. Expected post operative blood loss anemia- improved at 9.3 after transfusion 5. DM- sugars a little better controlled, will start Metformin at 500 mg BID, instructed patients wife to keep a log of sugars prior to appointment with internal medicine for adequate medication adjustments 6. Dispo- patient stable, will plan to d/c home today... Asking for nutrition consult for diabetes education.. Will plan for outpatient referral.    Will ask CM for medication assistance   LOS: 13 days    Swayze Kozuch 01/05/2016

## 2016-01-05 NOTE — Discharge Instructions (Signed)
Coronary Artery Bypass Grafting, Care After °Refer to this sheet in the next few weeks. These instructions provide you with information on caring for yourself after your procedure. Your health care provider may also give you more specific instructions. Your treatment has been planned according to current medical practices, but problems sometimes occur. Call your health care provider if you have any problems or questions after your procedure. °WHAT TO EXPECT AFTER THE PROCEDURE °Recovery from surgery will be different for everyone. Some people feel well after 3 or 4 weeks, while for others it takes longer. After your procedure, it is typical to have the following: °· Nausea and a lack of appetite.   °· Constipation. °· Weakness and fatigue.   °· Depression or irritability.   °· Pain or discomfort at your incision site. °HOME CARE INSTRUCTIONS °· Take medicines only as directed by your health care provider. Do not stop taking medicines or start any new medicines without first checking with your health care provider. °· Take your pulse as directed by your health care provider. °· Perform deep breathing as directed by your health care provider. If you were given a device called an incentive spirometer, use it to practice deep breathing several times a day. Support your chest with a pillow or your arms when you take deep breaths or cough. °· Keep incision areas clean, dry, and protected. Remove or change any bandages (dressings) only as directed by your health care provider. You may have skin adhesive strips over the incision areas. Do not take the strips off. They will fall off on their own. °· Check incision areas daily for any swelling, redness, or drainage. °· If incisions were made in your legs, do the following: °¨ Avoid crossing your legs.   °¨ Avoid sitting for long periods of time. Change positions every 30 minutes.   °¨ Elevate your legs when you are sitting. °· Wear compression stockings as directed by your  health care provider. These stockings help keep blood clots from forming in your legs. °· Take showers once your health care provider approves. Until then, only take sponge baths. Pat incisions dry. Do not rub incisions with a washcloth or towel. Do not take baths, swim, or use a hot tub until your health care provider approves. °· Eat foods that are high in fiber, such as raw fruits and vegetables, whole grains, beans, and nuts. Meats should be lean cut. Avoid canned, processed, and fried foods. °· Drink enough fluid to keep your urine clear or pale yellow. °· Weigh yourself every day. This helps identify if you are retaining fluid that may make your heart and lungs work harder. °· Rest and limit activity as directed by your health care provider. You may be instructed to: °¨ Stop any activity at once if you have chest pain, shortness of breath, irregular heartbeats, or dizziness. Get help right away if you have any of these symptoms. °¨ Move around frequently for short periods or take short walks as directed by your health care provider. Increase your activities gradually. You may need physical therapy or cardiac rehabilitation to help strengthen your muscles and build your endurance. °¨ Avoid lifting, pushing, or pulling anything heavier than 10 lb (4.5 kg) for at least 6 weeks after surgery. °· Do not drive until your health care provider approves.  °· Ask your health care provider when you may return to work. °· Ask your health care provider when you may resume sexual activity. °· Keep all follow-up visits as directed by your health care   provider. This is important. °SEEK MEDICAL CARE IF: °· You have swelling, redness, increasing pain, or drainage at the site of an incision. °· You have a fever. °· You have swelling in your ankles or legs. °· You have pain in your legs.   °· You gain 2 or more pounds (0.9 kg) a day. °· You are nauseous or vomit. °· You have diarrhea.  °SEEK IMMEDIATE MEDICAL CARE IF: °· You have  chest pain that goes to your jaw or arms. °· You have shortness of breath.   °· You have a fast or irregular heartbeat.   °· You notice a "clicking" in your breastbone (sternum) when you move.   °· You have numbness or weakness in your arms or legs. °· You feel dizzy or light-headed.   °MAKE SURE YOU: °· Understand these instructions. °· Will watch your condition. °· Will get help right away if you are not doing well or get worse. °  °This information is not intended to replace advice given to you by your health care provider. Make sure you discuss any questions you have with your health care provider. °  °Document Released: 10/12/2004 Document Revised: 04/15/2014 Document Reviewed: 09/01/2012 °Elsevier Interactive Patient Education ©2016 Elsevier Inc. ° °Endoscopic Saphenous Vein Harvesting, Care After °Refer to this sheet in the next few weeks. These instructions provide you with information on caring for yourself after your procedure. Your health care provider may also give you more specific instructions. Your treatment has been planned according to current medical practices, but problems sometimes occur. Call your health care provider if you have any problems or questions after your procedure. °HOME CARE INSTRUCTIONS °Medicine °· Take whatever pain medicine your surgeon prescribes. Follow the directions carefully. Do not take over-the-counter pain medicine unless your surgeon says it is okay. Some pain medicine can cause bleeding problems for several weeks after surgery. °· Follow your surgeon's instructions about driving. You will probably not be permitted to drive after heart surgery. °· Take any medicines your surgeon prescribes. Any medicines you took before your heart surgery should be checked with your health care provider before you start taking them again. °Wound care °· If your surgeon has prescribed an elastic bandage or stocking, ask how long you should wear it. °· Check the area around your surgical  cuts (incisions) whenever your bandages (dressings) are changed. Look for any redness or swelling. °· You will need to return to have the stitches (sutures) or staples taken out. Ask your surgeon when to do that. °· Ask your surgeon when you can shower or bathe. °Activity °· Try to keep your legs raised when you are sitting. °· Do any exercises your health care providers have given you. These may include deep breathing exercises, coughing, walking, or other exercises. °SEEK MEDICAL CARE IF: °· You have any questions about your medicines. °· You have more leg pain, especially if your pain medicine stops working. °· New or growing bruises develop on your leg. °· Your leg swells, feels tight, or becomes red. °· You have numbness in your leg. °SEEK IMMEDIATE MEDICAL CARE IF: °· Your pain gets much worse. °· Blood or fluid leaks from any of the incisions. °· Your incisions become warm, swollen, or red. °· You have chest pain. °· You have trouble breathing. °· You have a fever. °· You have more pain near your leg incision. °MAKE SURE YOU: °· Understand these instructions. °· Will watch your condition. °· Will get help right away if you are not doing well or   get worse.   This information is not intended to replace advice given to you by your health care provider. Make sure you discuss any questions you have with your health care provider.   Document Released: 12/05/2010 Document Revised: 04/15/2014 Document Reviewed: 12/05/2010 Elsevier Interactive Patient Education 2016 [place] Carbohydrate Counting for Diabetes Mellitus Carbohydrate counting is a method for keeping track of the amount of carbohydrates you eat. Eating carbohydrates naturally increases the level of sugar (glucose) in your blood, so it is important for you to know the amount that is okay for you to have in every meal. Carbohydrate counting helps keep the level of glucose in your blood within normal limits. The amount of carbohydrates  allowed is different for every person. A dietitian can help you calculate the amount that is right for you. Once you know the amount of carbohydrates you can have, you can count the carbohydrates in the foods you want to eat. Carbohydrates are found in the following foods:  Grains, such as breads and cereals.  Dried beans and soy products.  Starchy vegetables, such as potatoes, peas, and corn.  Fruit and fruit juices.  Milk and yogurt.  Sweets and snack foods, such as cake, cookies, candy, chips, soft drinks, and fruit drinks. CARBOHYDRATE COUNTING There are two ways to count the carbohydrates in your food. You can use either of the methods or a combination of both. Reading the "Nutrition Facts" on [place] The "Nutrition Facts" is an area that is included on the labels of almost all packaged food and beverages in the [place]. It includes the serving size of that food or beverage and information about the nutrients in each serving of the food, including the grams (g) of carbohydrate per serving.  Decide the number of servings of this food or beverage that you will be able to eat or drink. Multiply that number of servings by the number of grams of carbohydrate that is listed on the label for that serving. The total will be the amount of carbohydrates you will be having when you eat or drink this food or beverage. Learning Standard Serving Sizes of Food When you eat food that is not packaged or does not include "Nutrition Facts" on the label, you need to measure the servings in order to count the amount of carbohydrates.A serving of most carbohydrate-rich foods contains about 15 g of carbohydrates. The following list includes serving sizes of carbohydrate-rich foods that provide 15 g ofcarbohydrate per serving:   1 slice of bread (1 oz) or 1 six-inch tortilla.    of a hamburger bun or English muffin.  4-6 crackers.   cup unsweetened dry cereal.    cup hot cereal.    cup rice or pasta.    cup mashed potatoes or  of a large baked potato.  1 cup fresh fruit or one small piece of fruit.    cup canned or frozen fruit or fruit juice.  1 cup milk.   cup plain fat-free yogurt or yogurt sweetened with artificial sweeteners.   cup cooked dried beans or starchy vegetable, such as peas, corn, or potatoes.  Decide the number of standard-size servings that you will eat. Multiply that number of servings by 15 (the grams of carbohydrates in that serving). For example, if you eat 2 cups of strawberries, you will have eaten 2 servings and 30 g of carbohydrates (2 servings x 15 g = 30 g). For foods such as soups and casseroles,  in which more than one food is mixed in, you will need to count the carbohydrates in each food that is included. EXAMPLE OF CARBOHYDRATE COUNTING Sample Dinner  3 oz chicken breast.   cup of brown rice.   cup of corn.  1 cup milk.   1 cup strawberries with sugar-free whipped topping.  Carbohydrate Calculation Step 1: Identify the foods that contain carbohydrates:   Rice.   Corn.   Milk.   Strawberries. Step 2:Calculate the number of servings eaten of each:   2 servings of rice.   1 serving of corn.   1 serving of milk.   1 serving of strawberries. Step 3: Multiply each of those number of servings by 15 g:   2 servings of rice x 15 g = 30 g.   1 serving of corn x 15 g = 15 g.   1 serving of milk x 15 g = 15 g.   1 serving of strawberries x 15 g = 15 g. Step 4: Add together all of the amounts to find the total grams of carbohydrates eaten: 30 g + 15 g + 15 g + 15 g = 75 g.   This information is not intended to replace advice given to you by your health care provider. Make sure you discuss any questions you have with your health care provider.   Document Released: 03/25/2005 Document Revised: 04/15/2014 Document Reviewed: 02/19/2013 Elsevier Interactive Patient Education 2016 [employer]. Diabetes Mellitus and Food It is important for you to manage your blood sugar (glucose) level. Your blood glucose level can be greatly affected by what you eat. Eating healthier foods in the appropriate amounts throughout the day at about the same time each day will help you control your blood glucose level. It can also help slow or prevent worsening of your diabetes mellitus. Healthy eating may even help you improve the level of your blood pressure and reach or maintain a healthy weight.  General recommendations for healthful eating and cooking habits include:  Eating meals and snacks regularly. Avoid going long periods of time without eating to lose weight.  Eating a diet that consists mainly of plant-based foods, such as fruits, vegetables, nuts, legumes, and whole grains.  Using low-heat cooking methods, such as baking, instead of high-heat cooking methods, such as deep frying. Work with your dietitian to make sure you understand how to use the Nutrition Facts information on food labels. HOW CAN FOOD AFFECT ME? Carbohydrates Carbohydrates affect your blood glucose level more than any other type of food. Your dietitian will help you determine how many carbohydrates to eat at each meal and teach you how to count carbohydrates. Counting carbohydrates is important to keep your blood glucose at a healthy level, especially if you are using insulin or taking certain medicines for diabetes mellitus. Alcohol Alcohol can cause sudden decreases in blood glucose (hypoglycemia), especially if you use insulin or take certain medicines for diabetes mellitus. Hypoglycemia can be a life-threatening condition. Symptoms of hypoglycemia (sleepiness, dizziness, and disorientation) are similar to symptoms of having too much alcohol.  If your health care provider has given you approval to drink alcohol, do so in moderation and use the following guidelines:  Women should not have more than one drink per day, and  men should not have more than two drinks per day. One drink is equal to:  12 oz of beer.  5 oz of wine.  1 oz of hard liquor.  Do not drink  on an empty stomach.  Keep yourself hydrated. Have water, diet soda, or unsweetened iced tea.  Regular soda, juice, and other mixers might contain a lot of carbohydrates and should be counted. WHAT FOODS ARE NOT RECOMMENDED? As you make food choices, it is important to remember that all foods are not the same. Some foods have fewer nutrients per serving than other foods, even though they might have the same number of calories or carbohydrates. It is difficult to get your body what it needs when you eat foods with fewer nutrients. Examples of foods that you should avoid that are high in calories and carbohydrates but low in nutrients include:  Trans fats (most processed foods list trans fats on the Nutrition Facts label).  Regular soda.  Juice.  Candy.  Sweets, such as cake, pie, doughnuts, and cookies.  Fried foods. WHAT FOODS CAN I EAT? Eat nutrient-rich foods, which will nourish your body and keep you healthy. The food you should eat also will depend on several factors, including:  The calories you need.  The medicines you take.  Your weight.  Your blood glucose level.  Your blood pressure level.  Your cholesterol level. You should eat a variety of foods, including:  Protein.  Lean cuts of meat.  Proteins low in saturated fats, such as fish, egg whites, and beans. Avoid processed meats.  Fruits and vegetables.  Fruits and vegetables that may help control blood glucose levels, such as apples, mangoes, and yams.  Dairy products.  Choose fat-free or low-fat dairy products, such as milk, yogurt, and cheese.  Grains, bread, pasta, and rice.  Choose whole grain products, such as multigrain bread, whole oats, and brown rice. These foods may help control blood pressure.  Fats.  Foods containing healthful fats, such as  nuts, avocado, olive oil, canola oil, and fish. DOES EVERYONE WITH DIABETES MELLITUS HAVE THE SAME MEAL PLAN? Because every person with diabetes mellitus is different, there is not one meal plan that works for everyone. It is very important that you meet with a dietitian who will help you create a meal plan that is just right for you.   This information is not intended to replace advice given to you by your health care provider. Make sure you discuss any questions you have with your health care provider.   Document Released: 12/20/2004 Document Revised: 04/15/2014 Document Reviewed: 02/19/2013 Elsevier Interactive Patient Education [employer].

## 2016-01-05 NOTE — Care Management Note (Signed)
Case Management Note Previous CM note initiated by Lacretia Leigh, RN 12/25/2015, 10:40 AM   Patient Details  Name: Kurt Mathis MRN: [id number] Date of Birth: 1961/03/12  Subjective/Objective:  Adm w new dm, nstemi                  Action/Plan:lives w wife and son  Expected Discharge Date:    01/05/16              Expected Discharge Plan:  Home/Self Care  In-House Referral:  Financial Counselor  Discharge [facility], CM Consult, Follow-up appt scheduled, Medication Assistance, [place] [facility]  Post Acute Care Choice:    Choice offered to:     DME Arranged:    DME Agency:     HH Arranged:    [place] Agency:     Status of Service:  Completed, signed off  If discussed at [employer], dates discussed:    Additional Comments:   01/05/16- 1100- Madora Barletta RN, CM- pt for d/c home today with wife- call placed again to IM clinic- msg left for [place] manager- to try to reschedule pt's appointment with clinic -  awaiting return call- pt will also need MATCH assistance for discharge- have given [place] letter to pt and spoken with pts wife over phone to explain programs copay cost of $3 per prescription will have pain med override to help assist with cost as pt is post op CABG- info also given on inexpensive glucose meters and strips at [facility] for pt- do not have access to "free meters" - pt's wife also has asked about FC f/u- will give wife name and phone # to [facility]- to f/u if she has not heard from them after discharge regarding pts medicaid.   01/04/16- 1400- Americo Vallery rN, CM- pt possible d/c tomorrow- call made to IM clinic - [facility] Racer to try to reschedule pts appointment- msg left for [facility] regarding pt- and awaiting return call.    12/27/15- Marvetta Gibbons RN, CM- Pt will need CABG- plan for OR on Monday- Sept. 25- call made to the IM clinic on the ground floor to re-schedule the appointment given for  next week- spoke with Tamela Oddi Solomon-[phone])- will need to call her or send a msg to her when pt ready for discharge post op so that she can re-schedule pt's appointment with clinic. CM to continue to follow   12/26/15- Per [place] Anon- [facility]. They have given pt appt on 01-03-16 at 1:15pm   12/25/15- Per Donne Anon RN, CM-  gave pt inform on [place] [facility].  Have contacted [place] [facility], [facility] med[facility]sickle cell clinic, [facility] and no one taking referrals. [facility] but may be end of October. Have left message w [facility] pediatric clinic to see if they are taking any new pt's. Have not been able to get pt w a pcp clinic .  Marvetta Gibbons [place], RN 01/05/2016, 11:14 AM [phone]  01/05/16- 60- Macallan Ord RN, CM- pt for d/c home today with wife- call placed again to IM clinic- msg left for [place] manager- to try to reschedule pt's appointment with clinic - am awaiting return call- pt will also need MATCH assistance for discharge- have given [place] letter to pt and spoken with pts wife over phone to  explain programs copay cost of $3 per prescription will have pain med override to help assist with cost as pt is post op CABG- pt's wife also has asked about FC f/u- will give wife name and phone # to [facility]- to f/u if she has not heard from them after discharge regarding pts medicaid.   01/04/16- 1400- Merikay Lesniewski rN, CM- pt possible d/c tomorrow- call made to IM clinic - Susette Racer to try to reschedule pts appointment- msg left for [facility] regarding pt- and awaiting return call.

## 2016-01-05 NOTE — Progress Notes (Signed)
Inpatient Diabetes Program Recommendations  AACE/ADA: New Consensus Statement on Inpatient Glycemic Control (2015)  Target Ranges:  Prepandial:   less than 140 mg/dL      Peak postprandial:   less than 180 mg/dL (1-2 hours)      Critically ill patients:  140 - 180 mg/dL   Lab Results  Component Value Date   GLUCAP 158 (H) 01/05/2016   HGBA1C 11.7 (H) 12/24/2015    Review of Glycemic Control  Excellent glycemic control in hospital. To be discharged on metformin 500 mg bid. To f/u with IM clinic for glycemic control management.  OP Diabetes Education has been ordered. IM clinic has RD, CDE for diabetes education.  Thank you. Lorenda Peck, RD, LDN, CDE Inpatient Diabetes Coordinator [phone]

## 2016-01-05 NOTE — Progress Notes (Signed)
Patient Name: Kurt Mathis Date of Encounter: 01/05/2016  Hospital Problem List     Principal Problem:   NSTEMI (non-ST elevated myocardial infarction) [facility]) Active Problems:   Diabetes mellitus type 2, uncontrolled, with complications ([place])   Essential hypertension   HLD (hyperlipidemia)   Coronary artery disease involving native coronary artery of native heart without angina pectoris   Cardiomyopathy, ischemic   S/P CABG x 5    Patient Profile     This is a 55 y.o. male with a past medical history significant for the absence of serious medical problems presented with roughly 5 day history of unstable angina.  Found to have NSTEMI, CAD now status post CABG, DM and HTN.  EF mildly reduced.   Subjective   No distress.  Mild subjective chest soreness.    Inpatient Medications    . acetaminophen  1,000 mg Oral Q6H   Or  . acetaminophen (TYLENOL) oral liquid 160 mg/5 mL  1,000 mg Per Tube Q6H  . aspirin EC  325 mg Oral Daily   Or  . aspirin  324 mg Per Tube Daily  . bisacodyl  10 mg Oral Daily   Or  . bisacodyl  10 mg Rectal Daily  . docusate sodium  200 mg Oral Daily  . enoxaparin (LOVENOX) injection  40 mg Subcutaneous QHS  . gabapentin  300 mg Oral TID  . insulin aspart  0-15 Units Subcutaneous TID WC  . isosorbide mononitrate  15 mg Oral Daily  . lisinopril  5 mg Oral Daily  . living well with diabetes book- in spanish   Does not apply Once  . metFORMIN  500 mg Oral BID WC  . metoprolol tartrate  12.5 mg Oral BID  . pantoprazole  40 mg Oral Daily  . sodium chloride flush  3 mL Intravenous Q12H    Vital Signs    Vitals:   01/04/16 1352 01/04/16 1515 01/04/16 2035 01/05/16 0500  BP: 118/78 (!) 117/59 118/61 (!) 151/81  Pulse:  88 98 92  Resp:  18 20   Temp: 97.9 F (36.6 C) 98 F (36.7 C) 98.3 F (36.8 C) 98.1 F (36.7 C)  TempSrc: Oral Oral Oral Oral  SpO2: 98% 100% 98% 93%  Weight:    169 lb (76.7 kg)  Height:        Intake/Output Summary  (Last 24 hours) at 01/05/16 1007 Last data filed at 01/05/16 0800  Gross per 24 hour  Intake               25 ml  Output                0 ml  Net               25 ml   Filed Weights   01/03/16 1443 01/04/16 0500 01/05/16 0500  Weight: 169 lb 12.8 oz (77 kg) 169 lb 8 oz (76.9 kg) 169 lb (76.7 kg)    Physical Exam    GEN: Well nourished, well developed, in no acute distress.  Neck: Supple, no JVD, carotid bruits, or masses. Cardiac: RRR, no rubs, or gallops. No clubbing, cyanosis, no edema.  Radials/DP/PT 2+ and equal bilaterally.  Respiratory:  Respirations  regular and unlabored, clear to auscultation bilaterally. GI: Soft, nontender, nondistended, BS + x 4. Neuro:  Strength and sensation are intact.   Labs    CBC  Recent Labs  01/03/16 0505 01/04/16 1056  WBC 8.5 11.8*  HGB 6.3*  9.2*  HCT 19.4* 27.7*  MCV 91.5 89.1  PLT 175 [id number]   Basic Metabolic Panel  Recent Labs  01/02/16 1545 01/02/16 1546 01/03/16 0335  NA  --  138 137  K  --  4.7 4.4  CL  --  101 105  CO2  --   --  26  GLUCOSE  --  128* 130*  BUN  --  8 10  CREATININE 0.69 0.50* 0.66  CALCIUM  --   --  8.1*  MG 2.3  --   --       Lab Results  Component Value Date   CHOL 171 12/24/2015   TRIG 261 (H) 12/24/2015   HDL 20 (L) 12/24/2015   LDLCALC 99 12/24/2015   Lab Results  Component Value Date   HGBA1C 11.7 (H) 12/24/2015     Telemetry    NSR, sinus tach  ECG    NA  Radiology    Dg Chest 2 View  Result Date: 01/04/2016 CLINICAL DATA:  Atelectasis. EXAM: CHEST  2 VIEW COMPARISON:  01/03/2016. FINDINGS: Prior CABG. Cardiomegaly with normal pulmonary vascularity. Low lung volumes with bibasilar atelectasis and/or infiltrates/edema. Small bilateral pleural effusions. IMPRESSION: 1.  Prior CABG. 2. Low lung volumes with bibasilar atelectasis and/or infiltrates/edema. Small bilateral pleural effusions. Electronically Signed   By: Marcello Moores  Register   On: 01/04/2016 07:22   Dg Chest 2  View  Result Date: 12/23/2015 CLINICAL DATA:  Tachycardia and chest pain for 3 days. EXAM: CHEST  2 VIEW COMPARISON:  None. FINDINGS: The cardiomediastinal silhouette is unremarkable. There is no evidence of focal airspace disease, pulmonary edema, suspicious pulmonary nodule/mass, pleural effusion, or pneumothorax. No acute bony abnormalities are identified. IMPRESSION: No active cardiopulmonary disease. Electronically Signed   By: Margarette Canada M.D.   On: 12/23/2015 11:50   Dg Chest Port 1 View  Result Date: 01/03/2016 CLINICAL DATA:  55 year old male status post CABG. Initial encounter. EXAM: PORTABLE CHEST 1 VIEW COMPARISON:  01/02/2016 and earlier. FINDINGS: Portable AP semi upright view at 0633 hours. Swan-Ganz catheter removed. Mediastinal tube and left chest tube removed. Right IJ introducer sheath remains. Stable lung volumes. No pneumothorax. No definite pleural effusion. Stable pulmonary vascularity without edema. Streaky retrocardiac opacity likely representing atelectasis. Stable cardiac size and mediastinal contours. IMPRESSION: 1. Lines and tubes removed except for right IJ introducer sheath. No pneumothorax. 2. Stable lung volumes. Continued retrocardiac opacity favored to be atelectasis. Electronically Signed   By: Genevie Ann M.D.   On: 01/03/2016 07:36   Dg Chest Port 1 View  Result Date: 01/02/2016 CLINICAL DATA:  Status post CABG yesterday. EXAM: PORTABLE CHEST 1 VIEW COMPARISON:  Portable chest x-ray of January 01, 2016 FINDINGS: The trachea and esophagus have been extubated. The lungs are adequately inflated minimal left lower lobe atelectasis medially is present. The mediastinal drain and left-sided chest tube are in stable position. There is no pleural effusion or pneumothorax. The heart is top-normal in size. The pulmonary vascularity is not engorged. The Swan-Ganz catheter tip projects over the proximal right main pulmonary artery. The sternal wires are intact. IMPRESSION: Minimal  left lower lobe atelectasis medially. No pleural effusion, pneumothorax, nor pulmonary edema. Interval extubation of the trachea and esophagus. The remaining support tubes are in stable position. Electronically Signed   By: David  Martinique M.D.   On: 01/02/2016 08:26   Dg Chest Port 1 View  Result Date: 01/01/2016 CLINICAL DATA:  Status post CABG EXAM: PORTABLE CHEST 1 VIEW COMPARISON:  12/23/2015 FINDINGS: There is ET tube with tip above the carina. Mediastinal drain is in place. There is a left-sided chest tube. Swan-Ganz catheter tip is in the right pulmonary artery is a gastric tube is identified with side port below the GE junction. Normal heart size. No pleural effusion or edema. No pneumothorax identified. IMPRESSION: 1. Support apparatus positioned as above. 2. Left chest tube in place without evidence for pneumothorax. 3. No CHF. Electronically Signed   By: Kerby Moors M.D.   On: 01/01/2016 23:18    Assessment & Plan    CAD:  CABG .  Needs risk reduction.  I will add a statin.    NSTEMI:  Plan is for Plavix 12 months after the cath.    ISCHEMIC CARDIOMYOPATHY:    On low dose lisinopril and beta blockers.   DM:   Probably long standing and poorly controlled.  Continue current therapy.  Has out patient diabetes education planned.   FOLLOW UP IS SCHEDULED IN OUR OFFICE.   Signed, Minus Breeding, MD  01/05/2016, 10:07 AM

## 2016-01-05 NOTE — Progress Notes (Signed)
Discussed the importance of IS use and sternal precautions with pt and spouse. Pt has a new dx of Dm, discussed the importance of diet and exercise for recovery and Dm.  Will refer to [place].  Pt and spouse verbalized understanding.   [id number]  Cleda Mccreedy, [place] 01/05/2016 12:48pm

## 2016-01-06 ENCOUNTER — Encounter (HOSPITAL_COMMUNITY): Payer: Self-pay | Admitting: Emergency Medicine

## 2016-01-06 ENCOUNTER — Emergency Department (HOSPITAL_COMMUNITY)
Admission: EM | Admit: 2016-01-06 | Discharge: 2016-01-06 | Disposition: A | Discharge status: Home / Self Care | Payer: Self-pay | Attending: Emergency Medicine | Admitting: Emergency Medicine

## 2016-01-06 DIAGNOSIS — Z951 Presence of aortocoronary bypass graft: Secondary | ICD-10-CM | POA: Insufficient documentation

## 2016-01-06 DIAGNOSIS — L24A9 Irritant contact dermatitis due friction or contact with other specified body fluids: Secondary | ICD-10-CM

## 2016-01-06 DIAGNOSIS — Z955 Presence of coronary angioplasty implant and graft: Secondary | ICD-10-CM | POA: Insufficient documentation

## 2016-01-06 DIAGNOSIS — I97190 Other postprocedural cardiac functional disturbances following cardiac surgery: Secondary | ICD-10-CM | POA: Insufficient documentation

## 2016-01-06 DIAGNOSIS — T148XXA Other injury of unspecified body region, initial encounter: Secondary | ICD-10-CM

## 2016-01-06 DIAGNOSIS — Z7982 Long term (current) use of aspirin: Secondary | ICD-10-CM | POA: Insufficient documentation

## 2016-01-06 DIAGNOSIS — E119 Type 2 diabetes mellitus without complications: Secondary | ICD-10-CM | POA: Insufficient documentation

## 2016-01-06 DIAGNOSIS — Z7984 Long term (current) use of oral hypoglycemic drugs: Secondary | ICD-10-CM | POA: Insufficient documentation

## 2016-01-06 DIAGNOSIS — I252 Old myocardial infarction: Secondary | ICD-10-CM | POA: Insufficient documentation

## 2016-01-06 DIAGNOSIS — I2510 Atherosclerotic heart disease of native coronary artery without angina pectoris: Secondary | ICD-10-CM | POA: Insufficient documentation

## 2016-01-06 LAB — BASIC METABOLIC PANEL
ANION GAP: 10 (ref 5–15)
BUN: 10 mg/dL (ref 6–20)
CHLORIDE: 98 mmol/L — AB (ref 101–111)
CO2: 24 mmol/L (ref 22–32)
Calcium: 8.7 mg/dL — ABNORMAL LOW (ref 8.9–10.3)
Creatinine, Ser: 0.62 mg/dL (ref 0.61–1.24)
GFR calc Af Amer: >60 mL/min (ref >60)
GLUCOSE: 156 mg/dL — AB (ref 65–99)
POTASSIUM: 4.2 mmol/L (ref 3.5–5.1)
Sodium: 132 mmol/L — ABNORMAL LOW (ref 135–145)

## 2016-01-06 LAB — CBC WITH DIFFERENTIAL/PLATELET
BASOS ABS: 0 10*3/uL (ref 0.0–0.1)
BASOS PCT: 0 %
Eosinophils Absolute: 0.1 10*3/uL (ref 0.0–0.7)
Eosinophils Relative: 1 %
HEMATOCRIT: 30.8 % — AB (ref 39.0–52.0)
HEMOGLOBIN: 10.2 g/dL — AB (ref 13.0–17.0)
LYMPHS PCT: 27 %
Lymphs Abs: 2.3 10*3/uL (ref 0.7–4.0)
MCH: 30 pg (ref 26.0–34.0)
MCHC: 33.1 g/dL (ref 30.0–36.0)
MCV: 90.6 fL (ref 78.0–100.0)
Monocytes Absolute: 0.9 10*3/uL (ref 0.1–1.0)
Monocytes Relative: 11 %
NEUTROS ABS: 5.1 10*3/uL (ref 1.7–7.7)
NEUTROS PCT: 61 %
Platelets: 402 10*3/uL — ABNORMAL HIGH (ref 150–400)
RBC: 3.4 MIL/uL — AB (ref 4.22–5.81)
RDW: 13.1 % (ref 11.5–15.5)
WBC: 8.4 10*3/uL (ref 4.0–10.5)

## 2016-01-06 NOTE — ED Triage Notes (Signed)
Pt recently discharged from hospital after having open heart surgery. Pt here for check of drainage from surgical area. No drainage noted at present, reports happened overnight.

## 2016-01-06 NOTE — Discharge Instructions (Signed)
As discussed, your evaluation today has been largely reassuring.  But, it is important that you monitor your condition carefully, and do not hesitate to return to the ED if you develop new, or concerning changes in your condition. ? ?Otherwise, please follow-up with your physician for appropriate ongoing care. ? ?

## 2016-01-06 NOTE — ED Notes (Signed)
Gave pt. A chair and assisted in helping him put his feet up.  Kurt Mathis

## 2016-01-06 NOTE — ED Provider Notes (Signed)
[facility] DEPT Provider Note   CSN: [id number] Arrival date & time: 01/06/16  1320     History   Chief Complaint Chief Complaint  Patient presents with  . Post-op Problem  . Wound Check    HPI Kurt Mathis is a 55 y.o. male.  HPI  Patient presents one day after being discharged from this facility, and 7 days after having coronary bypass surgery, now with concern for drainage from surgical site. Patient notes that he has been recovering generally well from his surgery. However, over the past year the patient has noticed drainage from his left upper abdominal surgical incision. No new fever, chills, nausea, vomiting, weakness, chest pain, dyspnea. No pain in the area. The drainage is clear, bloody. Patient has not spoken with his cardiology team.   Past Medical History:  Diagnosis Date  . Cardiomyopathy ([place])   . Coronary artery disease   . Diabetes mellitus without complication ([place])    type 2  . HLD (hyperlipidemia)   . NSTEMI (non-ST elevated myocardial infarction) [facility])     Patient Active Problem List   Diagnosis Date Noted  . 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 Cardiomyopathy, ischemic   . Coronary artery disease involving native coronary artery of native heart without angina pectoris   . HLD (hyperlipidemia)   . Diabetes mellitus type 2, uncontrolled, with complications ([place]) 30/94/0768  . Essential hypertension   . NSTEMI (non-ST elevated myocardial infarction) ([place]) 12/23/2015  . Diabetes mellitus type 2 in nonobese ([place]) 12/23/2015    Past Surgical History:  Procedure Laterality Date  . CARDIAC CATHETERIZATION N/A 12/25/2015   Procedure: Left Heart Cath and Coronary Angiography;  Surgeon: Belva Crome, MD;  Location: [facility] CV LAB;  Service: Cardiovascular;  Laterality: N/A;  . CARDIAC CATHETERIZATION N/A 12/26/2015   Procedure: Intravascular Pressure Wire/FFR Study;  Surgeon: Burnell Blanks, MD;  Location: [place] CV LAB;  Service:  Cardiovascular;  Laterality: N/A;  . CORONARY ARTERY BYPASS GRAFT N/A 01/01/2016   Procedure: CORONARY ARTERY BYPASS GRAFTING (CABG), ON PUMP, TIMES 5, USING LEFT RADIAL ARTERY AND LEFT INTERNAL MAMMARY ARTERY, BILATERAL GREATER SAPHENOUS VEINS HARVESTED ENDOSCOPICALLY;  Surgeon: [name], MD;  Location: [facility];  Service: Open Heart Surgery;  Laterality: N/A;  LIMA-LAD; SEQ SVG-RCA-PD; SVG-DIAG; LEFT RADIAL-OM  . FINGER SURGERY Right   . RADIAL ARTERY HARVEST Left 01/01/2016   Procedure: LEFT RADIAL ARTERY HARVEST;  Surgeon: [name], MD;  Location: [facility];  Service: Open Heart Surgery;  Laterality: Left;  . TEE WITHOUT CARDIOVERSION N/A 01/01/2016   Procedure: TRANSESOPHAGEAL ECHOCARDIOGRAM (TEE);  Surgeon: [name], MD;  Location: [facility];  Service: Open Heart Surgery;  Laterality: N/A;       Home Medications    Prior to Admission medications   Medication Sig Start Date End Date Taking? Authorizing Provider  acetaminophen (TYLENOL) 500 MG tablet Take 2 tablets (1,000 mg total) by mouth every 6 (six) hours as needed for mild pain or fever. 01/05/16   Erin R Barrett, PA-C  aspirin EC 325 MG EC tablet Take 1 tablet (325 mg total) by mouth daily. 01/05/16   Erin R Barrett, PA-C  atorvastatin (LIPITOR) 40 MG tablet Take 1 tablet (40 mg total) by mouth daily at 6 PM. 01/05/16   Erin R Barrett, PA-C  blood glucose meter kit and supplies Dispense based on patient and insurance preference. Use up to four times daily as directed. (FOR ICD-9 250.00, 250.01). 01/05/16   Junie Panning  R Barrett, PA-C  cyclobenzaprine (FLEXERIL) 10 MG tablet Take 10 mg by mouth at bedtime. 12/05/15   Historical Provider, MD  furosemide (LASIX) 40 MG tablet Take 1 tablet (40 mg total) by mouth daily. For 7 Days 01/05/16   Erin R Barrett, PA-C  gabapentin (NEURONTIN) 300 MG capsule Take 300 mg by mouth 3 (three) times daily. 12/05/15   Historical Provider, MD  isosorbide mononitrate (IMDUR) 30 MG 24 hr tablet  Take 0.5 tablets (15 mg total) by mouth daily. For 30 days 01/05/16   Erin R Barrett, PA-C  lisinopril (PRINIVIL,ZESTRIL) 5 MG tablet Take 1 tablet (5 mg total) by mouth daily. 01/05/16   Erin R Barrett, PA-C  meloxicam (MOBIC) 15 MG tablet Take 15 mg by mouth daily. 12/05/15   Historical Provider, MD  metFORMIN (GLUCOPHAGE) 500 MG tablet Take 1 tablet (500 mg total) by mouth 2 (two) times daily with a meal. 01/05/16   Erin R Barrett, PA-C  metoprolol tartrate (LOPRESSOR) 25 MG tablet Take 0.5 tablets (12.5 mg total) by mouth 2 (two) times daily. 01/05/16   Erin R Barrett, PA-C  oxyCODONE (OXY IR/ROXICODONE) 5 MG immediate release tablet Take 1-2 tablets (5-10 mg total) by mouth every 3 (three) hours as needed for severe pain. 01/05/16   Erin R Barrett, PA-C  potassium chloride 20 MEQ TBCR Take 20 mEq by mouth daily. For 7 Days 01/05/16   Lodema Hong Barrett, PA-C    Family History No family history on file.  Social History Social History  Substance Use Topics  . Smoking status: Never Smoker  . Smokeless tobacco: Never Used  . Alcohol use Yes     Comment: occasional     Allergies   Review of patient's allergies indicates no known allergies.   Review of Systems Review of Systems  Constitutional:       Per HPI, otherwise negative  HENT:       Per HPI, otherwise negative  Respiratory:       Per HPI, otherwise negative  Cardiovascular:       Per HPI, otherwise negative  Gastrointestinal: Negative for vomiting.  Endocrine:       Negative aside from HPI  Genitourinary:       Neg aside from HPI   Musculoskeletal:       Per HPI, otherwise negative  Skin: Positive for color change and wound.  Neurological: Negative for syncope.     Physical Exam Updated Vital Signs BP 134/81 (BP Location: Right Arm)   Pulse 98   Temp 97.9 F (36.6 C) (Oral)   Resp 26   Ht 5' 10"  (1.778 m)   Wt 169 lb (76.7 kg)   SpO2 98%   BMI 24.25 kg/m   Physical Exam  Constitutional: He is oriented to  person, place, and time. He appears well-developed. No distress.  HENT:  Head: Normocephalic and atraumatic.  Eyes: Conjunctivae and EOM are normal.  Cardiovascular: Normal rate and regular rhythm.   Pulmonary/Chest: Effort normal. No stridor. No respiratory distress.  Abdominal: He exhibits no distension.  Musculoskeletal: He exhibits no edema.  Neurological: He is alert and oriented to person, place, and time.  Skin: Skin is warm and dry.     Psychiatric: He has a normal mood and affect.  Nursing note and vitals reviewed.    ED Treatments / Results  Labs (all labs ordered are listed, but only abnormal results are displayed) Labs Reviewed  CBC WITH DIFFERENTIAL/PLATELET - Abnormal; Notable for the following:  Result Value   RBC 3.40 (*)    Hemoglobin 10.2 (*)    HCT 30.8 (*)    Platelets 402 (*)    All other components within normal limits  BASIC METABOLIC PANEL      Procedures Procedures (including critical care time)   Initial Impression / Assessment and Plan / ED Course  I have reviewed the triage vital signs and the nursing notes.  Pertinent labs & imaging results that were available during my care of the patient were reviewed by me and considered in my medical decision making (see chart for details).   Final Clinical Impressions(s) / ED Diagnoses  Patient presents one week after coronary artery bypass procedure, now with concern for wound drainage. Here the patient is awake and alert, has a soft, non-peritoneal abdomen, no evidence for bacteremia, sepsis, and no substantial drainage from the wound. There is a trace amount of clear, bloody discharge, consistent with postoperative changes, no purulent discharge. With these reassuring findings, reassuring labs, vitals, the patient was discharged to follow-up in clinic after his wound had fresh dressing applied.    Carmin Muskrat, MD 01/06/16 1758

## 2016-01-08 NOTE — Op Note (Signed)
Kurt Mathis, Kurt Mathis       ACCOUNT NO.:  [id number]  MEDICAL RECORD NO.:  [id number]  LOCATION:  [id number]                        FACILITY:  [facility]  PHYSICIAN:  Revonda Standard. Roxan Hockey, M.D.DATE OF BIRTH:  11/24/1960  DATE OF PROCEDURE:  01/01/2016 DATE OF DISCHARGE:  01/05/2016                              OPERATIVE REPORT   PREOPERATIVE DIAGNOSIS:  Severe three-vessel coronary artery disease.  POSTOPERATIVE DIAGNOSIS:  Severe three-vessel coronary artery disease.  PROCEDURES:  Median sternotomy, extracorporeal circulation, Coronary artery bypass grafting x 5  Left internal mammary artery to left anterior descending artery,  Sequential saphenous vein graft to distal right coronary and posterior    descending,  Saphenous vein graft to diagonal,  Left radial artery to obtuse marginal 1 Endoscopic vein harvest both thighs.  SURGEON:  Revonda Standard. Roxan Hockey, M.D.  ASSISTANT:  John Giovanni, P.A.-C.  ANESTHESIA:  General.  FINDINGS:   Severe diffusely diseased coronaries.  Poor-quality target vessels.  Posterior descending deeply intramyocardial.  Poor candidate for redo grafting.  CLINICAL NOTE:  [name] is a 55 year old man, who presented with rapidly progressive chest pain.  He ruled in for a non-ST elevation MI.  At catheterization, he was found to have severe three-vessel coronary artery disease.  His circumflex was totally occluded and he had severe multifocal disease in the right coronary artery.  The LAD was less diffusely diseased on catheterization, but did appear to have a 70% stenosis in its midsection.  FFR confirmed this was hemodynamically significant.  He was referred for coronary artery bypass grafting.  The indications, risks, benefits and alternatives were discussed in detail with the patient.  He understood and accepted the risks and agreed to proceed.  OPERATIVE NOTE:  [name] was brought to the preoperative holding area on  January 01, 2016.  Anesthesia placed a central line, Swan-Ganz catheter and arterial blood pressure monitoring line. Intravenous antibiotics were administered.  He was taken to the operating room, anesthetized and intubated.  Transesophageal echocardiography was performed.  It showed an ejection fraction approximately 40% with anterior hypokinesis.  Foley catheter was placed. The chest, abdomen, legs and left arm were prepped and draped in usual sterile fashion.  An incision was made on the volar aspect of the left wrist.  A short segment of the radial artery was dissected out.  There was a good pulse distally, and a good Doppler signal in the palmar arch with proximal radial occlusion.  The incision then was extended to just below the antecubital fossa and the radial artery was harvested using the Harmonic scalpel.  Simultaneously, a median sternotomy was performed and the left internal mammary artery was harvested under direct vision.  2000 units of heparin was administered during the vessel harvest.  Both the radial artery and mammary were good-quality vessels.  After harvesting the radial artery, the incision was closed in 2 layers.  The arm was wrapped and then tucked back to the patient's side.  An incision was made in the medial aspect of the right leg at the level of the knee.  The greater saphenous vein was harvested from the right thigh.  There was segment of vein that turned out not to be suitable for grafting.  An additional vein  was harvested endoscopically from the left thigh.  It was of better quality.  After all the conduits had been harvested, the full dose of heparin was administered.  The pericardium was opened.  The ascending aorta was inspected.  It was of normal caliber with no evidence of atherosclerotic disease.  The aorta was cannulated via concentric 2-0 Ethibond pledgeted pursestring sutures.  A dual-stage venous cannula was placed via a pursestring  suture in the right atrial appendage.  Cardiopulmonary bypass was initiated after confirming that there was adequate anticoagulation with ACT measurement.  The patient was cooled to 32 degrees Celsius.  The coronary arteries were inspected and anastomotic sites were chosen.  Of note, the posterior descending vessel was deeply intramyocardial.  There was disease at the takeoff of the posterior descending.  So, it needed to be grafted separately in addition to the distal right coronary.  All the coronary vessels were diffusely diseased with extensive plaque and calcification throughout.  The conduits were inspected and cut to length.  A foam pad was placed in the pericardium to insulate the heart. A temperature probe was placed in the myocardial septum and a cardioplegia cannula was placed in the ascending aorta.  The aorta was crossclamped.  The left ventricle was emptied via the aortic root vent.  Cardiac arrest then was achieved with combination of cold antegrade blood cardioplegia and topical iced saline.  After achieving a complete diastolic arrest and adequate myocardial septal cooling, the following distal anastomoses were performed.  First, a reversed saphenous vein graft was placed sequentially to the distal right coronary and the posterior descending.  The anastomosis to the distal right was done just beyond the takeoff of the posterior Descending. The RCA was a large vessel.  There was significant calcification, but a 2-mm probe was accepted.  A side-to-side anastomosis was performed to this vessel with a running 7-0 Prolene suture.  All anastomoses were probed proximally and distally at their completion to ensure patency.  The distal end of the vein then was beveled and anastomosed end-to-side to the posterior descending.  This was a 1.5-mm poor-quality target.  Again, a 7-0 Prolene suture was used for the anastomosis.  Cardioplegia was administered down the vein. There was  good flow and good hemostasis.  Next, reversed saphenous vein graft was placed end-to-side to the first diagonal branch of the LAD.  This vessel likewise was heavily calcified distally.  It did accept a 1.5-mm probe.  The vein was of good quality and an end-to-side anastomosis was performed with a running 7-0 Prolene suture.  There was good flow and good hemostasis with cardioplegia administration.  Next, the distal end of the left radial artery was beveled, it was anastomosed end-to-side to OM1.  This was again a diffusely diseased target.  The radial was of good quality and end-to-side anastomosis was performed with a running 8-0 Prolene suture.  At the completion of anastomosis, cardioplegia was administered down the aortic root.  There was good backbleeding from the radial artery.  A bulldog clamp was placed proximally.  The left internal mammary artery was brought through a window in the pericardium.  The distal end was beveled.  It was anastomosed end-to- side to the distal LAD.  The LAD was diffusely diseased with multiple calcified plaques throughout.  The end-to-side anastomosis was performed with a running 8-0 Prolene suture.  At the completion of the anastomosis, the bulldog clamp was removed.  Rapid septal rewarming was noted.  The bulldog clamp was  replaced and the mammary pedicle was tacked to the epicardial surface of the heart with 6-0 Prolene sutures.  Additional cardioplegia was administered.  The vein grafts and left radial were cut to length.  The proximal anastomoses were performed to 4.5-mm punch aortotomies with running 6-0 Prolene sutures for the veins and a running 7-0 Prolene suture for the radial.  At the completion of the final proximal anastomosis, the patient was placed in Trendelenburg position.  Lidocaine was administered.  The bulldog clamp was again removed from the mammary artery.  The aortic root was de-aired and the aortic crossclamp was removed.   The total crossclamp time was 84 minutes.  While rewarming was completed, all proximal and distal anastomoses were inspected for hemostasis.  Epicardial pacing wires were placed on the right ventricle and right atrium.  A dopamine infusion was initiated at 3 mcg/kg/minute.  When the patient had rewarmed to a core temperature of 37 degrees Celsius, he was weaned from cardiopulmonary bypass on the first attempt.  The total bypass time was 150 minutes.  The patient did come off bypass in sinus rhythm. The postbypass echocardiogram was unchanged from the prebypass study.  A test dose of protamine was administered and was well tolerated.  The atrial and aortic cannulae were removed.  The remainder of the protamine was administered without incident.  The chest was irrigated with warm saline.  Hemostasis was achieved.  Left pleural and mediastinal chest tubes were placed through separate subcostal incisions.  The pericardium was reapproximated over the ascending aorta and base of the heart with interrupted 3-0 silk sutures.  The sternum was closed with a combination of single and double heavy-gauge stainless steel wires.  Pectoralis fascia, subcutaneous tissue and skin were closed in standard fashion. All sponge, needle and instrument counts were correct at the end of the procedure.  The patient remained hemodynamically stable throughout the postbypass period.  He was taken from the operating room to the surgical intensive care unit in good condition.  Poor candidate for redo surgery due to diffusely diseased coronaries.     Revonda Standard Roxan Hockey, M.D.     SCH/MEDQ  D:  01/07/2016  T:  01/08/2016  Job:  [id number]

## 2016-01-10 ENCOUNTER — Telehealth: Payer: Self-pay | Admitting: Cardiovascular Disease

## 2016-01-10 NOTE — Telephone Encounter (Signed)
APT. REMINDER CALL, LMTCB °

## 2016-01-11 ENCOUNTER — Ambulatory Visit (INDEPENDENT_AMBULATORY_CARE_PROVIDER_SITE_OTHER): Payer: Self-pay | Admitting: Internal Medicine

## 2016-01-11 ENCOUNTER — Other Ambulatory Visit: Payer: Self-pay | Admitting: *Deleted

## 2016-01-11 VITALS — BP 86/66 | HR 100 | Temp 98.3°F | Ht 71.0 in | Wt 153.6 lb

## 2016-01-11 DIAGNOSIS — E118 Type 2 diabetes mellitus with unspecified complications: Principal | ICD-10-CM

## 2016-01-11 DIAGNOSIS — Z951 Presence of aortocoronary bypass graft: Secondary | ICD-10-CM

## 2016-01-11 DIAGNOSIS — IMO0002 Reserved for concepts with insufficient information to code with codable children: Secondary | ICD-10-CM

## 2016-01-11 DIAGNOSIS — Z7984 Long term (current) use of oral hypoglycemic drugs: Secondary | ICD-10-CM

## 2016-01-11 DIAGNOSIS — G8918 Other acute postprocedural pain: Secondary | ICD-10-CM

## 2016-01-11 DIAGNOSIS — E1165 Type 2 diabetes mellitus with hyperglycemia: Secondary | ICD-10-CM

## 2016-01-11 LAB — GLUCOSE, CAPILLARY: GLUCOSE-CAPILLARY: 158 mg/dL — AB (ref 65–99)

## 2016-01-11 MED ORDER — OXYCODONE HCL 5 MG PO TABS: 5.0000 mg | ORAL_TABLET | ORAL | 0 refills | Status: DC | PRN

## 2016-01-11 NOTE — Progress Notes (Signed)
Met with patient and significant other during their visit to answer their questions about meal planning for diabetes and heart disease.  Introduced them to basic carb consistency using food models and label reading to determine portion sizes. They asked for help with affordable blood sugar meter. They have one that they just purchased that they cannot recall the name of. This Probation officer informed them about the [facility] and supplies that is available to them if they apply and are approved for the hospital discount . They verbalized understanding.   Kurt Mathis, Butch Penny

## 2016-01-11 NOTE — Patient Instructions (Signed)
[name] it was nice meeting you today.  -Continue taking Metformin   -Check your blood glucose once a day as we discussed   -Please return for a follow-up visit in 1 month

## 2016-01-11 NOTE — Telephone Encounter (Signed)
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 I informed him that a new signed script would be available at our front desk today. He voiced understanding.

## 2016-01-12 NOTE — Assessment & Plan Note (Signed)
Lab Results  Component Value Date   HGBA1C 11.7 (H) 12/24/2015   HGBA1C 12.0 (H) 12/23/2015     Assessment: Diabetes control:  uncontrolled (above goal A1c <7)   Comments: Recently diagnosed and started on metformin 500 bid. Checking CBG 4x per day. Log showing values lying within the 150-250 range. He is on an ACEI.   Plan: Medications:  continue current medications Home glucose monitoring: Frequency:  once a day Timing:  before meal Instruction/counseling given: reminded to get eye exam, reminded to bring blood glucose meter & log to each visit, reminded to bring medications to each visit, discussed foot care and discussed diet  Other plans:  -Patient met with Butch Penny at this visit -Order for retinal fundoscopy placed -Consider increasing dose of Metformin at next visit

## 2016-01-12 NOTE — Progress Notes (Signed)
   CC: Pt is here to discuss his recently diagnosed diabetes.   HPI:  [name] is a 55 y.o. M with a PMHx of conditions listed below presenting to the clinic to discuss his recently diagnosed diabetes. Patient had CABG done on 9/25 and labs at that time revealed A1c 11.7. He was started on metformin 500 mg bid and sent to IM clinic for further mgmt of his diabetes. Please see problem based charting for the status of the patient's current and chronic medical conditions.   Past Medical History:  Diagnosis Date  . Cardiomyopathy ([place])   . Coronary artery disease   . Diabetes mellitus without complication ([place])    type 2  . HLD (hyperlipidemia)   . NSTEMI (non-ST elevated myocardial infarction) ([place])     Review of Systems:  Pertinent positives mentioned in HPI. Remainder of all ROS negative.   Physical Exam:  Vitals:   01/11/16 1117 01/11/16 1130  BP: (!) 90/58 (!) 86/66  Pulse: (!) 105 100  Temp: 98.3 F (36.8 C)   TempSrc: Oral   SpO2: 96%   Weight: 153 lb 9.6 oz (69.7 kg)   Height: 5\' 11"  (1.803 m)    Physical Exam  Constitutional: He is oriented to person, place, and time. He appears well-developed and well-nourished. No distress.  HENT:  Head: Normocephalic and atraumatic.  Eyes: EOM are normal.  Neck: Neck supple. No tracheal deviation present.  Cardiovascular: Normal rate, regular rhythm and intact distal pulses.   Pulmonary/Chest: Effort normal and breath sounds normal. No respiratory distress.  Abdominal: Soft. Bowel sounds are normal. He exhibits no distension. There is no tenderness.  Neurological: He is alert and oriented to person, place, and time.  Skin: Skin is warm and dry.    Assessment & Plan:   See Encounters Tab for problem based charting.  Patient discussed with Dr. Dareen Piano

## 2016-01-15 NOTE — Progress Notes (Signed)
Medicine attending: Medical history, presenting problems, physical findings, and medications, reviewed with resident physician Dr Shela Leff on the day of the patient visit and I concur with her evaluation and management plan.

## 2016-01-22 ENCOUNTER — Ambulatory Visit: Payer: Self-pay | Admitting: Dietician

## 2016-01-22 ENCOUNTER — Encounter: Payer: Self-pay | Admitting: Student

## 2016-01-22 ENCOUNTER — Ambulatory Visit (INDEPENDENT_AMBULATORY_CARE_PROVIDER_SITE_OTHER): Payer: Self-pay | Admitting: Student

## 2016-01-22 VITALS — BP 100/60 | HR 107 | Ht 71.0 in | Wt 151.8 lb

## 2016-01-22 DIAGNOSIS — Z951 Presence of aortocoronary bypass graft: Secondary | ICD-10-CM

## 2016-01-22 DIAGNOSIS — E118 Type 2 diabetes mellitus with unspecified complications: Secondary | ICD-10-CM

## 2016-01-22 DIAGNOSIS — R42 Dizziness and giddiness: Secondary | ICD-10-CM

## 2016-01-22 DIAGNOSIS — I25810 Atherosclerosis of coronary artery bypass graft(s) without angina pectoris: Secondary | ICD-10-CM

## 2016-01-22 DIAGNOSIS — I255 Ischemic cardiomyopathy: Secondary | ICD-10-CM

## 2016-01-22 DIAGNOSIS — E782 Mixed hyperlipidemia: Secondary | ICD-10-CM

## 2016-01-22 DIAGNOSIS — E1165 Type 2 diabetes mellitus with hyperglycemia: Secondary | ICD-10-CM

## 2016-01-22 DIAGNOSIS — IMO0002 Reserved for concepts with insufficient information to code with codable children: Secondary | ICD-10-CM

## 2016-01-22 LAB — CBC
HEMATOCRIT: 36.5 % — AB (ref 38.5–50.0)
HEMOGLOBIN: 12 g/dL — AB (ref 13.2–17.1)
MCH: 29.4 pg (ref 27.0–33.0)
MCHC: 32.9 g/dL (ref 32.0–36.0)
MCV: 89.5 fL (ref 80.0–100.0)
MPV: 9.9 fL (ref 7.5–12.5)
Platelets: 509 10*3/uL — ABNORMAL HIGH (ref 140–400)
RBC: 4.08 MIL/uL — ABNORMAL LOW (ref 4.20–5.80)
RDW: 13.9 % (ref 11.0–15.0)
WBC: 6.9 10*3/uL (ref 3.8–10.8)

## 2016-01-22 MED ORDER — LISINOPRIL 5 MG PO TABS: 2.5000 mg | ORAL_TABLET | Freq: Every day | ORAL | 3 refills | Status: DC

## 2016-01-22 MED ORDER — FUROSEMIDE 20 MG PO TABS: 20.0000 mg | ORAL_TABLET | Freq: Every day | ORAL | 6 refills | Status: DC | PRN

## 2016-01-22 NOTE — Progress Notes (Signed)
Cardiology Office Note    Date:  01/22/2016   ID:  Kurt Mathis, DOB [birth year], MRN [id number]  PCP:  Asencion Partridge, MD  Cardiologist: Dr. Sallyanne Kuster   Chief Complaint  Patient presents with  . Shortness of Breath    WHEN LYING DOWN AND WITH PAIN    History of Present Illness:    Kurt Mathis is a 55 y.o. male with past medical history of CAD, HLD, and Type 2 DM who presents to the office today for hospital follow-up.  He was admitted 12/23/2015 for symptoms consistent with unstable angina. Troponin values peaked at 6.97 and he was started on Heparin. Cardiac catheterization 9/18 showed severe diffuse multivessel disease including total occlusion of the ProxCx, severe 85% stenosis of the RCA and 60-70% stenosis along the LAD. CT Surgery was consulted and CABG was recommended. This was performed on 9/25 with LIMA-LAD, SVG-RCA-PDA, SVG-D1, and Left Radial-OM1. He did experience some post-operative anemia and required 2 units pRBC's. A1c was found to be elevated to 11.7 and he was started on Metformin while admitted. Was discharged on 9/29 with medications including Atorvastatin '40mg'$  daily along with ASA '325mg'$  daily, Imdur '15mg'$  daily, Lisinopril '5mg'$  daily, Lasix '40mg'$  daily, and Lopressor 12.'5mg'$  BID.  Was seen in the ER the day following hospital discharge for drainage from his surgical site. Drainage was noted to be clear and bloody with no purulent discharge noted. WBC count was normal at 8.4 and he was discharged home as this was thought to be consistent with normal post-operative changes.   Today, he reports overall doing well. Has incisional pain along his sternum but otherwise no chest discomfort. He is taking Oxycodone approximately once per day which provides relief of this pain. Has been walking for up to 10 minutes at a time without any exertional components. Denies any dyspnea with exertion but does report occasional orthopnea. Had been discharged on Lasix 40 mg daily  to take for 7 days due to postoperative volume overload, but has now completed this course. He has been keeping a daily log of weights, which shows this is down from 159 pounds to 150 pounds on his home scales.  Has not been checking blood pressure daily. Does note episodes of occasional dizziness and lightheadedness. No positional component identified. No presyncope or syncopal events.  He is being followed by the internal medicine clinic for his diabetes. He says his numbers at home have been varying from the 140's to 160's. Continues to be on Metformin, not yet requiring initiation of insulin.   Past Medical History:  Diagnosis Date  . Cardiomyopathy ([place])   . Coronary artery disease    a. 12/2015: NSTEMI w/ cath showing severe multivessel disease. CABG recommended. b. CABG on 01/01/2016 w/ LIMA-LAD, SVG-RCA-PDA, SVG-D1, and Left Radial-OM1.  . Diabetes mellitus without complication ([place])    a. A1c elevated to 11.7 in 12/2015.  Marland Kitchen HLD (hyperlipidemia)   . NSTEMI (non-ST elevated myocardial infarction) [facility])     Past Surgical History:  Procedure Laterality Date  . CARDIAC CATHETERIZATION N/A 12/25/2015   Procedure: Left Heart Cath and Coronary Angiography;  Surgeon: Belva Crome, MD;  Location: [place] [facility];  Service: Cardiovascular;  Laterality: N/A;  . CARDIAC CATHETERIZATION N/A 12/26/2015   Procedure: Intravascular Pressure Wire/FFR Study;  Surgeon: Burnell Blanks, MD;  Location: [place] [facility];  Service: Cardiovascular;  Laterality: N/A;  . CORONARY ARTERY BYPASS GRAFT N/A 01/01/2016   Procedure: CORONARY ARTERY BYPASS GRAFTING (CABG), ON PUMP,  TIMES 5, USING LEFT RADIAL ARTERY AND LEFT INTERNAL MAMMARY ARTERY, BILATERAL GREATER SAPHENOUS VEINS HARVESTED ENDOSCOPICALLY;  Surgeon: [name], MD;  Location: [place];  Service: Open Heart Surgery;  Laterality: N/A;  LIMA-LAD; SEQ SVG-RCA-PD; SVG-DIAG; LEFT RADIAL-OM  . FINGER SURGERY Right   . RADIAL ARTERY  HARVEST Left 01/01/2016   Procedure: LEFT RADIAL ARTERY HARVEST;  Surgeon: [name], MD;  Location: [place];  Service: Open Heart Surgery;  Laterality: Left;  . TEE WITHOUT CARDIOVERSION N/A 01/01/2016   Procedure: TRANSESOPHAGEAL ECHOCARDIOGRAM (TEE);  Surgeon: [name], MD;  Location: [place];  Service: Open Heart Surgery;  Laterality: N/A;    Current Medications: Outpatient Medications Prior to Visit  Medication Sig Dispense Refill  . aspirin EC 325 MG EC tablet Take 1 tablet (325 mg total) by mouth daily. 30 tablet 0  . atorvastatin (LIPITOR) 40 MG tablet Take 1 tablet (40 mg total) by mouth daily at 6 PM. 30 tablet 3  . blood glucose meter kit and supplies Dispense based on patient and insurance preference. Use up to four times daily as directed. (FOR ICD-9 250.00, 250.01). 1 each 0  . metFORMIN (GLUCOPHAGE) 500 MG tablet Take 1 tablet (500 mg total) by mouth 2 (two) times daily with a meal. 60 tablet 3  . metoprolol tartrate (LOPRESSOR) 25 MG tablet Take 0.5 tablets (12.5 mg total) by mouth 2 (two) times daily. 60 tablet 3  . oxyCODONE (OXY IR/ROXICODONE) 5 MG immediate release tablet Take 1-2 tablets (5-10 mg total) by mouth every 4 (four) hours as needed for severe pain. 30 tablet 0  . lisinopril (PRINIVIL,ZESTRIL) 5 MG tablet Take 1 tablet (5 mg total) by mouth daily. 30 tablet 3  . acetaminophen (TYLENOL) 500 MG tablet Take 2 tablets (1,000 mg total) by mouth every 6 (six) hours as needed for mild pain or fever. 30 tablet 0  . cyclobenzaprine (FLEXERIL) 10 MG tablet Take 10 mg by mouth at bedtime.  5  . furosemide (LASIX) 40 MG tablet Take 1 tablet (40 mg total) by mouth daily. For 7 Days 7 tablet 0  . gabapentin (NEURONTIN) 300 MG capsule Take 300 mg by mouth 3 (three) times daily.  4  . isosorbide mononitrate (IMDUR) 30 MG 24 hr tablet Take 0.5 tablets (15 mg total) by mouth daily. For 30 days 30 tablet 0  . meloxicam (MOBIC) 15 MG tablet Take 15 mg by mouth daily.   5  . potassium chloride 20 MEQ TBCR Take 20 mEq by mouth daily. For 7 Days 7 tablet 0   No facility-administered medications prior to visit.      Allergies:   Review of patient's allergies indicates no known allergies.   Social History   Social History  . Marital status: Married    Spouse name: N/A  . Number of children: N/A  . Years of education: N/A   Social History Main Topics  . Smoking status: Never Smoker  . Smokeless tobacco: Never Used  . Alcohol use Yes     Comment: occasional  . Drug use: No  . Sexual activity: Not Asked   Other Topics Concern  . None   Social History Narrative  . None     Family History:  The patient's family history includes Hypertension in his mother.   Review of Systems:   Please see the history of present illness.     General:  No chills, fever, night sweats or weight changes.  Cardiovascular:  No  dyspnea on exertion, edema, palpitations, paroxysmal nocturnal dyspnea. Positive for chest pain and orthopnea.  Dermatological: No rash, lesions/masses Respiratory: No cough, dyspnea Urologic: No hematuria, dysuria Abdominal:   No nausea, vomiting, diarrhea, bright red blood per rectum, melena, or hematemesis Neurologic:  No visual changes, wkns, changes in mental status. Positive for dizziness.  All other systems reviewed and are otherwise negative except as noted above.   Physical Exam:    VS:  BP 100/60   Pulse (!) 107   Ht '5\' 11"'$  (1.803 m)   Wt 151 lb 12.8 oz (68.9 kg)   BMI 21.17 kg/m    General: Well developed, well nourished,male appearing in no acute distress. Head: Normocephalic, atraumatic, sclera non-icteric, no xanthomas, nares are without discharge.  Neck: No carotid bruits. JVD not elevated.  Lungs: Respirations regular and unlabored, without wheezes or rales.  Heart: Regular rate and rhythm. No S3 or S4.  No murmur, no rubs, or gallops appreciated. Well-healing sternal surgical incision noted. No drainage or erythema  present. Abdomen: Soft, non-tender, non-distended with normoactive bowel sounds. No hepatomegaly. No rebound/guarding. No obvious abdominal masses. Msk:  Strength and tone appear normal for age. No joint deformities or effusions. Extremities: No clubbing or cyanosis. No edema.  Distal pedal pulses are 2+ bilaterally. Surgical incisions along left wrist and lower extremities are well-healing without erythema or drainage. Neuro: Alert and oriented X 3. Moves all extremities spontaneously. No focal deficits noted. Psych:  Responds to questions appropriately with a normal affect. Skin: No rashes or lesions noted  Wt Readings from Last 3 Encounters:  01/22/16 151 lb 12.8 oz (68.9 kg)  01/11/16 153 lb 9.6 oz (69.7 kg)  01/06/16 169 lb (76.7 kg)     Studies/Labs Reviewed:   EKG:  EKG is not ordered today.   Recent Labs: 12/23/2015: ALT 40; TSH 1.550 01/02/2016: Magnesium 2.3 01/06/2016: BUN 10; Creatinine, Ser 0.62; Hemoglobin 10.2; Platelets 402; Potassium 4.2; Sodium 132   Lipid Panel    Component Value Date/Time   CHOL 171 12/24/2015 1256   TRIG 261 (H) 12/24/2015 1256   HDL 20 (L) 12/24/2015 1256   CHOLHDL 8.6 12/24/2015 1256   VLDL 52 (H) 12/24/2015 1256   LDLCALC 99 12/24/2015 1256    Additional studies/ records that were reviewed today include:   Cardiac Catheterization: 12/25/2015  Mid RCA lesion, 60 %stenosed.  Dist RCA lesion, 85 %stenosed.  RPDA lesion, 75 %stenosed.  3rd RPLB lesion, 100 %stenosed.  Mid LAD to Dist LAD lesion, 65 %stenosed.  Ost 1st Diag to 1st Diag lesion, 75 %stenosed.  1st Diag lesion, 95 %stenosed.  [place] lesion, 95 %stenosed.  Prox Cx to Mid Cx lesion, 100 %stenosed.  The left ventricular ejection fraction is 45-50% by visual estimate.    Severe diffuse multivessel coronary disease including total occlusion of the proximal circumflex (feels left to left and right-to-left by collaterals), severe multifocal disease throughout the mid,  distal, and continuation of the RCA.  LAD is diffusely diseased with up to 60-70% stenosis in the midsegment. A large diagonal also has segmental severe stenosis.  Left ventricular systolic dysfunction with anterior wall hypokinesis. EF 40-50%.   RECOMMENDATIONS:   Aggressive risk factor modification  Medical therapy for CAD.  If unacceptable symptoms, CABG would be needed.  Have initially started Plavix but on second thought we'll discontinue this medication. No doses were given in the cath lab.  Echocardiogram: 12/24/2015 Study Conclusions  - Left ventricle: The cavity size was normal. There was moderate  concentric hypertrophy. Systolic function was mildly reduced. The   estimated ejection fraction was in the range of 45% to 50%. There   is hypokinesis of the inferolateral, inferior, and inferoseptal   myocardium. Features are consistent with a pseudonormal left   ventricular filling pattern, with concomitant abnormal relaxation   and increased filling pressure (grade 2 diastolic dysfunction). - Mitral valve: Calcified annulus. There was mild regurgitation.  Assessment:    1. Coronary artery disease involving coronary bypass graft of native heart without angina pectoris   2. S/P CABG x 5   3. Cardiomyopathy, ischemic   4. Dizziness   5. Mixed hyperlipidemia   6. Uncontrolled type 2 diabetes mellitus with complication, without long-term current use of insulin ([place])      Plan:   In order of problems listed above:  1. Coronary Artery Disease (s/p CABG x5) - admitted in 12/2015 for NSTEMI and cath showed severe diffuse multivessel disease. CT Surgery was consulted and CABG was recommended. This was performed on 9/25 with LIMA-LAD, SVG-RCA-PDA, SVG-D1, and Left Radial-OM1.  - Doing well from recent surgery. Does have expected incisional pain along sternum but no chest discomfort or dyspnea with exertion. Walking up to 10 minute intervals without anginal symptoms.  Surgical incisions are stable without evidence of erythema or infection.  - Developed post-operative anemia and required 2 units pRBC's. Will recheck CBC today.  - continue ASA, statin, BB, Imdur, and Lisinopril (reduced dosing as below secondary to dizziness and possible hypotension). Has follow-up with Dr. Roxan Hockey in two weeks.   2. Ischemic Cardiomyopathy - Echo during recent admission showed EF of 45-50% with grade 2 diastolic dysfunction.  - Had been volume overloaded at time of hospital discharge, likely secondary to administration of IV fluids in the postoperative setting. Had been on Lasix 40 mg daily for 7 days. Does not appear volume overloaded on physical exam. Does note episodes of orthopnea at night. Will provide Rx for Lasix '20mg'$  PRN for weight gain of 3 pounds overnight or 5 pounds in one week. Repeat BMET today.  - continue BB and ACE-I.   3. Dizziness - Reports episodes of dizziness which occurs spontaneously. Does not check his blood pressure regularly at home. Is on the soft side today at 100/60. Will decrease Lisinopril from '5mg'$  daily to 2.'5mg'$  daily.  4. HLD - Lipid Panel in 12/2015 showed total cholesterol 173, HDL 34, and LDL 84. Started on Atorvastatin '40mg'$  daily. - will need repeat Lipid Panel and LFT's in 03/2016.  5. Uncontrolled Type 2 DM - A1c 11.7 during recent hospitalization. Started on Metformin. - being followed by PCP.    Medication Adjustments/Labs and Tests Ordered: Current medicines are reviewed at length with the patient today.  Concerns regarding medicines are outlined above.  Medication changes, Labs and Tests ordered today are listed in the Patient Instructions below. Patient Instructions  Medication Instructions:   DECREASE LISINOPRIL TO 2.5 MG ONCE DAILY= 1/2 OF THE 5 MG TABLET ONCE DAILY  START FUROSEMIDE 20 MG ONCE DAILY AS NEEDED FOR WEIGHT GAIN  Labwork: Your physician recommends that you HAVE LAB WORK TODAY  Follow-Up: Your  physician recommends that you schedule a follow-up appointment in: 2 MONTHS WITH DR CROITORU   TAKE FUROSEMIDE FOR WEIGHT GAIN OF 3 LBS OVERNIGHT OR 5 LBS IN ONE WEEK     Signed, Erma Heritage, [place]  01/22/2016 5:38 PM    [facility] [address], [place], [place]  [address] Phone: [phone]; Fax: [phone]  [address] [place], [place] [address] Phone: [phone]

## 2016-01-22 NOTE — Patient Instructions (Signed)
Medication Instructions:   DECREASE LISINOPRIL TO 2.5 MG ONCE DAILY= 1/2 OF THE 5 MG TABLET ONCE DAILY  START FUROSEMIDE 20 MG ONCE DAILY AS NEEDED FOR WEIGHT GAIN  Labwork:  Your physician recommends that you HAVE LAB WORK TODAY  Follow-Up:  Your physician recommends that you schedule a follow-up appointment in: 2 MONTHS WITH DR CROITORU   TAKE FUROSEMIDE FOR WEIGHT GAIN OF 3 LBS OVERNIGHT OR 5 LBS IN ONE WEEK

## 2016-01-23 LAB — BASIC METABOLIC PANEL
BUN: 26 mg/dL — ABNORMAL HIGH (ref 7–25)
CHLORIDE: 97 mmol/L — AB (ref 98–110)
CO2: 24 mmol/L (ref 20–31)
Calcium: 9.3 mg/dL (ref 8.6–10.3)
Creat: 1.21 mg/dL (ref 0.70–1.33)
GLUCOSE: 161 mg/dL — AB (ref 65–99)
POTASSIUM: 5.2 mmol/L (ref 3.5–5.3)
SODIUM: 134 mmol/L — AB (ref 135–146)

## 2016-01-24 ENCOUNTER — Telehealth: Payer: Self-pay | Admitting: Student

## 2016-01-24 ENCOUNTER — Ambulatory Visit: Payer: Self-pay

## 2016-01-24 NOTE — Telephone Encounter (Signed)
New Message  Pt wife returning RN call. Please call back to discuss

## 2016-01-24 NOTE — Telephone Encounter (Signed)
Returned ps call. We discussed his lab results and advised him that he needs to establish with PCP re: high platelet count.  Pt verbalized understanding.

## 2016-01-29 ENCOUNTER — Telehealth: Payer: Self-pay

## 2016-01-29 NOTE — Telephone Encounter (Signed)
Pt wife needs to speak with a nurse regarding blood sugar.

## 2016-01-29 NOTE — Telephone Encounter (Signed)
Rtc, spoke w/ pt, reinforced teaching from donnap., testing cbg 1x daily, confirmed appt days and times, he will be here wed to see donnap and will bring meter

## 2016-01-31 ENCOUNTER — Encounter: Payer: Self-pay | Admitting: Dietician

## 2016-01-31 ENCOUNTER — Ambulatory Visit (INDEPENDENT_AMBULATORY_CARE_PROVIDER_SITE_OTHER): Payer: Self-pay | Admitting: Dietician

## 2016-01-31 VITALS — Ht 69.0 in | Wt 151.9 lb

## 2016-01-31 DIAGNOSIS — IMO0002 Reserved for concepts with insufficient information to code with codable children: Secondary | ICD-10-CM

## 2016-01-31 DIAGNOSIS — Z713 Dietary counseling and surveillance: Secondary | ICD-10-CM

## 2016-01-31 DIAGNOSIS — E118 Type 2 diabetes mellitus with unspecified complications: Principal | ICD-10-CM

## 2016-01-31 DIAGNOSIS — E1165 Type 2 diabetes mellitus with hyperglycemia: Secondary | ICD-10-CM

## 2016-01-31 NOTE — Patient Instructions (Addendum)
Your blood sugars are getting better as you have lost weight   A healthy weight for you is 150# to 160#.    Normal blood sugar is 70-99. This is your goal.  Be sure to eat 3 meals and 3 snacks each day.    Drink 2-3 8 oz cups of milk each day morning, afternoon and evening  Ask your doctor if you can measure 1/8 teaspoon into an empty salt shaker each day to use.   Eat more fiber- see handouts. gradually increase fiber to 40 grams per day.   Look for foods that have more than 3-5 grams of fiber per serving.  To make fruit last, can freeze peeled bananas, grapes and eat 2-4 weeks later.   Also prunes count as fruit and 4 oz orange juice is also a good fruit serving.   Please make an appointment with me in 2-3 months.   I can take a picture of the back of your eyes to be sure diabetes is not affecting them when you get the orange card from [facility].

## 2016-01-31 NOTE — Progress Notes (Signed)
Diabetes Self-Management Education  Visit Type: First/Initial  Appt. Start Time: 1315 Appt. End Time: 1410  01/31/2016  [name], identified by name and date of birth, is a 55 y.o. male with a diagnosis of Diabetes: Type 2.   ASSESSMENT  Height 5\' 9"  (1.753 m), weight 151 lb 14.4 oz (68.9 kg). Body mass index is 22.43 kg/m.   His height and weight were measured with shoes on. He says he has lost weight, but not as much as his chart shows as he was swollen when he left the hospital and at his first visit here. He has provably lost about 10# of fat and lean body mass in the past month intentionally. Advise no further weight loss and aked him to ask his doctor about use of salt and starting to exercise his upper body      Diabetes Self-Management Education - 01/31/16 1400      Visit Information   Visit Type First/Initial     Initial Visit   Diabetes Type Type 2   Are you currently following a meal plan? Yes   What type of meal plan do you follow? low salt, low starch   Are you taking your medications as prescribed? Yes   Date Diagnosed --  september 2017     Health Coping   How would you rate your overall health? Good     Psychosocial Assessment   Patient Belief/Attitude about Diabetes Motivated to manage diabetes   Self-care barriers English as a second language;Lack of material resources   Self-management support Doctor's office;Family;CDE visits   Patient Concerns Nutrition/Meal planning  does not like the low salt foods, hungry at times   Preferred Learning Style No preference indicated   Learning Readiness Change in progress   How often do you need to have someone help you when you read instructions, pamphlets, or other written materials from your doctor or pharmacy? 2 - Rarely     Pre-Education Assessment   Patient understands the diabetes disease and treatment process. Demonstrates understanding / competency   Patient understands incorporating  nutritional management into lifestyle. Needs Review   Patient undertands incorporating physical activity into lifestyle. Demonstrates understanding / competency   Patient understands using medications safely. Demonstrates understanding / competency   Patient understands monitoring blood glucose, interpreting and using results Needs Review   Patient understands prevention, detection, and treatment of acute complications. Needs Instruction   Patient understands prevention, detection, and treatment of chronic complications. Needs Instruction   Patient understands how to develop strategies to address psychosocial issues. Demonstrates understanding / competency   Patient understands how to develop strategies to promote health/change behavior. Demonstrates understanding / competency     Complications   Last HgB A1C per patient/outside source 11.7 %   How often do you check your blood sugar? 1-2 times/day   Fasting Blood glucose range (mg/dL) 70-129;130-179   Number of hypoglycemic episodes per month 0   Number of hyperglycemic episodes per week 11   Can you tell when your blood sugar is high? No   Have you had a dilated eye exam in the past 12 months? No   Have you had a dental exam in the past 12 months? No   Are you checking your feet? No     Dietary Intake   Breakfast cereal. milk   Snack (morning) yogurt   Lunch chicken, vegetables   Snack (afternoon) nuts   Dinner fish, beans, vegtables   Snack (evening) fruit  Beverage(s) water, milk     Exercise   Exercise Type ADL's;Light (walking / raking leaves)   How many days per week to you exercise? 4   How many minutes per day do you exercise? 20   Total minutes per week of exercise 80     Patient Education   Previous Diabetes Education No   Nutrition management  Role of diet in the treatment of diabetes and the relationship between the three main macronutrients and blood glucose level;Reviewed blood glucose goals for pre and post meals  and how to evaluate the patients' food intake on their blood glucose level.;Meal timing in regards to the patients' current diabetes medication.;Meal options for control of blood glucose level and chronic complications.   Monitoring Taught/discussed recording of test results and interpretation of SMBG.   Chronic complications Assessed and discussed foot care and prevention of foot problems;Retinopathy and reason for yearly dilated eye exams;Reviewed with patient heart disease, higher risk of, and prevention     Individualized Goals (developed by patient)   Nutrition Follow meal plan discussed     Outcomes   Expected Outcomes Demonstrated interest in learning. Expect positive outcomes   Future DMSE 2 months   Program Status Not Completed      Individualized Plan for Diabetes Self-Management Training:   Learning Objective:  Patient will have a greater understanding of diabetes self-management. Patient education plan is to attend individual and/or group sessions per assessed needs and concerns.   Plan:   Patient Instructions  Your blood sugars are getting better as you have lost weight   A healthy weight for you is 150# to 160#.    Normal blood sugar is 70-99. This is your goal.  Be sure to eat 3 meals and 3 snacks each day.    Drink 2-3 8 oz cups of milk each day morning, afternoon and evening  Ask your doctor if you can measure 1/8 teaspoon into an empty salt shaker each day to use.   Eat more fiber- see handouts. gradually increase fiber to 40 grams per day.   Look for foods that have more than 3-5 grams of fiber per serving.  To make fruit last, can freeze peeled bananas, grapes and eat 2-4 weeks later.   Also prunes count as fruit and 4 oz orange juice is also a good fruit serving.   Please make an appointment with me in 2-3 months.   I can take a picture of the back of your eyes to be sure diabetes is not affecting them when you get the orange card from [facility].      Expected Outcomes:  Demonstrated interest in learning. Expect positive outcomes  Education material provided: Meal plan card, Snack sheet and No sodium seasonings  If problems or questions, patient to contact team via:  Phone  Future DSME appointment: 2 months

## 2016-02-01 ENCOUNTER — Other Ambulatory Visit: Payer: Self-pay | Admitting: *Deleted

## 2016-02-01 DIAGNOSIS — G8918 Other acute postprocedural pain: Secondary | ICD-10-CM

## 2016-02-01 MED ORDER — OXYCODONE HCL 5 MG PO TABS: 5.0000 mg | ORAL_TABLET | Freq: Four times a day (QID) | ORAL | 0 refills | Status: DC | PRN

## 2016-02-01 MED ORDER — FUROSEMIDE 20 MG PO TABS: 20.0000 mg | ORAL_TABLET | Freq: Every day | ORAL | 6 refills | Status: DC | PRN

## 2016-02-01 NOTE — Telephone Encounter (Signed)
A family member has called to request a pain med refill for [name], s/p CABG 01/01/16. He has his first post op visit with Dr. Roxan Hockey next week. This is his second pain med refill request since discharge. I informed her that a new script would be available today at our office and she agreed.

## 2016-02-05 ENCOUNTER — Other Ambulatory Visit: Payer: Self-pay | Admitting: Thoracic Surgery (Cardiothoracic Vascular Surgery)

## 2016-02-05 DIAGNOSIS — Z951 Presence of aortocoronary bypass graft: Secondary | ICD-10-CM

## 2016-02-06 ENCOUNTER — Ambulatory Visit (INDEPENDENT_AMBULATORY_CARE_PROVIDER_SITE_OTHER): Payer: Self-pay | Admitting: Thoracic Surgery (Cardiothoracic Vascular Surgery)

## 2016-02-06 ENCOUNTER — Ambulatory Visit
Admission: RE | Admit: 2016-02-06 | Discharge: 2016-02-06 | Disposition: A | Discharge status: Home / Self Care | Payer: No Typology Code available for payment source | Source: Ambulatory Visit | Attending: Thoracic Surgery (Cardiothoracic Vascular Surgery) | Admitting: Thoracic Surgery (Cardiothoracic Vascular Surgery)

## 2016-02-06 ENCOUNTER — Encounter: Payer: Self-pay | Admitting: Thoracic Surgery (Cardiothoracic Vascular Surgery)

## 2016-02-06 VITALS — BP 107/66 | HR 90 | Resp 16 | Ht 69.0 in | Wt 151.0 lb

## 2016-02-06 DIAGNOSIS — Z951 Presence of aortocoronary bypass graft: Secondary | ICD-10-CM

## 2016-02-06 NOTE — Progress Notes (Signed)
[address]       [place] [address]             [phone]       HPI: [name] returns today for scheduled postoperative follow-up visit.   He is a 55 year old Hispanic male who presented with chest pain and ruled in for non-ST elevation MI and catheterization he was found to have severe three-vessel disease. He underwent coronary bypass grafting 5 on 01/01/2016. His postoperative course was unremarkable and he went home on postoperative day #4. He was diagnosed with type 2 diabetes while in the hospital.  Of note he did have severely and diffusely diseased coronary arteries.  He was having a lot of pain initially, but that is better. He does say that the cold yesterday made the pain worse. He has been taking one oxycodone a day for the past several days. Prior to that he was using it frequently. He has not had any anginal pain or shortness of breath. He is walking on a regular basis and says that is getting easier. He hasn't had much of an appetite and says the food tastes bad without salt.  Past Medical History:  Diagnosis Date  . Cardiomyopathy ([place])   . Coronary artery disease    a. 12/2015: NSTEMI w/ cath showing severe multivessel disease. CABG recommended. b. CABG on 01/01/2016 w/ LIMA-LAD, SVG-RCA-PDA, SVG-D1, and Left Radial-OM1.  . Diabetes mellitus without complication ([place])    a. A1c elevated to 11.7 in 12/2015.  Marland Kitchen HLD (hyperlipidemia)   . NSTEMI (non-ST elevated myocardial infarction) [facility])      Current Outpatient Prescriptions  Medication Sig Dispense Refill  . aspirin EC 325 MG EC tablet Take 1 tablet (325 mg total) by mouth daily. 30 tablet 0  . atorvastatin (LIPITOR) 40 MG tablet Take 1 tablet (40 mg total) by mouth daily at 6 PM. 30 tablet 3  . blood glucose meter kit and supplies Dispense based on patient and insurance preference. Use up to four times daily as directed. (FOR ICD-9 250.00, 250.01). 1 each 0  . furosemide  (LASIX) 20 MG tablet Take 1 tablet (20 mg total) by mouth daily as needed. 30 tablet 6  . isosorbide mononitrate (IMDUR) 30 MG 24 hr tablet Take 30 mg by mouth daily.    Marland Kitchen lisinopril (PRINIVIL,ZESTRIL) 5 MG tablet Take 0.5 tablets (2.5 mg total) by mouth daily. 30 tablet 3  . metFORMIN (GLUCOPHAGE) 500 MG tablet Take 1 tablet (500 mg total) by mouth 2 (two) times daily with a meal. 60 tablet 3  . metoprolol tartrate (LOPRESSOR) 25 MG tablet Take 0.5 tablets (12.5 mg total) by mouth 2 (two) times daily. 60 tablet 3  . oxyCODONE (OXY IR/ROXICODONE) 5 MG immediate release tablet Take 1-2 tablets (5-10 mg total) by mouth every 6 (six) hours as needed for severe pain. 30 tablet 0   No current facility-administered medications for this visit.     Physical Exam BP 107/66   Pulse 90   Resp 16   Ht 5' 9"  (1.753 m)   Wt 151 lb (68.5 kg)   SpO2 96% Comment: RA  BMI 22.52 kg/m  55 year old man in no acute distress Alert and oriented 3 with no focal deficits Cardiac regular rate and rhythm normal S1 and S2 Sternal incision clean dry and intact, sternum stable Lungs clear with equal breath sounds bilaterally Leg incisions well healed No peripheral edema  Diagnostic Tests: CHEST  2 VIEW  COMPARISON:  January 04, 2016  FINDINGS: There is minimal residual left pleural effusion. Lungs elsewhere clear. Heart size and pulmonary vascularity are normal. No adenopathy. Patient is status post coronary artery bypass grafting. No pneumothorax. No bone lesions.  IMPRESSION: Minimal left pleural effusion. No edema or consolidation. Cardiac silhouette within normal limits.   Electronically Signed   By: Lowella Grip III M.D.   On: 02/06/2016 10:41  I personally reviewed the chest x-ray and concur with the findings noted above.  Impression: 55 year old man who underwent coronary bypass grafting 5 after presenting with a non-ST elevation MI. His postoperative course has been  uncomplicated. He does still have some incisional pain, which is not unusual, particularly for a younger male. I encouraged him to try using Tylenol or Advil and weaning himself off of the oxycodone. I'm not too concerned since he is down to 1 oxycodone tablet day already.  He should not lift anything over 10 pounds for another 2 weeks. Beyond that he can resume normal activities as tolerated. He should not return to work until after the first of the year.  He has severe diffuse native vessel coronary disease. The importance of diet, exercise, medication compliance, and control of his diabetes was emphasized. He is a poor candidate for redo grafting and likely would have limited percutaneous options as well. Therefore, he needs to make this last as long as possible.  He has met with the diabetes team.  Plan: I will be happy to see Mr Kurt Mathis back at any time if I can be of any further assistance with his care.  [name], MD [facility] [phone]

## 2016-02-16 ENCOUNTER — Ambulatory Visit (INDEPENDENT_AMBULATORY_CARE_PROVIDER_SITE_OTHER): Payer: Self-pay | Admitting: Internal Medicine

## 2016-02-16 VITALS — BP 112/72 | HR 93 | Temp 97.4°F | Wt 148.4 lb

## 2016-02-16 DIAGNOSIS — E118 Type 2 diabetes mellitus with unspecified complications: Principal | ICD-10-CM

## 2016-02-16 DIAGNOSIS — IMO0002 Reserved for concepts with insufficient information to code with codable children: Secondary | ICD-10-CM

## 2016-02-16 DIAGNOSIS — Z951 Presence of aortocoronary bypass graft: Secondary | ICD-10-CM

## 2016-02-16 DIAGNOSIS — Z7984 Long term (current) use of oral hypoglycemic drugs: Secondary | ICD-10-CM

## 2016-02-16 DIAGNOSIS — Z7982 Long term (current) use of aspirin: Secondary | ICD-10-CM

## 2016-02-16 DIAGNOSIS — Z0000 Encounter for general adult medical examination without abnormal findings: Secondary | ICD-10-CM | POA: Insufficient documentation

## 2016-02-16 DIAGNOSIS — Z23 Encounter for immunization: Secondary | ICD-10-CM

## 2016-02-16 DIAGNOSIS — D473 Essential (hemorrhagic) thrombocythemia: Secondary | ICD-10-CM

## 2016-02-16 DIAGNOSIS — E119 Type 2 diabetes mellitus without complications: Secondary | ICD-10-CM

## 2016-02-16 DIAGNOSIS — I10 Essential (primary) hypertension: Secondary | ICD-10-CM

## 2016-02-16 DIAGNOSIS — Z79899 Other long term (current) drug therapy: Secondary | ICD-10-CM

## 2016-02-16 DIAGNOSIS — E1165 Type 2 diabetes mellitus with hyperglycemia: Secondary | ICD-10-CM

## 2016-02-16 DIAGNOSIS — D75839 Thrombocytosis, unspecified: Secondary | ICD-10-CM | POA: Insufficient documentation

## 2016-02-16 LAB — POCT GLYCOSYLATED HEMOGLOBIN (HGB A1C): Hemoglobin A1C: 7

## 2016-02-16 LAB — GLUCOSE, CAPILLARY: Glucose-Capillary: 129 mg/dL — ABNORMAL HIGH (ref 65–99)

## 2016-02-16 NOTE — Patient Instructions (Addendum)
Please continue to take your medications as prescribed, exercise/walk regularly, and continue to watch your diet. Your blood pressure is very well controlled and actually a little on the low-side, you can likely add a little more salt to your diet.   You can stop taking the isosorbide mononitrate (Imdur) if you have not done so already. This may improve your dizziness. If you find that you develop chest pressure after this - restart this medication.  We will call you if any of your lab work come back abnormal.  Congratulations on making such good progress on controlling your diabetes.  See you in about 3 months!

## 2016-02-16 NOTE — Progress Notes (Addendum)
   CC: Healthcare maintenance visit  HPI:  [name] is a 55 y.o. male with PMHx detailed below presenting for regular healthcare visit with no acute complaints.  See problem based assessment and plan below for additional details.  Past Medical History:  Diagnosis Date  . Cardiomyopathy ([place])   . Coronary artery disease    a. 12/2015: NSTEMI w/ cath showing severe multivessel disease. CABG recommended. b. CABG on 01/01/2016 w/ LIMA-LAD, SVG-RCA-PDA, SVG-D1, and Left Radial-OM1.  . Diabetes mellitus without complication ([place])    a. A1c elevated to 11.7 in 12/2015.  Marland Kitchen HLD (hyperlipidemia)   . NSTEMI (non-ST elevated myocardial infarction) ([place])     Review of Systems: Review of Systems  Constitutional: Positive for malaise/fatigue. Negative for chills, fever and weight loss.  Respiratory: Positive for shortness of breath. Negative for cough, hemoptysis and wheezing.   Cardiovascular: Negative for chest pain, palpitations, orthopnea and leg swelling.  Gastrointestinal: Positive for constipation. Negative for abdominal pain, nausea and vomiting.  Musculoskeletal: Positive for joint pain.  Neurological: Positive for dizziness. Negative for loss of consciousness.  All other systems reviewed and are negative.    Physical Exam: Vitals:   02/16/16 1454  BP: 112/72  Pulse: 93  Temp: 97.4 F (36.3 C)  TempSrc: Oral  SpO2: 99%  Weight: 148 lb 6.4 oz (67.3 kg)   Body mass index is 21.91 kg/m. GENERAL- Thin man sitting comfortably in exam room chair, alert, in no distress, conversational HEENT- Atraumatic, PERRL, EOMI, moist mucous membranes, adequate dentition CARDIAC- Regular rate and rhythm, no murmurs, rubs or gallops. RESP/CHEST- Clear to ascultation bilaterally, no wheezing or crackles, normal work of breathing, midline sternotomy scar appears well-healed, minimal tenderness with palpation ABDOMEN- Normoactive bowel sounds, soft, nontender,  nondistended EXTREMITIES- Normal bulk and range of motion, no edema, 2+ peripheral pulses, well-healed vein graft scars of LUE, RLE, LLE SKIN- Warm, dry, intact, without visible rash PSYCH- Appropriate affect, clear speech, thoughts linear and goal-directed  Assessment & Plan:   See encounters tab for problem based medical decision making.  Patient seen with Dr. Evette Doffing

## 2016-02-16 NOTE — Assessment & Plan Note (Addendum)
Healing up well from surgery in late September. Recently had visit post-op visit with Dr. Roxan Hockey of CT surg. Continues to experience mild shortness of breath with walking long distances but does not have to stop, is able to pace himself and continue. He report his walking improving greatly since leaving the hospital. He continues to take approx 1 Roxicodone daily for post-op chest wall discomfort, which does not coincide with activity but rather is most prominent when he is laying down for sleep. Certain movements bring about this pain in his chest wall which tends to be fleeting/aching in quality. Pain reports not having tried Tylenol or Ibuprofen and stated that he planned to finish his daily oxycodone before trying these medications. Advised to stop this medication altogether and to use Tylenol or Ibuprofen instead. Patient has been very motivated and is adhering to medications and suggested lifestyle modifications with enthusiasm. Exam unremarkable, incision scar well healed with minimal tenderness to palpation/pressure. No issues with anginal-sounding chest pain or shortness of breath.  Plan: - Continue current medications (Asa 325, Lipitor 40, Lisinopril 2.5, Metop 12.5 BID), can trial discontinue Imdur as patient with orthostatic symptoms, persistently hypotensive/low-normal pressures at recent clinic visits. - Advised to resume Imdur if chest pressure symptoms recur - Advised to take Tylenol or Ibuprofen for MSK chest pain, avoid oxycodone - Follow CT surg recommendations, lifting restrictions for another week, can resume work 04/2016

## 2016-02-16 NOTE — Assessment & Plan Note (Addendum)
Provided flu vaccine, obtained HCV, HIV  Also obtained BMP to assess Cr, last checked one month ago and trending up

## 2016-02-16 NOTE — Assessment & Plan Note (Signed)
Patient concerned, recently told that "something is wrong with my blood." Recent concern for thrombocytosis following recent CABGx5 surgery on 01/01/16. Experienced post-op Hb drop to 6.3 and received 2 units pRBCs which brought him up to Hb 9.2. Since then his hemoglobin gradually increased toward normal range and coincides with this new mild thrombocytosis. 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  Plan:  - Repeat CDC with diff today to assess for persistent thrombocytosis, depending on value will monitor regular CBCs vs pursue JAK2 testing.for essential thrombocythemia.  Recent CBCs: CBC Latest Ref Rng & Units 01/22/2016 01/06/2016 01/04/2016  WBC 3.8 - 10.8 K/uL 6.9 8.4 11.8(H)  Hemoglobin 13.2 - 17.1 g/dL 12.0(L) 10.2(L) 9.2(L)  Hematocrit 38.5 - 50.0 % 36.5(L) 30.8(L) 27.7(L)  Platelets 140 - 400 K/uL 509(H) 402(H) 234

## 2016-02-16 NOTE — Assessment & Plan Note (Signed)
Hypotensive to low-normal blood pressures at recent clinic visits. Endorsing orthostatic symptoms and often dizzy when standing from sitting. Pressure 112/72 today. Taking Lisinopril 2.5, Metop 12.5 BID, Imdur 30 - no Lasix. Also adhering to a very bland diet devoid of salt - and experiencing minimal enjoyment from his meals.   Plan: - Advised that he can try discontinuing the Imdur to see if this helps with his orthostatic symptoms, but advised to restart if anginal symptoms recur - Congratulated on adhering to such strict dietary modifications, however given his hypotension and unhappiness with bland diet, advised that moderate increase in his salt intake would be fine. Improve QOL and unlikely to cause hypertension.

## 2016-02-16 NOTE — Assessment & Plan Note (Addendum)
Diagnosed at hospitalization, found to have HbA1c 12.0 on 12/23/2015. Was started on Metformin 500 mg BID shortly thereafter. The patient has also been adhering to a very bland diet, minimal carbohydrates/sugar/processed food and little to no salt. Reports need to take Metformin with food otherwise he develops N/V. He is staying active and going for walks as tolerated. Patient and wife with limited understanding of normal glucose range and daily fluctuations. They have been checking CBGs 2-4x daily and agonizing over small fluctuations, very concerned about values as low as 98. Similarly with the 2-3 instances of 220+ in the past several weeks. Overall well controlled and in the mid-100s throughout the day. HbA1c 7.0 on check today. Patient congratulated on progress he has made in getting his diabetes under control.   Plan: - Continue Metformin 500 BID - Encouraged continued diet modification and exercise - Advised that checking multiple daily CBGs is not necessary, and this practice can be discontinued - Recheck A1c in 3 months

## 2016-02-17 LAB — CBC WITH DIFFERENTIAL/PLATELET
BASOS ABS: 0 10*3/uL (ref 0.0–0.2)
Basos: 0 %
EOS (ABSOLUTE): 0.2 10*3/uL (ref 0.0–0.4)
Eos: 3 %
HEMOGLOBIN: 13.6 g/dL (ref 12.6–17.7)
Hematocrit: 39.5 % (ref 37.5–51.0)
Immature Grans (Abs): 0 10*3/uL (ref 0.0–0.1)
Immature Granulocytes: 0 %
LYMPHS ABS: 3.5 10*3/uL — AB (ref 0.7–3.1)
Lymphs: 41 %
MCH: 29.8 pg (ref 26.6–33.0)
MCHC: 34.4 g/dL (ref 31.5–35.7)
MCV: 87 fL (ref 79–97)
MONOCYTES: 9 %
MONOS ABS: 0.7 10*3/uL (ref 0.1–0.9)
Neutrophils Absolute: 3.9 10*3/uL (ref 1.4–7.0)
Neutrophils: 47 %
PLATELETS: 388 10*3/uL — AB (ref 150–379)
RBC: 4.56 x10E6/uL (ref 4.14–5.80)
RDW: 14.9 % (ref 12.3–15.4)
WBC: 8.4 10*3/uL (ref 3.4–10.8)

## 2016-02-17 LAB — HEPATITIS C ANTIBODY

## 2016-02-17 LAB — BMP8+ANION GAP
Anion Gap: 16 mmol/L (ref 10.0–18.0)
BUN / CREAT RATIO: 22 — AB (ref 9–20)
BUN: 18 mg/dL (ref 6–24)
CHLORIDE: 96 mmol/L (ref 96–106)
CO2: 25 mmol/L (ref 18–29)
Calcium: 9.8 mg/dL (ref 8.7–10.2)
Creatinine, Ser: 0.81 mg/dL (ref 0.76–1.27)
GFR calc Af Amer: 116 mL/min/{1.73_m2} (ref >59)
GFR calc non Af Amer: 100 mL/min/{1.73_m2} (ref >59)
GLUCOSE: 123 mg/dL — AB (ref 65–99)
POTASSIUM: 5.2 mmol/L (ref 3.5–5.2)
Sodium: 137 mmol/L (ref 134–144)

## 2016-02-17 LAB — HIV ANTIBODY (ROUTINE TESTING W REFLEX): HIV SCREEN 4TH GENERATION: NONREACTIVE

## 2016-02-20 NOTE — Progress Notes (Signed)
Internal Medicine Clinic Attending  I saw and evaluated the patient.  I personally confirmed the key portions of the history and exam documented by Dr. Johnson and I reviewed pertinent patient test results.  The assessment, diagnosis, and plan were formulated together and I agree with the documentation in the resident's note.  

## 2016-03-18 ENCOUNTER — Encounter: Payer: Self-pay | Admitting: Cardiovascular Disease

## 2016-03-18 ENCOUNTER — Ambulatory Visit (INDEPENDENT_AMBULATORY_CARE_PROVIDER_SITE_OTHER): Payer: Self-pay | Admitting: Cardiovascular Disease

## 2016-03-18 VITALS — BP 100/70 | HR 80 | Ht 71.0 in | Wt 153.0 lb

## 2016-03-18 DIAGNOSIS — I952 Hypotension due to drugs: Secondary | ICD-10-CM

## 2016-03-18 DIAGNOSIS — E119 Type 2 diabetes mellitus without complications: Secondary | ICD-10-CM

## 2016-03-18 DIAGNOSIS — I2510 Atherosclerotic heart disease of native coronary artery without angina pectoris: Secondary | ICD-10-CM

## 2016-03-18 DIAGNOSIS — I5032 Chronic diastolic (congestive) heart failure: Secondary | ICD-10-CM

## 2016-03-18 MED ORDER — FAMOTIDINE 20 MG PO TABS: 20.0000 mg | ORAL_TABLET | Freq: Two times a day (BID) | ORAL | 11 refills | Status: DC

## 2016-03-18 NOTE — Patient Instructions (Signed)
Medication Instructions: Dr Sallyanne Kuster has recommended making the following medication changes: 1. START Pepcid 20 mg - take 1 tablet by mouth twice daily 2. STOP Furosemide 3. STOP Lisinopril 4. STOP Isosorbide  Labwork: NONE ORDERED  Testing/Procedures: 1. Echocardiogram - Your physician has requested that you have an echocardiogram. Echocardiography is a painless test that uses sound waves to create images of your heart. It provides your doctor with information about the size and shape of your heart and how well your heart's chambers and valves are working. This procedure takes approximately one hour. There are no restrictions for this procedure. This will be performed at our [facility] location - [address], [address].  Follow-up: Dr Sallyanne Kuster recommends that you schedule a follow-up appointment in 2-3 months.  If you need a refill on your cardiac medications before your next appointment, please call your pharmacy.

## 2016-03-18 NOTE — Progress Notes (Signed)
Cardiology Office Note    Date:  03/19/2016   ID:  Macari Zalesky, DOB [birth year], MRN [id number]  PCP:  Asencion Partridge, MD  Cardiologist:   Sanda Klein, MD   Chief Complaint  Patient presents with  . Follow-up    pt c/o throat pain     History of Present Illness:  Kurt Mathis is a 55 y.o. male imaging for his first office visit after hospitalization for non-STEMI and bypass surgery. He was also diagnosed for the first time with diabetes mellitus on that same occasion.  Anginal complaint seems to be described as a sensation of cold in the retrosternal area. Lung discharge from the hospital he has had some recurrence of that sensation, but it is brought on by bending over or by drinking cold liquids. It is not clearly associated with physical activity but did occur at least once during walking. Overall he is feeling much better, he has lost substantial weight, is reasonably active and seems to have better glycemic control. All the surgical wounds(sternotomy, left radial harvest, SVG harvest) have healed well. Due to financial issues he has not been able to enroll in cardiac rehabilitation. He has lost 15 pounds since hospital admission. His last hemoglobin A1c decreased to 7% (preop 11.7%). He is currently on metformin monotherapy. He reports compliance with aspirin, statin and beta blocker. He is still taking lisinopril but thinks this medicine is causing side effects. Recently his potassium was borderline elevated at 5.2. Blood pressure is relatively low today at 100/70. Occasional orthostatic dizziness.  Admitted on Sep 16 with a small non-STEMI symptoms of decompensated heart failure (orthopnea). At that time he weighed 168 kg. His hemoglobin A1c was 11.7% (new Dx of DM). Troponin peak was 7. Showed EF 45-50% with hypokinesis in the distribution of the right coronary artery and grade 2 diastolic dysfunction. Sep 18 cath showed severe multivessel disease including total  occlusion of the proximal left circumflex coronary artery, severe diffuse disease throughout the right coronary artery, 60-70&% mid LAD stenosis proven to be significant by FFR and severe stenosis in a large diagonal artery. On Sep 25 Dr Roxan Hockey performed bypass surgery 5 (LIMA to LAD, sequential SVG to RCA and PDA, SVG to diagonal, left radial to OM1). Posttop course was uncomplicated and he was discharged on Sep 29.  Past Medical History:  Diagnosis Date  . Cardiomyopathy ([place])   . Coronary artery disease    a. 12/2015: NSTEMI w/ cath showing severe multivessel disease. CABG recommended. b. CABG on 01/01/2016 w/ LIMA-LAD, SVG-RCA-PDA, SVG-D1, and Left Radial-OM1.  . Diabetes mellitus without complication ([place])    a. A1c elevated to 11.7 in 12/2015.  Marland Kitchen HLD (hyperlipidemia)   . NSTEMI (non-ST elevated myocardial infarction) [facility])     Past Surgical History:  Procedure Laterality Date  . CARDIAC CATHETERIZATION N/A 12/25/2015   Procedure: Left Heart Cath and Coronary Angiography;  Surgeon: Belva Crome, MD;  Location: [place] [facility];  Service: Cardiovascular;  Laterality: N/A;  . CARDIAC CATHETERIZATION N/A 12/26/2015   Procedure: Intravascular Pressure Wire/FFR Study;  Surgeon: Burnell Blanks, MD;  Location: [place] [facility];  Service: Cardiovascular;  Laterality: N/A;  . CORONARY ARTERY BYPASS GRAFT N/A 01/01/2016   Procedure: CORONARY ARTERY BYPASS GRAFTING (CABG), ON PUMP, TIMES 5, USING LEFT RADIAL ARTERY AND LEFT INTERNAL MAMMARY ARTERY, BILATERAL GREATER SAPHENOUS VEINS HARVESTED ENDOSCOPICALLY;  Surgeon: [name], MD;  Location: [place];  Service: Open Heart Surgery;  Laterality: N/A;  LIMA-LAD; SEQ  SVG-RCA-PD; SVG-DIAG; LEFT RADIAL-OM  . FINGER SURGERY Right   . RADIAL ARTERY HARVEST Left 01/01/2016   Procedure: LEFT RADIAL ARTERY HARVEST;  Surgeon: [name], MD;  Location: [place];  Service: Open Heart Surgery;  Laterality: Left;  . TEE WITHOUT  CARDIOVERSION N/A 01/01/2016   Procedure: TRANSESOPHAGEAL ECHOCARDIOGRAM (TEE);  Surgeon: [name], MD;  Location: [place];  Service: Open Heart Surgery;  Laterality: N/A;    Current Medications: Outpatient Medications Prior to Visit  Medication Sig Dispense Refill  . aspirin EC 325 MG EC tablet Take 1 tablet (325 mg total) by mouth daily. 30 tablet 0  . atorvastatin (LIPITOR) 40 MG tablet Take 1 tablet (40 mg total) by mouth daily at 6 PM. 30 tablet 3  . blood glucose meter kit and supplies Dispense based on patient and insurance preference. Use up to four times daily as directed. (FOR ICD-9 250.00, 250.01). 1 each 0  . metFORMIN (GLUCOPHAGE) 500 MG tablet Take 1 tablet (500 mg total) by mouth 2 (two) times daily with a meal. 60 tablet 3  . metoprolol tartrate (LOPRESSOR) 25 MG tablet Take 0.5 tablets (12.5 mg total) by mouth 2 (two) times daily. 60 tablet 3  . lisinopril (PRINIVIL,ZESTRIL) 5 MG tablet Take 0.5 tablets (2.5 mg total) by mouth daily. 30 tablet 3  . furosemide (LASIX) 20 MG tablet Take 1 tablet (20 mg total) by mouth daily as needed. (Patient not taking: Reported on 03/18/2016) 30 tablet 6  . isosorbide mononitrate (IMDUR) 30 MG 24 hr tablet Take 30 mg by mouth daily.     No facility-administered medications prior to visit.      Allergies:   Patient has no known allergies.   Social History   Social History  . Marital status: Married    Spouse name: N/A  . Number of children: N/A  . Years of education: N/A   Social History Main Topics  . Smoking status: Never Smoker  . Smokeless tobacco: Never Used  . Alcohol use Yes     Comment: occasional  . Drug use: No  . Sexual activity: Not Asked   Other Topics Concern  . None   Social History Narrative  . None     Family History:  The patient's \ family history includes Hypertension in his mother.   ROS:   Please see the history of present illness.    ROS All other systems reviewed and are  negative.   PHYSICAL EXAM:   VS:  BP 100/70   Pulse 80   Ht 5' 11"  (1.803 m)   Wt 153 lb (69.4 kg)   SpO2 99%   BMI 21.34 kg/m    GEN: Well nourished, well developed, in no acute distress  HEENT: normal  Neck: no JVD, carotid bruits, or masses Cardiac: \RRR; no murmurs, rubs, or gallops,no edema  Respiratory:  clear to auscultation bilaterally, normal work of breathing GI: soft, nontender, nondistended, + BS MS: no deformity or atrophy  Skin: warm and dry, no rash Neuro:  Alert and Oriented x 3, Strength and sensation are intact Psych: euthymic mood, full affect  Wt Readings from Last 3 Encounters:  03/18/16 153 lb (69.4 kg)  02/16/16 148 lb 6.4 oz (67.3 kg)  02/06/16 151 lb (68.5 kg)      Studies/Labs Reviewed:   EKG:  EKG is ordered today.  The ekg ordered today demonstrates Sinus rhythm, minor nonspecific T-wave changes in leads 1, aVL, V6  Recent Labs: 12/23/2015: ALT  40; TSH 1.550 01/02/2016: Magnesium 2.3 01/22/2016: Hemoglobin 12.0 02/16/2016: BUN 18; Creatinine, Ser 0.81; Platelets 388; Potassium 5.2; Sodium 137   Lipid Panel    Component Value Date/Time   CHOL 171 12/24/2015 1256   TRIG 261 (H) 12/24/2015 1256   HDL 20 (L) 12/24/2015 1256   CHOLHDL 8.6 12/24/2015 1256   VLDL 52 (H) 12/24/2015 1256   LDLCALC 99 12/24/2015 1256    Additional studies/ records that were reviewed today include:  Notes from Dr. Roxan Hockey and Dr. Wynetta Emery    ASSESSMENT:    1. Chronic diastolic heart failure ([place])   2. Coronary artery disease involving native coronary artery of native heart without angina pectoris   3. Diabetes mellitus type 2 in nonobese (HCC)   4. Hypotension due to drugs      PLAN:  In order of problems listed above:  1. CHF: He appears clinically euvolemic and I don't think he needs diuretics anymore. It's quite possible that LV ejection fraction has improved or normalized following revascularization. I love to reevaluate this by echo, but due to  financial constraints he prefers not to have additional testing. I don't think he needs to continue ACE inhibitor therapy with his current blood pressure. He definitely does not need diuretics anymore. 2. CAD s/p CABG: He seems to have recovered very nicely. I wonder if his residual chest discomfort might actually represent gastroesophageal reflux disease, since it is associated with bending over. Recommended daily H2 blocker therapy to see if this leads to improvement. Discussed the need for lifelong control of risk factors, especially good glycemic and cholesterol control to prevent recurrence of coronary problems or other complications of atherosclerosis. 3. DM: He has made a remarkably quick improvement in glycemic control, has lost a lot of weight (may be excessively so) and has essentially reached target levels of hemoglobin A1c. Discussed healthy eating habits today. 4. Hypotension: Borderline low blood pressure today, occasional symptoms of dizziness. He will discontinue isosorbide and lisinopril.    Medication Adjustments/Labs and Tests Ordered: Current medicines are reviewed at length with the patient today.  Concerns regarding medicines are outlined above.  Medication changes, Labs and Tests ordered today are listed in the Patient Instructions below. Patient Instructions  Medication Instructions: Dr Sallyanne Kuster has recommended making the following medication changes: 1. START Pepcid 20 mg - take 1 tablet by mouth twice daily 2. STOP Furosemide 3. STOP Lisinopril 4. STOP Isosorbide  Labwork: NONE ORDERED  Testing/Procedures: 1. Echocardiogram - Your physician has requested that you have an echocardiogram. Echocardiography is a painless test that uses sound waves to create images of your heart. It provides your doctor with information about the size and shape of your heart and how well your heart's chambers and valves are working. This procedure takes approximately one hour. There are no  restrictions for this procedure. This will be performed at our [facility] location - [address].  Follow-up: Dr Sallyanne Kuster recommends that you schedule a follow-up appointment in 2-3 months.  If you need a refill on your cardiac medications before your next appointment, please call your pharmacy.    Signed, Sanda Klein, MD  03/19/2016 3:28 PM    [address], [place], [place]  [address] Phone: [phone]; Fax: [phone]

## 2016-04-11 ENCOUNTER — Ambulatory Visit (HOSPITAL_COMMUNITY): Payer: Self-pay | Attending: Cardiovascular Disease

## 2016-04-11 ENCOUNTER — Other Ambulatory Visit: Payer: Self-pay

## 2016-04-11 DIAGNOSIS — I2510 Atherosclerotic heart disease of native coronary artery without angina pectoris: Secondary | ICD-10-CM | POA: Insufficient documentation

## 2016-04-18 ENCOUNTER — Telehealth: Payer: Self-pay

## 2016-04-18 NOTE — Telephone Encounter (Signed)
Pt had heart surgery on September 27 th 2017. Pt states he was clear to go back to work by his surgeon Dr.Hendrickson. Pt would like for his cardiologist Dr. Sallyanne Kuster  To wright a letter of clearance  with  restrictions if applied so he can go back to work. Pt has an appointment with Dr.C. On February 12/ 2018, but he would like to have the clearance letter before then.  I spoke with MD's nurse regarding this issue. Pt will go to the [place] office to get the letter on Monday 04/22/16. MD's Nurse    is aware.

## 2016-04-18 NOTE — Telephone Encounter (Signed)
Ok to return to work without restrictions [employer]

## 2016-04-18 NOTE — Telephone Encounter (Signed)
Patient called in today requesting a clearance letter to return to work. He states that he is feeling well and ready to return. He has been cleared by [employer] but needs a note from Dr C clearing him to return to work and list any restrictions.  Patient notified to come pick up letter on Monday, 04/22/16.   This information was communicated to and from by help of a spanish speaking operator at our [address] location.

## 2016-04-19 NOTE — Telephone Encounter (Signed)
Letter generated, signed, and placed at front desk for pick-up.

## 2016-04-26 ENCOUNTER — Other Ambulatory Visit: Payer: Self-pay

## 2016-04-26 MED ORDER — ATORVASTATIN CALCIUM 40 MG PO TABS: 40.0000 mg | ORAL_TABLET | Freq: Every day | ORAL | 6 refills | Status: DC

## 2016-05-06 ENCOUNTER — Other Ambulatory Visit: Payer: Self-pay

## 2016-05-06 MED ORDER — METFORMIN HCL 500 MG PO TABS: 500.0000 mg | ORAL_TABLET | Freq: Two times a day (BID) | ORAL | 1 refills | Status: DC

## 2016-05-06 NOTE — Telephone Encounter (Signed)
metFORMIN (GLUCOPHAGE) 500 MG tablet, refill request @ [facility] on [address].

## 2016-05-06 NOTE — Telephone Encounter (Signed)
Rx approved by MD.  rx phoned into pharmacy.Marland KitchenMarland KitchenDespina Hidden Cassady1/29/20184:34 PM

## 2016-05-20 ENCOUNTER — Ambulatory Visit: Payer: Self-pay | Admitting: Cardiovascular Disease

## 2016-05-24 ENCOUNTER — Encounter: Payer: No Typology Code available for payment source | Admitting: Internal Medicine

## 2016-06-07 ENCOUNTER — Telehealth: Payer: Self-pay | Admitting: Cardiovascular Disease

## 2016-06-07 NOTE — Telephone Encounter (Signed)
New message   Pt wife is calling because pt had surgery 5 months ago. She states he has a cough and wants to know what to give him because she doesn't want to give him something to harm him.

## 2016-06-07 NOTE — Telephone Encounter (Signed)
Spoke with pt wife (DPR-ok) she states that pt has had a cough and cold sx(productive cough (clear mucus) and runny nose) x3days she states that he does not have a fever at present, but did start out with a fever 3 days ago. Wife states no other sx. Notified wife to get mucinex and call back(or PCP) if this does not help

## 2016-07-18 ENCOUNTER — Ambulatory Visit: Payer: Self-pay | Admitting: Cardiovascular Disease

## 2016-07-24 ENCOUNTER — Ambulatory Visit (INDEPENDENT_AMBULATORY_CARE_PROVIDER_SITE_OTHER): Payer: Self-pay | Admitting: Cardiology

## 2016-07-24 ENCOUNTER — Encounter: Payer: Self-pay | Admitting: Cardiology

## 2016-07-24 VITALS — BP 124/74 | HR 60 | Ht 70.0 in | Wt 154.0 lb

## 2016-07-24 DIAGNOSIS — G629 Polyneuropathy, unspecified: Secondary | ICD-10-CM

## 2016-07-24 DIAGNOSIS — E119 Type 2 diabetes mellitus without complications: Secondary | ICD-10-CM

## 2016-07-24 DIAGNOSIS — E785 Hyperlipidemia, unspecified: Secondary | ICD-10-CM

## 2016-07-24 DIAGNOSIS — I10 Essential (primary) hypertension: Secondary | ICD-10-CM

## 2016-07-24 DIAGNOSIS — I255 Ischemic cardiomyopathy: Secondary | ICD-10-CM

## 2016-07-24 DIAGNOSIS — Z951 Presence of aortocoronary bypass graft: Secondary | ICD-10-CM

## 2016-07-24 NOTE — Assessment & Plan Note (Signed)
On metformin

## 2016-07-24 NOTE — Assessment & Plan Note (Signed)
CABG x 5 01/01/16 with an LIMA-LAD, SVG-RCA/PDA, SVG-Dx, and LRA to OM1

## 2016-07-24 NOTE — Assessment & Plan Note (Signed)
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

## 2016-07-24 NOTE — Progress Notes (Signed)
07/24/2016 Kurt Mathis   June 08, 1960  [id number]  Primary Physician Asencion Partridge, MD Primary Cardiologist: Dr Sallyanne Kuster  HPI:  56 y.o. male who was admitted 12/23/2015 with NSTEMI. Cardiac catheterization 12/25/15 showed severe diffuse multivessel disease including total occlusion of the ProxCx, severe 85% stenosis of the RCA and 60-70% stenosis in the LAD. His EF was 45-50% at cath. On 01/01/16 he had CABG x 5 with LIMA-LAD, SVG-RCA-PDA, SVG-D1, and Left Radial-OM1. A1c was found to be elevated to 11.7 during that admission and he was started on Metformin. He last saw Dr Sallyanne Kuster in Dec 2017 and was doing well. His EF improved to 50-55% by echo in Jan 2018.  The pt is in the office for routine follow up. 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 He denies chest pain or SOB. He has some numbness in his Lt ankle and foot after a fall at work some months prior. He tells me he had seen a provider for this in Aug, a few weeks before he presented with unstable angina. He did get an MRI 11/20/15 that showed L4-L5 disk herniation. He was placed on medication (? NSAIDs). He has had no further follow for this since.     Current Outpatient Prescriptions  Medication Sig Dispense Refill  . aspirin EC 325 MG EC tablet Take 1 tablet (325 mg total) by mouth daily. 30 tablet 0  . atorvastatin (LIPITOR) 40 MG tablet Take 1 tablet (40 mg total) by mouth daily at 6 PM. 30 tablet 6  . blood glucose meter kit and supplies Dispense based on patient and insurance preference. Use up to four times daily as directed. (FOR ICD-9 250.00, 250.01). 1 each 0  . famotidine (PEPCID) 20 MG tablet Take 1 tablet (20 mg total) by mouth 2 (two) times daily. 60 tablet 11  . metFORMIN (GLUCOPHAGE) 500 MG tablet Take 1 tablet (500 mg total) by mouth 2 (two) times daily with a meal. 180 tablet 1  . metoprolol tartrate (LOPRESSOR) 25 MG tablet Take 0.5 tablets (12.5 mg total) by mouth 2 (two) times  daily. 60 tablet 3   No current facility-administered medications for this visit.     No Known Allergies  Past Medical History:  Diagnosis Date  . Cardiomyopathy ([place])   . Coronary artery disease    a. 12/2015: NSTEMI w/ cath showing severe multivessel disease. CABG recommended. b. CABG on 01/01/2016 w/ LIMA-LAD, SVG-RCA-PDA, SVG-D1, and Left Radial-OM1.  . Diabetes mellitus without complication ([place])    a. A1c elevated to 11.7 in 12/2015.  Marland Kitchen HLD (hyperlipidemia)   . NSTEMI (non-ST elevated myocardial infarction) [facility])     Social History   Social History  . Marital status: Married    Spouse name: N/A  . Number of children: N/A  . Years of education: N/A   Occupational History  . Not on file.   Social History Main Topics  . Smoking status: Never Smoker  . Smokeless tobacco: Never Used  . Alcohol use Yes     Comment: occasional  . Drug use: No  . Sexual activity: Not on file   Other Topics Concern  . Not on file   Social History Narrative  . No narrative on file     Family History  Problem Relation Age of Onset  . Hypertension Mother   . Heart attack Neg Hx      Review of Systems: General: negative for chills, fever, night  sweats or weight changes.  Cardiovascular: negative for chest pain, dyspnea on exertion, edema, orthopnea, palpitations, paroxysmal nocturnal dyspnea or shortness of breath Dermatological: negative for rash Respiratory: negative for cough or wheezing Urologic: negative for hematuria Abdominal: negative for nausea, vomiting, diarrhea, bright red blood per rectum, melena, or hematemesis Neurologic: negative for visual changes, syncope, or dizziness Numbness Lt ankle All other systems reviewed and are otherwise negative except as noted above.    Blood pressure 124/74, pulse 60, height _0  (1.778 m), weight 154 lb (69.9 kg).  General appearance: alert, cooperative and no distress Neck: no carotid bruit and no JVD Lungs: clear to  auscultation bilaterally Heart: regular rate and rhythm Extremities: extremities normal, atraumatic, no cyanosis or edema Pulses: 2+ and symmetric Skin: Skin color, texture, turgor normal. 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 We would prefer to avoid NSAIDs with his CAD, unless its only a short course, but looking at his MRI report I'm not sure that will help him long term. He is doing well from a cardiac standpoint, he can f/u with Dr Sallyanne Kuster in 6 months, check lipids then.   Kerin Ransom PA-C 07/24/2016 12:35 PM

## 2016-07-24 NOTE — Assessment & Plan Note (Signed)
Controlled.  

## 2016-07-24 NOTE — Patient Instructions (Addendum)
Medication Instructions:  Your physician recommends that you continue on your current medications as directed. Please refer to the Current Medication list given to you today.  If you need a refill on your cardiac medications before your next appointment, please call your pharmacy.  Follow-Up: Your physician wants you to follow-up in: 6 months with dr croitoru You will receive a reminder letter in the mail two months in advance. If you don't receive a letter, please call our office AUGUST 2018 to schedule the October 2018 follow-up appointment.  PLEASE CALL AND SCHEDULE FOLLOW UP FOR YOUR LEGS AND OK TO TAKE TYLENOL AS NEEDED FOR YOUR PAIN   Thank you for choosing [facility] at [facility]!!

## 2016-07-24 NOTE — Assessment & Plan Note (Signed)
On statin Rx 

## 2016-07-24 NOTE — Assessment & Plan Note (Signed)
EF normalized on echo jan 2018

## 2016-09-16 ENCOUNTER — Encounter: Payer: Self-pay | Admitting: *Deleted

## 2016-10-02 ENCOUNTER — Ambulatory Visit: Payer: No Typology Code available for payment source

## 2016-10-03 ENCOUNTER — Encounter: Payer: Self-pay | Admitting: Internal Medicine

## 2016-10-30 ENCOUNTER — Ambulatory Visit (INDEPENDENT_AMBULATORY_CARE_PROVIDER_SITE_OTHER): Payer: Self-pay | Admitting: Internal Medicine

## 2016-10-30 VITALS — BP 125/65 | HR 62 | Temp 97.4°F | Ht 70.0 in | Wt 160.3 lb

## 2016-10-30 DIAGNOSIS — Z951 Presence of aortocoronary bypass graft: Secondary | ICD-10-CM

## 2016-10-30 DIAGNOSIS — I2510 Atherosclerotic heart disease of native coronary artery without angina pectoris: Secondary | ICD-10-CM

## 2016-10-30 DIAGNOSIS — I214 Non-ST elevation (NSTEMI) myocardial infarction: Secondary | ICD-10-CM

## 2016-10-30 DIAGNOSIS — Z79899 Other long term (current) drug therapy: Secondary | ICD-10-CM

## 2016-10-30 DIAGNOSIS — I252 Old myocardial infarction: Secondary | ICD-10-CM

## 2016-10-30 DIAGNOSIS — Z7984 Long term (current) use of oral hypoglycemic drugs: Secondary | ICD-10-CM

## 2016-10-30 DIAGNOSIS — E119 Type 2 diabetes mellitus without complications: Secondary | ICD-10-CM

## 2016-10-30 DIAGNOSIS — Z7982 Long term (current) use of aspirin: Secondary | ICD-10-CM

## 2016-10-30 DIAGNOSIS — Z0000 Encounter for general adult medical examination without abnormal findings: Secondary | ICD-10-CM

## 2016-10-30 DIAGNOSIS — Z23 Encounter for immunization: Secondary | ICD-10-CM

## 2016-10-30 DIAGNOSIS — I10 Essential (primary) hypertension: Secondary | ICD-10-CM

## 2016-10-30 LAB — POCT GLYCOSYLATED HEMOGLOBIN (HGB A1C): Hemoglobin A1C: 6.7

## 2016-10-30 LAB — GLUCOSE, CAPILLARY: Glucose-Capillary: 123 mg/dL — ABNORMAL HIGH (ref 65–99)

## 2016-10-30 MED ORDER — ATORVASTATIN CALCIUM 40 MG PO TABS: 40.0000 mg | ORAL_TABLET | Freq: Every day | ORAL | 6 refills | Status: DC

## 2016-10-30 MED ORDER — METFORMIN HCL 500 MG PO TABS: 500.0000 mg | ORAL_TABLET | Freq: Two times a day (BID) | ORAL | 1 refills | Status: DC

## 2016-10-30 MED ORDER — METOPROLOL TARTRATE 25 MG PO TABS: 12.5000 mg | ORAL_TABLET | Freq: Two times a day (BID) | ORAL | 3 refills | Status: DC

## 2016-10-30 NOTE — Progress Notes (Signed)
   CC: T2DM follow up  HPI:  Kurt Mathis is a 56 y.o. with MPH of CAD and NSTEMI s/p CABG in 2017, HLD, and T2DM who presents today for follow of his chronic medical conditions. He has not been seen in the clinic since 02/2016 but has been followed by cardiology for the past few months. Since his last visit the patient has felt well and denies any new complaints. 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 He doesn't have any new chest pain, chest pain with exertion or worsening shortness of breath. The patient and his family have worked hard to change the way he eats at home and tries to maintain a heart health low sodium and low carbohydrate diet. He states that he sometimes cannot stick to this diet, as his long work hours sometimes for him to eat at [employer] where he works.  Past Medical History:  Diagnosis Date  . Cardiomyopathy ([place])   . Coronary artery disease    a. 12/2015: NSTEMI w/ cath showing severe multivessel disease. CABG recommended. b. CABG on 01/01/2016 w/ LIMA-LAD, SVG-RCA-PDA, SVG-D1, and Left Radial-OM1.  . Diabetes mellitus without complication ([place])    a. A1c elevated to 11.7 in 12/2015.  Marland Kitchen HLD (hyperlipidemia)   . NSTEMI (non-ST elevated myocardial infarction) ([place])    Review of Systems:   Patient denies abdominal pain, changes in bowel or bladder habits, fevers, and unintentional weight loss.  Physical Exam:  Vitals:   10/30/16 1449  BP: 125/65  Pulse: 62  Temp: (!) 97.4 F (36.3 C)  TempSrc: Oral  SpO2: 99%  Weight: 160 lb 4.8 oz (72.7 kg)  Height: 5\' 10"  (1.778 m)   Physical Exam  Constitutional: He appears well-developed and well-nourished. No distress.  Cardiovascular: Normal rate, regular rhythm, normal heart sounds and intact distal pulses.  Exam reveals no friction rub.   No murmur heard. Pulmonary/Chest: Effort normal and breath  sounds normal. No respiratory distress. He has no wheezes.  No crackles appreciated  Abdominal: Soft. He exhibits no distension. There is no tenderness.  Musculoskeletal: He exhibits no edema (of lower extremities bilaterally) or tenderness (of lower extremities bilaterally).  Skin: Skin is warm and dry. Capillary refill takes less than 2 seconds. No erythema.  Psychiatric: He has a normal mood and affect. Judgment and thought content normal.    Assessment & Plan:   See Encounters Tab for problem based charting.  Patient seen with Dr. Evette Doffing.

## 2016-10-30 NOTE — Assessment & Plan Note (Signed)
Patient denies any chest pain with exertion and shortness of breath. He is currently taking aspirin, atorvastatin, and metoprolol and tolerating these medications well. Per chart review his lisinopril was discontinued 2/2 hypotension. He will continue with the current regimen and was encouraged to continue follow up with his cardiologist.    Plan -Continue aspirin 325 mg daily -Continue atorvastatin 40 mg daily

## 2016-10-30 NOTE — Patient Instructions (Addendum)
Thank you for seeing [place] today.   You are doing a great job with your diabetes! Your A1C was 6.7% today. Please keep taking your metformin twice a day.   Please continue taking your heart medications and follow up with your heart doctor as previously prescribed.  Please come back to see your PCP in 3-6 months.

## 2016-10-30 NOTE — Assessment & Plan Note (Addendum)
The patient's POC A1C was 6.7% today, which is below his last A1C of 7.0% in 02/2017 and below goal of 7-8%. The patient takes his blood sugars daily and his log shows values ranging between 115-148 (ave 131 with SD 10). The patient has been well controlled on metformin 500 mg BID and this regimen was continued today. The patient was due for [facility] and this was obtained today. The patient will follow up with his PCP in 3 months for management of this chronic medical condition. At follow up visit, consider decreasing to 500 mg metformin daily since A1C was <7% today.  Plan: -Continue metformin 500 mg BID -Follow up [facility] -Follow up with PCP in 3 months to discuss lowering metformin dose

## 2016-10-30 NOTE — Assessment & Plan Note (Signed)
Patient's BP at goal of <140/90 today. Plan to continue current regimen given that this problem is well controlled.  Plan: Continue metoprolol 12.5 mg BID

## 2016-10-31 LAB — MICROALBUMIN / CREATININE URINE RATIO
Creatinine, Urine: 101.6 mg/dL
Microalb/Creat Ratio: 3.6 mg/g{creat} (ref 0.0–30.0)
Microalbumin, Urine: 3.7 ug/mL

## 2016-10-31 NOTE — Progress Notes (Signed)
Internal Medicine Clinic Attending  I saw and evaluated the patient.  I personally confirmed the key portions of the history and exam documented by Dr. Nedrud and I reviewed pertinent patient test results.  The assessment, diagnosis, and plan were formulated together and I agree with the documentation in the resident's note.  

## 2017-02-05 ENCOUNTER — Ambulatory Visit (INDEPENDENT_AMBULATORY_CARE_PROVIDER_SITE_OTHER): Payer: Self-pay | Admitting: Cardiology

## 2017-02-05 ENCOUNTER — Encounter: Payer: Self-pay | Admitting: Cardiology

## 2017-02-05 VITALS — BP 116/60 | HR 64 | Resp 16 | Ht 71.0 in | Wt 168.0 lb

## 2017-02-05 DIAGNOSIS — E785 Hyperlipidemia, unspecified: Secondary | ICD-10-CM

## 2017-02-05 DIAGNOSIS — I10 Essential (primary) hypertension: Secondary | ICD-10-CM

## 2017-02-05 MED ORDER — PANTOPRAZOLE SODIUM 40 MG PO TBEC: 40.0000 mg | DELAYED_RELEASE_TABLET | Freq: Every day | ORAL | 1 refills | Status: DC

## 2017-02-05 NOTE — Patient Instructions (Addendum)
Medication Instructions:  START- Pantoprazole 40 mg daily  If you need a refill on your cardiac medications before your next appointment, please call your pharmacy.  Labwork: Fasting Lipids and CMP  Testing/Procedures: None Ordered  Follow-Up: Your physician wants you to follow-up in: 6 Months with Dr Sallyanne Kuster. You should receive a reminder letter in the mail two months in advance. If you do not receive a letter, please call our office [phone].    Thank you for choosing [facility] at [facility]!!

## 2017-02-05 NOTE — Progress Notes (Signed)
02/05/2017 Kurt Mathis   1960/10/13  [id number]  Primary Physician Welford Roche, MD Primary Cardiologist: Dr Sallyanne Kuster  HPI:  57 y.o.malewho was admitted 12/23/2015 with NSTEMI. Cardiac catheterization showed severe diffuse multivessel disease. His EF was 45-50% at cath, (50-55% by echo Jan 2018). On 01/01/16 he had CABG x 5 with LIMA-LAD, SVG-RCA-PDA, SVG-D1, and Left Radial-OM1. A1c was found to be elevated to 11.7 during that admission and he was started on Metformin. He is in the office today with his wife and child for a 6 month f/u. He has been doing well from a cardiac standpoint, no chest pain or unusual dyspnea. He is still working 12 hr days at the [employer]. He had some radiculopathy last time I saw him but this apparently has quitted down. We had placed him on Pepcid for GERD and he says that worked for a few months but now doesn't work as well.    Current Outpatient Prescriptions  Medication Sig Dispense Refill  . aspirin EC 325 MG EC tablet Take 1 tablet (325 mg total) by mouth daily. 30 tablet 0  . atorvastatin (LIPITOR) 40 MG tablet Take 1 tablet (40 mg total) by mouth daily at 6 PM. 30 tablet 6  . blood glucose meter kit and supplies Dispense based on patient and insurance preference. Use up to four times daily as directed. (FOR ICD-9 250.00, 250.01). 1 each 0  . metFORMIN (GLUCOPHAGE) 500 MG tablet Take 1 tablet (500 mg total) by mouth 2 (two) times daily with a meal. 180 tablet 1  . metoprolol tartrate (LOPRESSOR) 25 MG tablet Take 0.5 tablets (12.5 mg total) by mouth 2 (two) times daily. 60 tablet 3   No current facility-administered medications for this visit.     No Known Allergies  Past Medical History:  Diagnosis Date  . Cardiomyopathy ([place])   . Coronary artery disease    a. 12/2015: NSTEMI w/ cath showing severe multivessel disease. CABG recommended. b. CABG on 01/01/2016 w/ LIMA-LAD, SVG-RCA-PDA, SVG-D1, and Left Radial-OM1.  .  Diabetes mellitus without complication ([place])    a. A1c elevated to 11.7 in 12/2015.  Marland Kitchen HLD (hyperlipidemia)   . NSTEMI (non-ST elevated myocardial infarction) [facility])     Social History   Social History  . Marital status: Married    Spouse name: N/A  . Number of children: N/A  . Years of education: N/A   Occupational History  . Not on file.   Social History Main Topics  . Smoking status: Never Smoker  . Smokeless tobacco: Never Used  . Alcohol use Yes     Comment: occasional  . Drug use: No  . Sexual activity: Not on file   Other Topics Concern  . Not on file   Social History Narrative  . No narrative on file     Family History  Problem Relation Age of Onset  . Hypertension Mother   . Heart attack Neg Hx      Review of Systems: General: negative for chills, fever, night sweats or weight changes.  Cardiovascular: negative for chest pain, dyspnea on exertion, edema, orthopnea, palpitations, paroxysmal nocturnal dyspnea or shortness of breath Dermatological: negative for rash Respiratory: negative for cough or wheezing Urologic: negative for hematuria Abdominal: negative for nausea, vomiting, diarrhea, bright red blood per rectum, melena, or hematemesis Neurologic: negative for visual changes, syncope, or dizziness All other systems reviewed and are otherwise negative except as noted above.    Blood pressure 116/60, pulse 64, resp.  rate 16, height 5' 11"  (1.803 m), weight 168 lb (76.2 kg), SpO2 98 %.  General appearance: alert, cooperative and no distress Neck: no carotid bruit and no JVD Lungs: clear to auscultation bilaterally Heart: regular rate and rhythm Extremities: extremities normal, atraumatic, no cyanosis or edema Skin: Skin color, texture, turgor normal. 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 I will arrange for a fasting CMET and Lipid panel. F/U with Dr Sallyanne Kuster in 6 months.  Kerin Ransom PA-C 02/05/2017 11:03 AM

## 2017-03-12 ENCOUNTER — Encounter (INDEPENDENT_AMBULATORY_CARE_PROVIDER_SITE_OTHER): Payer: Self-pay

## 2017-03-12 ENCOUNTER — Ambulatory Visit: Payer: Self-pay | Admitting: Internal Medicine

## 2017-03-12 ENCOUNTER — Other Ambulatory Visit: Payer: Self-pay

## 2017-03-12 DIAGNOSIS — E119 Type 2 diabetes mellitus without complications: Secondary | ICD-10-CM

## 2017-03-12 DIAGNOSIS — K219 Gastro-esophageal reflux disease without esophagitis: Secondary | ICD-10-CM

## 2017-03-12 DIAGNOSIS — Z951 Presence of aortocoronary bypass graft: Secondary | ICD-10-CM

## 2017-03-12 DIAGNOSIS — I252 Old myocardial infarction: Secondary | ICD-10-CM

## 2017-03-12 HISTORY — DX: Gastro-esophageal reflux disease without esophagitis: K21.9

## 2017-03-12 MED ORDER — FAMOTIDINE 20 MG PO TABS: 20.0000 mg | ORAL_TABLET | Freq: Two times a day (BID) | ORAL | 11 refills | Status: DC

## 2017-03-12 NOTE — Patient Instructions (Signed)
It was great meeting you today!  Today we talked about your reflux. I sent in a prescription which is on the $4 list at [facility]. It's called Famotidine and you will take 1 pill twice daily! I've sent in a lot of refills.   If this medication does not help, you can buy over the counter Omeprazole 20mg  and take this once or twice daily.  For fast relief, you can try Maalox!

## 2017-03-12 NOTE — Assessment & Plan Note (Signed)
Previously controlled with PPI however pt out of refills and instructed to follow-up with PCP for further treatment. He requests medications that are only on the [facility] at [facility] due to financial and time constraints. Unfortunately they have no $4 or less PPIs. -Start Famotidine 20mg  BID -Can use Maalox today for quick relief -Patient told to switch to OTC Omeprazole if Famotidine not helpful

## 2017-03-12 NOTE — Progress Notes (Signed)
   CC: Reflux, would like med refill  HPI:  Kurt Mathis is a 56 y.o. M with well-controlled DM2, hx of NSTEMI now s/p CABG and history of GERD here for reflux medicine refill. States he's been having reflux symptoms for over a year which have been well controlled with a medication his heart doctor gave him. He ran out of refills and was told to discuss this with his primary care doctor. He has been out of Protonix for a few weeks and has recurrent reflux. He requests medications that are $4 or less due to financial constraints.   Past Medical History:  Diagnosis Date  . Cardiomyopathy ([place])   . Coronary artery disease    a. 12/2015: NSTEMI w/ cath showing severe multivessel disease. CABG recommended. b. CABG on 01/01/2016 w/ LIMA-LAD, SVG-RCA-PDA, SVG-D1, and Left Radial-OM1.  . Diabetes mellitus without complication ([place])    a. A1c elevated to 11.7 in 12/2015.  Marland Kitchen HLD (hyperlipidemia)   . NSTEMI (non-ST elevated myocardial infarction) ([place])    Review of Systems:   General: Denies fevers, chills HEENT: Denies sore throat, dysphagia Cardiac: Denies CP, SOB Pulmonary: Denies cough, wheezes, PND Abd: +heartburn. Denies  changes in bowels. No vomiting. Extremities: Denies weakness or swelling  Physical Exam: General: Alert, in no acute distress. Pleasant and conversant. Wife and son present. HEENT: No icterus, injection or ptosis. No hoarseness or dysarthria  Cardiac: RRR, no MGR appreciated Pulmonary: CTA BL with normal WOB on RA. Able to speak in complete sentences Abd: Soft, non-tender. +bs Extremities: Warm, perfused. No significant pedal edema. Several well healed linear scars from vein-harvest for CABG  Vitals:   03/12/17 1428 03/12/17 1508  BP: (!) 153/63 136/76  Pulse: 66   Temp: (!) 97.4 F (36.3 C)   TempSrc: Oral   SpO2: 99%   Weight: 169 lb 8 oz (76.9 kg)   Height: 5\' 11"  (1.803 m)    Assessment & Plan:   See Encounters Tab for problem based  charting.  Patient discussed with Dr. Dareen Piano

## 2017-03-12 NOTE — Progress Notes (Signed)
Internal Medicine Clinic Attending  Case discussed with Dr. Molt at the time of the visit.  We reviewed the resident's history and exam and pertinent patient test results.  I agree with the assessment, diagnosis, and plan of care documented in the resident's note. 

## 2017-06-24 ENCOUNTER — Other Ambulatory Visit: Payer: Self-pay | Admitting: *Deleted

## 2017-06-24 DIAGNOSIS — I214 Non-ST elevation (NSTEMI) myocardial infarction: Secondary | ICD-10-CM

## 2017-06-24 MED ORDER — ATORVASTATIN CALCIUM 40 MG PO TABS: 40.0000 mg | ORAL_TABLET | Freq: Every day | ORAL | 2 refills | Status: DC

## 2017-10-07 ENCOUNTER — Other Ambulatory Visit: Payer: Self-pay | Admitting: Internal Medicine

## 2017-10-07 DIAGNOSIS — I214 Non-ST elevation (NSTEMI) myocardial infarction: Secondary | ICD-10-CM

## 2017-10-24 ENCOUNTER — Encounter: Payer: Self-pay | Admitting: Internal Medicine

## 2017-11-01 ENCOUNTER — Other Ambulatory Visit: Payer: Self-pay | Admitting: Internal Medicine

## 2017-11-01 DIAGNOSIS — I214 Non-ST elevation (NSTEMI) myocardial infarction: Secondary | ICD-10-CM

## 2017-11-25 ENCOUNTER — Ambulatory Visit: Payer: Self-pay

## 2017-12-02 ENCOUNTER — Encounter: Payer: Self-pay | Admitting: Internal Medicine

## 2017-12-02 ENCOUNTER — Other Ambulatory Visit: Payer: Self-pay

## 2017-12-02 ENCOUNTER — Ambulatory Visit: Payer: Self-pay | Admitting: Internal Medicine

## 2017-12-02 VITALS — BP 136/75 | HR 58 | Temp 98.0°F | Ht 71.0 in | Wt 163.1 lb

## 2017-12-02 DIAGNOSIS — E119 Type 2 diabetes mellitus without complications: Secondary | ICD-10-CM

## 2017-12-02 DIAGNOSIS — I252 Old myocardial infarction: Secondary | ICD-10-CM

## 2017-12-02 DIAGNOSIS — Z955 Presence of coronary angioplasty implant and graft: Secondary | ICD-10-CM

## 2017-12-02 DIAGNOSIS — Z7984 Long term (current) use of oral hypoglycemic drugs: Secondary | ICD-10-CM

## 2017-12-02 DIAGNOSIS — K219 Gastro-esophageal reflux disease without esophagitis: Secondary | ICD-10-CM

## 2017-12-02 DIAGNOSIS — Z79899 Other long term (current) drug therapy: Secondary | ICD-10-CM

## 2017-12-02 DIAGNOSIS — I214 Non-ST elevation (NSTEMI) myocardial infarction: Secondary | ICD-10-CM

## 2017-12-02 DIAGNOSIS — Z7982 Long term (current) use of aspirin: Secondary | ICD-10-CM

## 2017-12-02 DIAGNOSIS — I2510 Atherosclerotic heart disease of native coronary artery without angina pectoris: Secondary | ICD-10-CM

## 2017-12-02 DIAGNOSIS — Z951 Presence of aortocoronary bypass graft: Secondary | ICD-10-CM

## 2017-12-02 LAB — POCT GLYCOSYLATED HEMOGLOBIN (HGB A1C): Hemoglobin A1C: 7.4 % — AB (ref 4.0–5.6)

## 2017-12-02 LAB — GLUCOSE, CAPILLARY: Glucose-Capillary: 146 mg/dL — ABNORMAL HIGH (ref 70–99)

## 2017-12-02 MED ORDER — ATORVASTATIN CALCIUM 40 MG PO TABS: ORAL_TABLET | ORAL | 3 refills | Status: DC

## 2017-12-02 MED ORDER — METOPROLOL TARTRATE 25 MG PO TABS: 12.5000 mg | ORAL_TABLET | Freq: Two times a day (BID) | ORAL | 5 refills | Status: DC

## 2017-12-02 MED ORDER — METFORMIN HCL 500 MG PO TABS: 500.0000 mg | ORAL_TABLET | Freq: Two times a day (BID) | ORAL | 3 refills | Status: DC

## 2017-12-02 MED ORDER — OMEPRAZOLE 40 MG PO CPDR
40.0000 mg | DELAYED_RELEASE_CAPSULE | Freq: Every day | ORAL | 3 refills | Status: DC
Start: 2017-12-02 — End: 2019-01-19

## 2017-12-02 MED ORDER — ASPIRIN EC 81 MG PO TBEC: 81.0000 mg | DELAYED_RELEASE_TABLET | Freq: Every day | ORAL | 3 refills | Status: DC

## 2017-12-02 MED FILL — OMEPRAZOLE 40 MG CPDR: 40 | 30 days supply | Qty: 30 | Fill #0

## 2017-12-02 NOTE — Patient Instructions (Signed)
[name],   Continue taking your medications as usual.   STOP taking famotidine and START taking omeprazole 1 tablet (40 mg) daily before breakfast.    Please make a follow-up appointment with me in 3 months for regular follow-up.  In the meantime you can call us if you have any questions or concerns.  - Dr. Frederico Hamman

## 2017-12-03 ENCOUNTER — Encounter: Payer: Self-pay | Admitting: Internal Medicine

## 2017-12-03 NOTE — Assessment & Plan Note (Signed)
T2DM: Patient presents for DM follow up.  He is well controlled on metformin but ran out of medication 2-3 weeks ago. A1c 6.7-> 7.4. Denies polydipsia, increased thirst and urinary symptoms. Checks BG 1x/day and states BG usually 100-115.   - Refilled metformin 1000 mg BID. 3 month supply sent. Advised patient call clinic when running out of medication.  - Counseled on low carbohydrate diet  - Follow up in 3 months

## 2017-12-03 NOTE — Assessment & Plan Note (Signed)
GERD: Patient has long history of GERD that was been well-controlled on famotidine 20 mg BID.  He presents today complaining of worsening heartburn.  Denies radiation of pain, shortness of breath, N/V, dysphagia, odynophagia, decreased appetite, and weight loss.  Denies tobacco use.  He has been compliant with famotidine and takes it as prescribed as well as before meals.  Ideally, would recommend EGD for further evaluation however patient uninsured and unable to afford this. Will stop famotidine and switch to a PPI. Patient will got to [facility] as there are no PPIs on $4 list at [facility].  - STOP famotidine  - Omeprazole 40 mg QD before breakfast, 3 month supply prescribed as patient has transportation issues and unable to pick up medication on a monthly basis

## 2017-12-03 NOTE — Progress Notes (Signed)
   CC: T2DM and GERD follow up  HPI:  [name] is a 57 y.o. male with PMh listed below who presents to clinic for follow-up of diabetes and GERD.  T2DM: Patient presents for DM follow up.  He is well controlled on metformin but ran out of medication 2-3 weeks ago. A1c 6.7-> 7.4. Denies polydipsia, increased thirst and urinary symptoms. Checks BG 1x/day and states BG usually 100-115.   - Refilled metformin 1000 mg BID. 3 month supply sent. Advised patient call clinic when running out of medication.  - Counseled on low carbohydrate diet  - Follow up in 3 months   GERD: Patient has long history of GERD that was been well-controlled on famotidine 20 mg BID.  He presents today complaining of worsening heartburn.  Denies radiation of pain, shortness of breath, N/V, dysphagia, odynophagia, decreased appetite, and weight loss.  Denies tobacco use.  He has been compliant with famotidine and takes it as prescribed as well as before meals.  Ideally, would recommend EGD for further evaluation however patient uninsured and unable to afford this. Will stop famotidine and switch to a PPI. Patient will got to [facility] as there are no PPIs on $4 list at [facility].  - STOP famotidine  - Omeprazole 40 mg QD before breakfast, 3 month supply prescribed as patient has transportation issues and unable to pick up medication on a monthly basis  NSTEMI: Patient has a history to NSTEMI and ICM s/p CABG x5 in 2017. Last TTE 04/2017 with EF 50-55% and hypokinesis of the basal inferior myocardium.  No valvular disease, but RV function mildly reduced.  He reports doing well since surgery and denies chest pain, palpitations, orthopnea, PND, and LE swelling. On metoprolol 12.5 mg BID, but the pressure now 2-3 weeks ago. Not currently on ACEi as patient unable to afford any more medications.  - Refilled metoprolol 12.5 mg BID - Refilled ASA 81 mg QD  - Refilled atorvastatin 40 mg QD    Past Medical History:  Diagnosis  Date  . Cardiomyopathy ([place])   . Coronary artery disease    a. 12/2015: NSTEMI w/ cath showing severe multivessel disease. CABG recommended. b. CABG on 01/01/2016 w/ LIMA-LAD, SVG-RCA-PDA, SVG-D1, and Left Radial-OM1.  . Diabetes mellitus without complication ([place])    a. A1c elevated to 11.7 in 12/2015.  Marland Kitchen HLD (hyperlipidemia)   . NSTEMI (non-ST elevated myocardial infarction) ([place])    Review of Systems:   Review of Systems  Constitutional: Negative for chills and fever.  Respiratory: Negative for cough and shortness of breath.   Cardiovascular: Negative for chest pain, palpitations and leg swelling.  Gastrointestinal: Positive for heartburn. Negative for abdominal pain, nausea and vomiting.  Genitourinary: Negative for frequency and urgency.  Neurological: Negative for dizziness and headaches.   Physical Exam: Vitals:   12/02/17 0843  BP: 136/75  Pulse: (!) 58  Temp: 98 F (36.7 C)  TempSrc: Oral  SpO2: 100%  Weight: 163 lb 1.6 oz (74 kg)  Height: 5\' 11"  (1.803 m)   General: Well-appearing male, in no acute distress CV:RRR, no mrg Pulm:CTAB, no wheezes or crackles, no acute breathing room air Abd: Soft, nontender nondistended, normoactive bowel sounds Ext: Warm and well-perfused without cyanosis or edema  Assessment & Plan:   See Encounters Tab for problem based charting.  Patient discussed with Dr. Rebeca Alert

## 2017-12-03 NOTE — Assessment & Plan Note (Signed)
NSTEMI: Patient has a history to NSTEMI and ICM s/p CABG x5 in 2017. Last TTE 04/2017 with EF 50-55% and hypokinesis of the basal inferior myocardium.  No valvular disease, but RV function mildly reduced.  He reports doing well since surgery and denies chest pain, palpitations, orthopnea, PND, and LE swelling. On metoprolol 12.5 mg BID, but the pressure now 2-3 weeks ago. Not currently on ACEi as patient unable to afford any more medications.  - Refilled metoprolol 12.5 mg BID - Refilled ASA 81 mg QD  - Refilled atorvastatin 40 mg QD

## 2017-12-04 NOTE — Progress Notes (Signed)
Internal Medicine Clinic Attending  Case discussed with Dr. Santos-Sanchez at the time of the visit.  We reviewed the resident's history and exam and pertinent patient test results.  I agree with the assessment, diagnosis, and plan of care documented in the resident's note.  Alexander Raines, M.D., Ph.D.  

## 2018-03-02 IMAGING — CR DG CHEST 1V PORT
1 series · 1 of 1 positions shown · non-contrast
Comparison: 12/23/2015

CLINICAL DATA: Status post CABG

EXAM:
PORTABLE CHEST 1 VIEW

[AP]
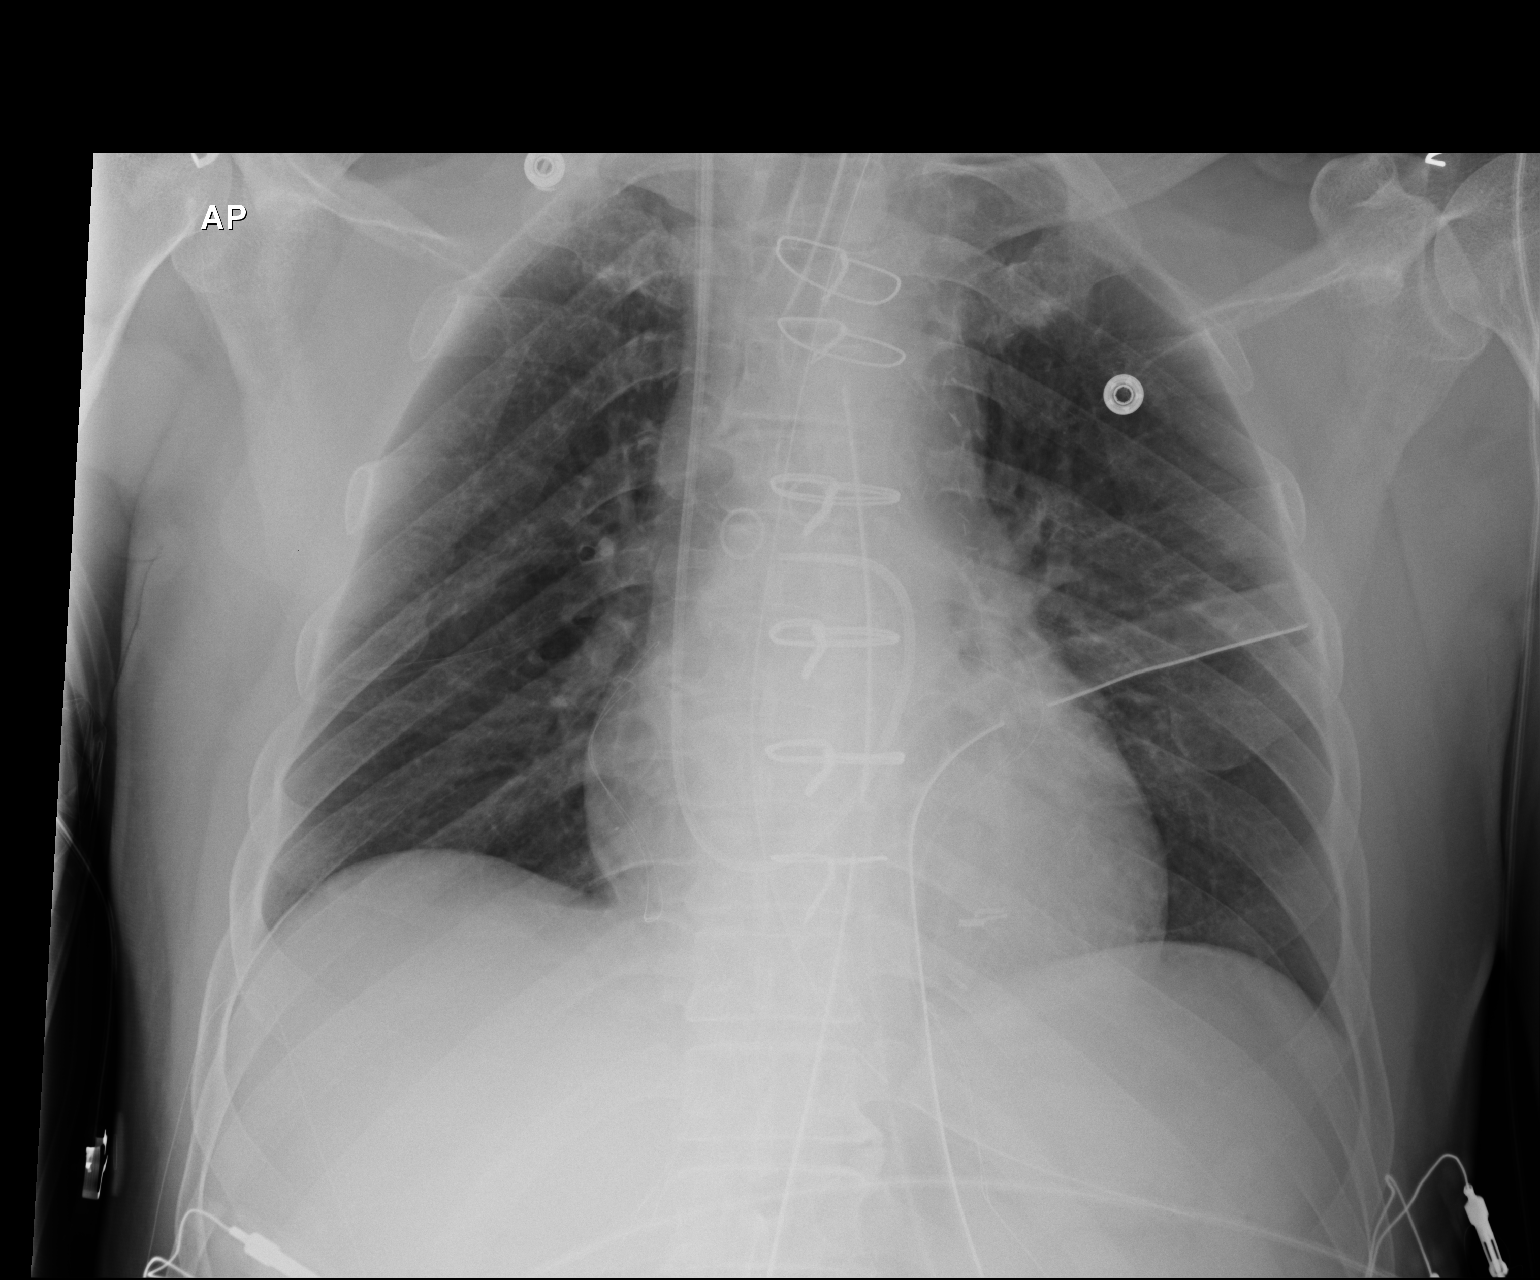

[1 of 1 positions shown; findings below may reference images not displayed]

FINDINGS: There is ET tube with tip above the carina. Mediastinal drain is in
place. There is a left-sided chest tube. Swan-Ganz catheter tip is
in the right pulmonary artery is a gastric tube is identified with
side port below the GE junction. Normal heart size. No pleural
effusion or edema. No pneumothorax identified.
IMPRESSION: 1. Support apparatus positioned as above.
2. Left chest tube in place without evidence for pneumothorax.
3. No CHF.

## 2018-03-05 IMAGING — CR DG CHEST 2V
2 series · 2 of 2 positions shown · non-contrast
Comparison: 01/03/2016.

CLINICAL DATA: Atelectasis.

EXAM:
CHEST  2 VIEW

[chest pa]
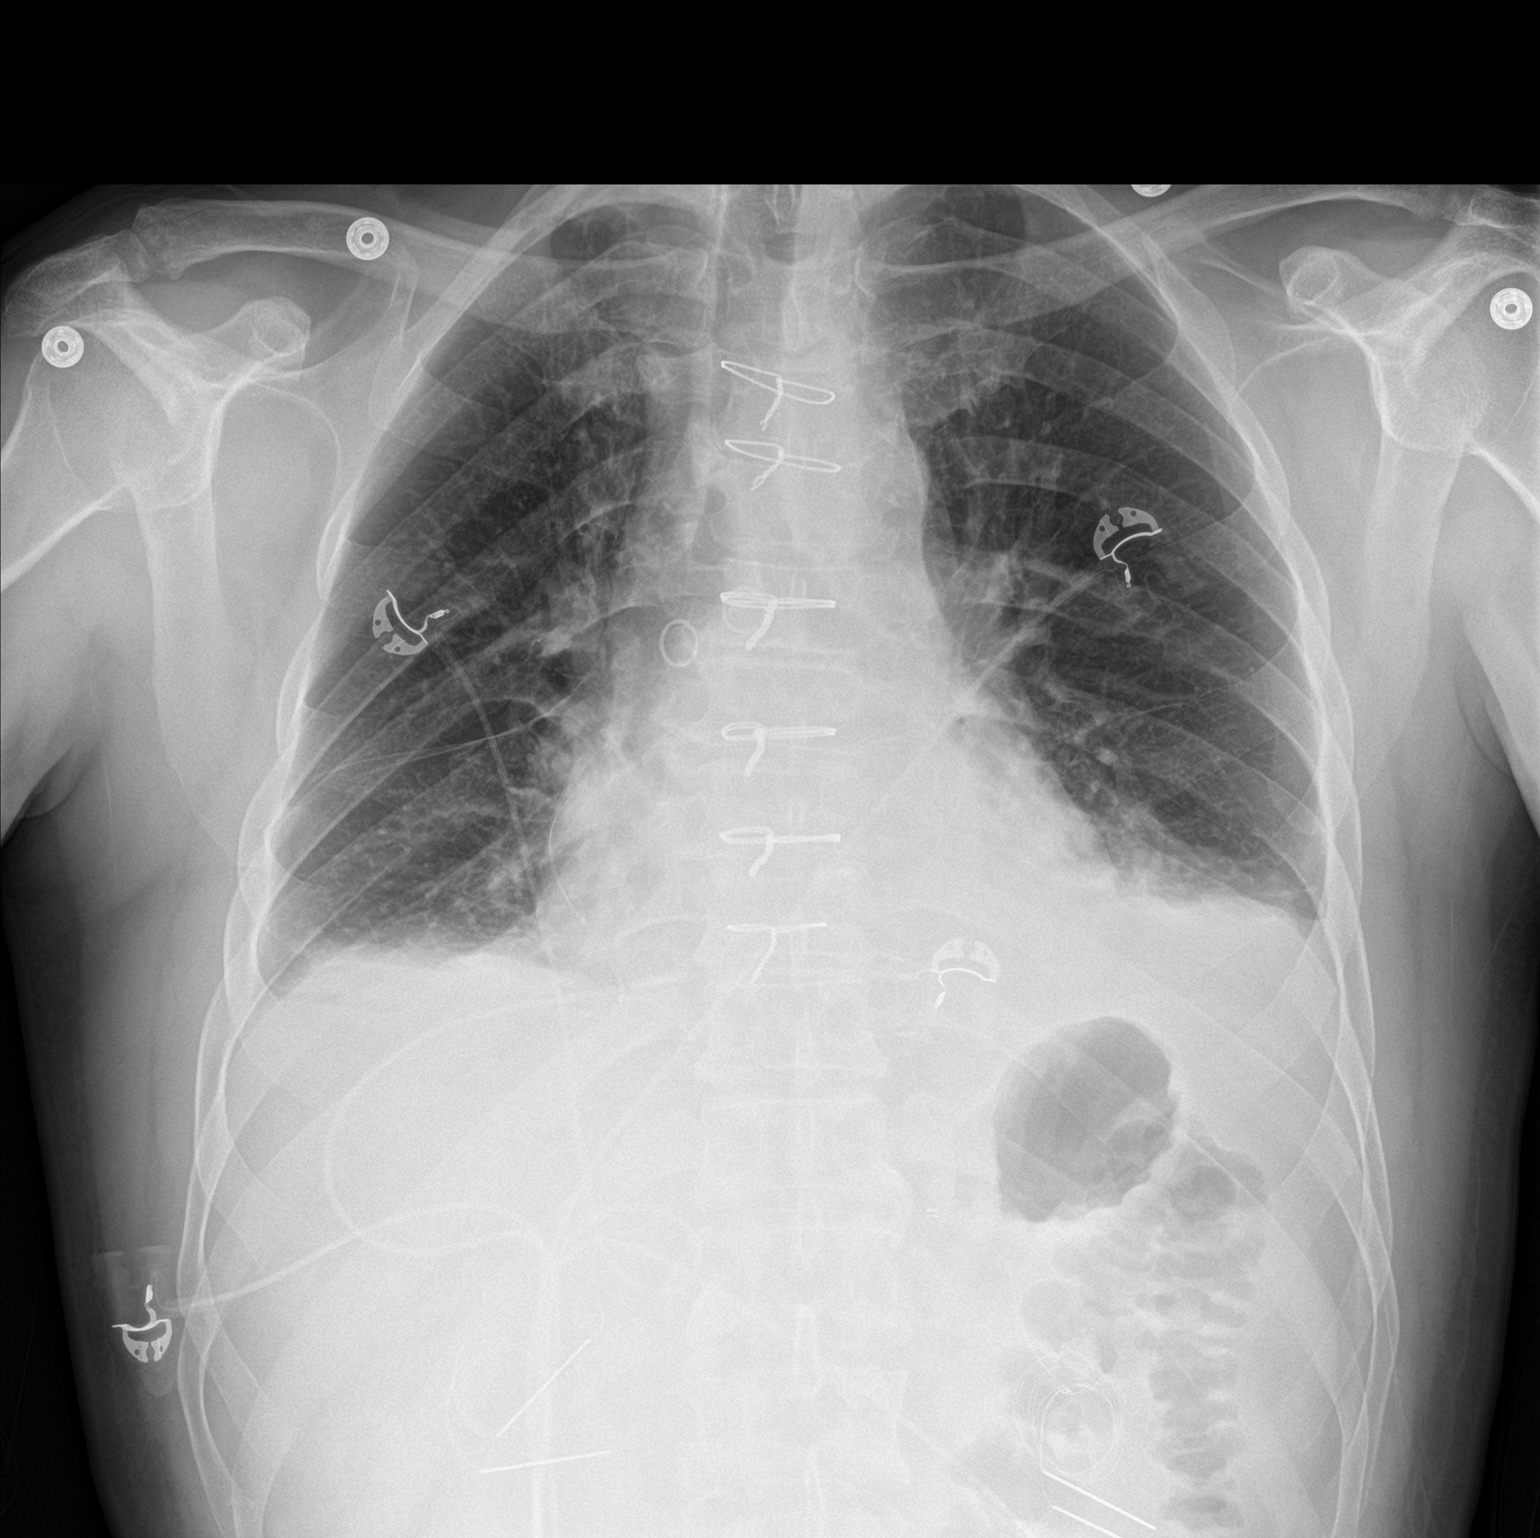

[chest lat]
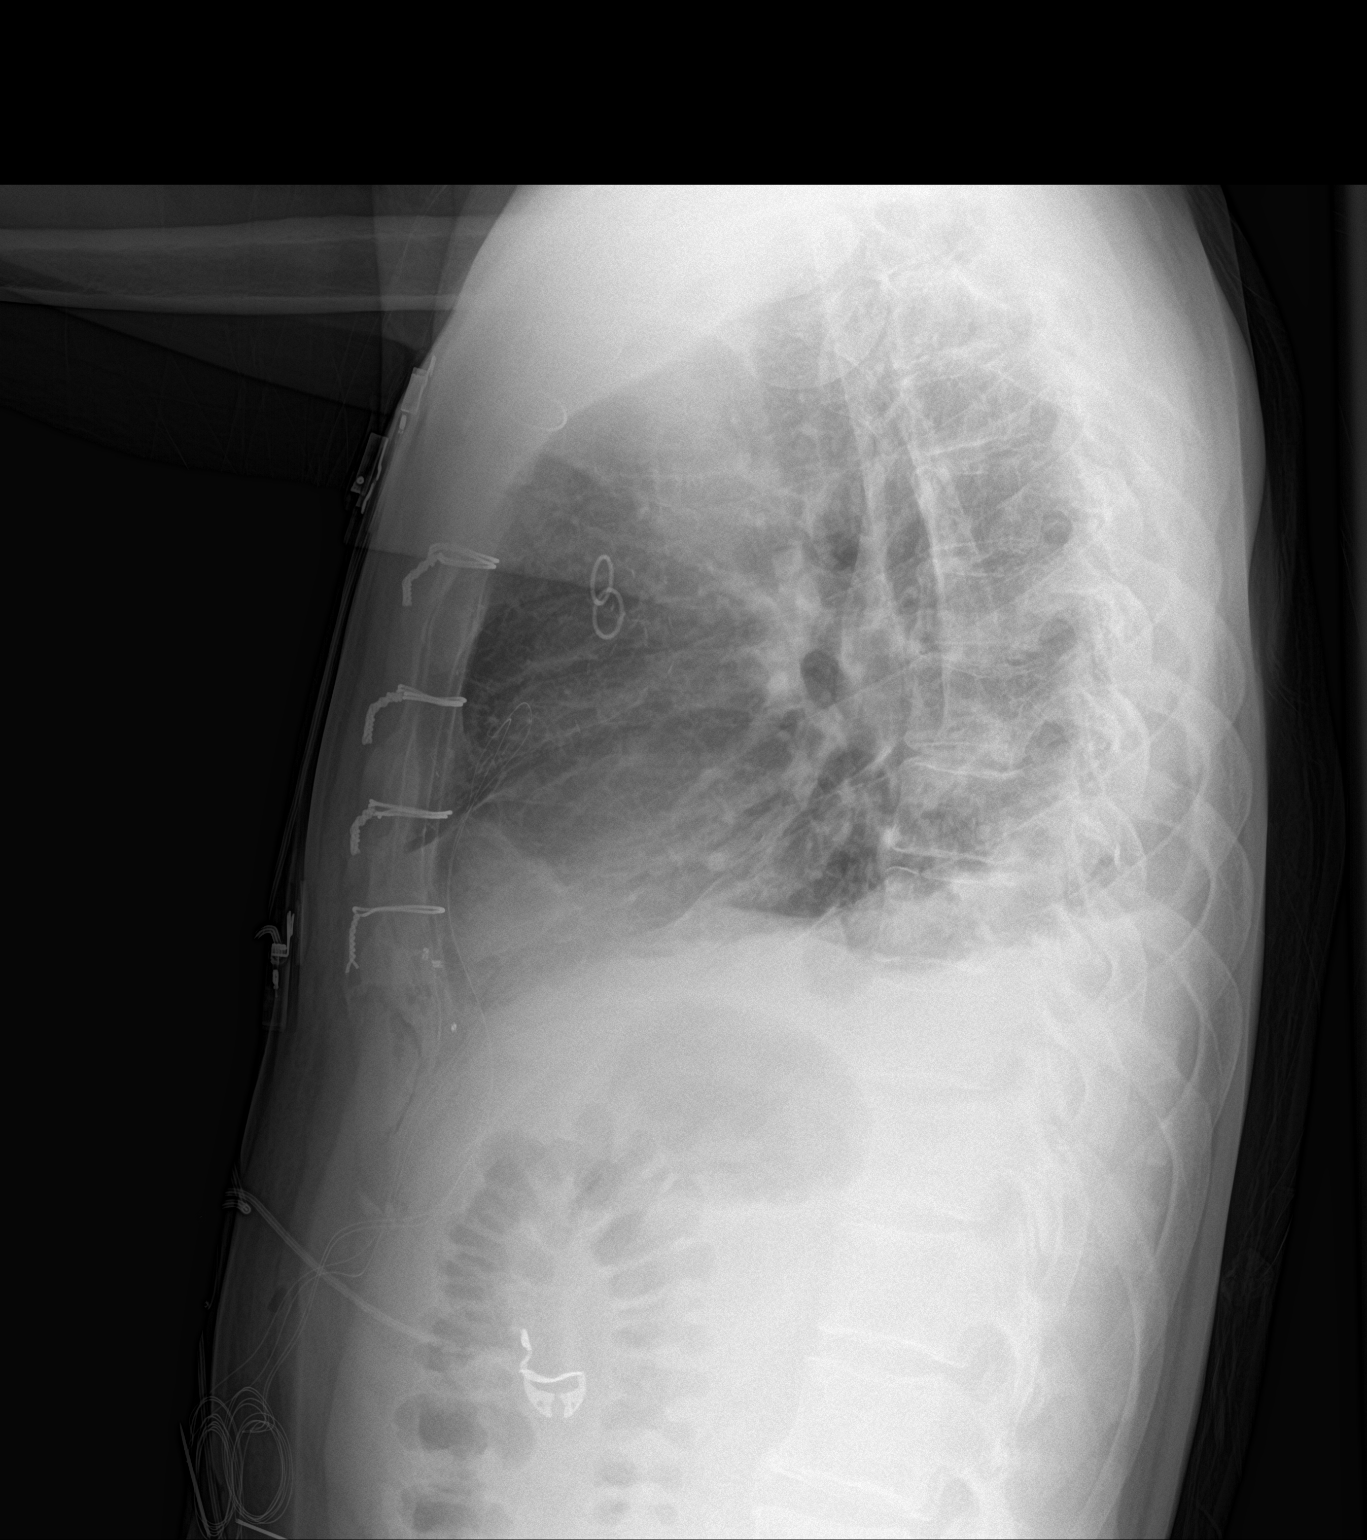

[2 of 2 positions shown; findings below may reference images not displayed]

FINDINGS: Prior CABG. Cardiomegaly with normal pulmonary vascularity. Low lung
volumes with bibasilar atelectasis and/or infiltrates/edema. Small
bilateral pleural effusions.
IMPRESSION: 1.  Prior CABG.

2. Low lung volumes with bibasilar atelectasis and/or
infiltrates/edema. Small bilateral pleural effusions.

## 2018-09-14 ENCOUNTER — Telehealth: Payer: Self-pay

## 2018-09-14 NOTE — Telephone Encounter (Signed)
Called patient to schedule labs (A1c, BMP,  Microalbumin). Patient stated he would call back sometime this week to schedule.

## 2018-09-14 NOTE — Telephone Encounter (Signed)
Thank you for letting me know

## 2018-10-02 ENCOUNTER — Telehealth: Payer: Self-pay

## 2018-10-02 DIAGNOSIS — E119 Type 2 diabetes mellitus without complications: Secondary | ICD-10-CM

## 2018-10-02 NOTE — Telephone Encounter (Signed)
Called patient to schedule labs (bmp, a1c, and microalbumin:scr). Patient requested an appointment on July 2 at 3:00pm.

## 2018-10-08 ENCOUNTER — Other Ambulatory Visit: Payer: Self-pay

## 2018-10-08 ENCOUNTER — Other Ambulatory Visit: Payer: Self-pay | Admitting: Pharmacist

## 2018-10-08 ENCOUNTER — Other Ambulatory Visit (INDEPENDENT_AMBULATORY_CARE_PROVIDER_SITE_OTHER): Payer: Self-pay

## 2018-10-08 DIAGNOSIS — E119 Type 2 diabetes mellitus without complications: Secondary | ICD-10-CM

## 2018-10-08 DIAGNOSIS — I214 Non-ST elevation (NSTEMI) myocardial infarction: Secondary | ICD-10-CM

## 2018-10-08 LAB — POCT GLYCOSYLATED HEMOGLOBIN (HGB A1C): Hemoglobin A1C: 8.4 % — AB (ref 4.0–5.6)

## 2018-10-08 LAB — GLUCOSE, CAPILLARY: Glucose-Capillary: 230 mg/dL — ABNORMAL HIGH (ref 70–99)

## 2018-10-08 MED ORDER — METOPROLOL TARTRATE 25 MG PO TABS: 12.5000 mg | ORAL_TABLET | Freq: Two times a day (BID) | ORAL | 5 refills | Status: DC

## 2018-10-08 MED ORDER — METFORMIN HCL 500 MG PO TABS: 500.0000 mg | ORAL_TABLET | Freq: Two times a day (BID) | ORAL | 3 refills | Status: DC

## 2018-10-08 MED ORDER — ATORVASTATIN CALCIUM 40 MG PO TABS: ORAL_TABLET | ORAL | 3 refills | Status: DC

## 2018-10-09 LAB — BMP8+ANION GAP
Anion Gap: 18 mmol/L (ref 10.0–18.0)
BUN/Creatinine Ratio: 16 (ref 9–20)
BUN: 13 mg/dL (ref 6–24)
CO2: 23 mmol/L (ref 20–29)
Calcium: 9.1 mg/dL (ref 8.7–10.2)
Chloride: 96 mmol/L (ref 96–106)
Creatinine, Ser: 0.79 mg/dL (ref 0.76–1.27)
GFR calc Af Amer: 115 mL/min/{1.73_m2} (ref >59)
GFR calc non Af Amer: 100 mL/min/{1.73_m2} (ref >59)
Glucose: 249 mg/dL — ABNORMAL HIGH (ref 65–99)
Potassium: 4.3 mmol/L (ref 3.5–5.2)
Sodium: 137 mmol/L (ref 134–144)

## 2018-10-09 LAB — MICROALBUMIN / CREATININE URINE RATIO
Creatinine, Urine: 127.2 mg/dL
Microalb/Creat Ratio: 4 mg/g creat (ref 0–29)
Microalbumin, Urine: 5.5 ug/mL

## 2018-10-13 ENCOUNTER — Telehealth: Payer: Self-pay

## 2018-10-13 NOTE — Telephone Encounter (Signed)
Called patient with an interpreter on the line to ask if he would be interested in starting [place]. Patient said he is just now getting back to work after being off for the last 3 months and that money is tight, so the medication can not be expensive or else he won't take it. I told him we would price check the medication and call him with the price. He said [facility] would be the most convenient for his prescription, but the phone cut out when he said which [place].  Told the patient he would have to return in 2 weeks to get updated labs to make sure his kidneys are functioning ok and he said he understood.  Tried to call back to double check what dose of metformin he was taking and which [facility] to send the prescription to, but he did not answer.

## 2018-10-13 NOTE — Telephone Encounter (Signed)
No problem, [place]! I spoke to patient and notified him we can start him with samples from the clinic for now, and then apply for patient assistance program. He is going to try to come to clinic next week but needs to call back and let us know which day due to driving to [place].

## 2018-10-22 ENCOUNTER — Ambulatory Visit: Payer: Self-pay | Admitting: Pharmacist

## 2018-11-02 ENCOUNTER — Ambulatory Visit: Payer: Self-pay | Admitting: Pharmacist

## 2018-11-02 ENCOUNTER — Encounter: Payer: Self-pay | Admitting: Internal Medicine

## 2018-12-07 ENCOUNTER — Other Ambulatory Visit: Payer: Self-pay

## 2018-12-07 DIAGNOSIS — K219 Gastro-esophageal reflux disease without esophagitis: Secondary | ICD-10-CM

## 2018-12-07 DIAGNOSIS — I214 Non-ST elevation (NSTEMI) myocardial infarction: Secondary | ICD-10-CM

## 2018-12-07 DIAGNOSIS — E119 Type 2 diabetes mellitus without complications: Secondary | ICD-10-CM

## 2018-12-07 NOTE — Telephone Encounter (Signed)
metFORMIN (GLUCOPHAGE) 500 MG tablet metoprolol tartrate (LOPRESSOR) 25 MG tablet   atorvastatin (LIPITOR) 40 MG tablet   @ [facility] in [place]. Please call pt back.

## 2018-12-09 MED ORDER — METOPROLOL TARTRATE 25 MG PO TABS: 12.5000 mg | ORAL_TABLET | Freq: Two times a day (BID) | ORAL | 0 refills | Status: DC

## 2018-12-09 MED ORDER — METFORMIN HCL 500 MG PO TABS: 500.0000 mg | ORAL_TABLET | Freq: Two times a day (BID) | ORAL | 1 refills | Status: DC

## 2018-12-09 MED ORDER — ATORVASTATIN CALCIUM 40 MG PO TABS: ORAL_TABLET | ORAL | 3 refills | Status: DC

## 2018-12-09 NOTE — Telephone Encounter (Signed)
I will refill the medicines to cover for the next 8 weeks to give him time to make an appt with [place]. I think telehealth may be difficult if he does not speak English, I would prefer he comes to clinic since he needs an A1c (due next month), foot exam, and an eye exam. I am happy to see him in a month if I have availability, if not [facility] is ok.

## 2018-12-09 NOTE — Telephone Encounter (Signed)
Requesting to speak with a nurse about meds. Please call back.  

## 2018-12-09 NOTE — Telephone Encounter (Signed)
Pt currently working/living out of town [facility]) and has requested refills to be sent to [facility] [place]. Will send to pcp for review. Pt overdue for a visit. Will send request to pcp.    Is it appropriate for pt to have atleast a telehealth visit and then return for labs if appropriate, or refill meds until he can get him in on next avail appt with pcp  ([facility] if pcp not available)?  Please advise.Despina Hidden Cassady9/2/20204:57 PM

## 2019-01-18 ENCOUNTER — Other Ambulatory Visit: Payer: Self-pay

## 2019-01-18 ENCOUNTER — Encounter: Payer: Self-pay | Admitting: Internal Medicine

## 2019-01-18 ENCOUNTER — Ambulatory Visit: Payer: Self-pay | Admitting: Internal Medicine

## 2019-01-18 VITALS — BP 138/63 | HR 68 | Temp 98.0°F | Ht 70.0 in | Wt 164.1 lb

## 2019-01-18 DIAGNOSIS — Z951 Presence of aortocoronary bypass graft: Secondary | ICD-10-CM

## 2019-01-18 DIAGNOSIS — I214 Non-ST elevation (NSTEMI) myocardial infarction: Secondary | ICD-10-CM

## 2019-01-18 DIAGNOSIS — E119 Type 2 diabetes mellitus without complications: Secondary | ICD-10-CM

## 2019-01-18 DIAGNOSIS — Z7984 Long term (current) use of oral hypoglycemic drugs: Secondary | ICD-10-CM

## 2019-01-18 DIAGNOSIS — Z23 Encounter for immunization: Secondary | ICD-10-CM

## 2019-01-18 DIAGNOSIS — Z7982 Long term (current) use of aspirin: Secondary | ICD-10-CM

## 2019-01-18 DIAGNOSIS — Z79899 Other long term (current) drug therapy: Secondary | ICD-10-CM

## 2019-01-18 DIAGNOSIS — I2510 Atherosclerotic heart disease of native coronary artery without angina pectoris: Secondary | ICD-10-CM

## 2019-01-18 DIAGNOSIS — I10 Essential (primary) hypertension: Secondary | ICD-10-CM

## 2019-01-18 DIAGNOSIS — I252 Old myocardial infarction: Secondary | ICD-10-CM

## 2019-01-18 LAB — GLUCOSE, CAPILLARY: Glucose-Capillary: 188 mg/dL — ABNORMAL HIGH (ref 70–99)

## 2019-01-18 LAB — POCT GLYCOSYLATED HEMOGLOBIN (HGB A1C): Hemoglobin A1C: 7.7 % — AB (ref 4.0–5.6)

## 2019-01-18 MED ORDER — METOPROLOL TARTRATE 25 MG PO TABS: 12.5000 mg | ORAL_TABLET | Freq: Two times a day (BID) | ORAL | 2 refills | Status: DC

## 2019-01-18 MED ORDER — METFORMIN HCL 500 MG PO TABS: 500.0000 mg | ORAL_TABLET | Freq: Two times a day (BID) | ORAL | 3 refills | Status: DC

## 2019-01-18 MED ORDER — ASPIRIN EC 81 MG PO TBEC: 81.0000 mg | DELAYED_RELEASE_TABLET | Freq: Every day | ORAL | 3 refills | Status: DC

## 2019-01-18 MED ORDER — ATORVASTATIN CALCIUM 40 MG PO TABS: ORAL_TABLET | ORAL | 3 refills | Status: DC

## 2019-01-18 NOTE — Patient Instructions (Signed)
Kurt Mathis,   Continue tomando los medicamentos que toma como siempre (metoprolol, atorvastatin, aspirin, and metformin). Le envie refills a su [employer] en [employer]. Haga una cita de seguimiento conmigo en 6 meses.   - Dr. Frederico Hamman

## 2019-01-18 NOTE — Progress Notes (Signed)
   CC: Diabetes follow up   HPI:  [name] is a 58 y.o. year-old male with PMH listed below who presents to clinic for diabetes follow up. Please see problem based assessment and plan for further details.   Past Medical History:  Diagnosis Date  . Cardiomyopathy ([place])   . Coronary artery disease    a. 12/2015: NSTEMI w/ cath showing severe multivessel disease. CABG recommended. b. CABG on 01/01/2016 w/ LIMA-LAD, SVG-RCA-PDA, SVG-D1, and Left Radial-OM1.  . Diabetes mellitus without complication ([place])    a. A1c elevated to 11.7 in 12/2015.  Marland Kitchen HLD (hyperlipidemia)   . NSTEMI (non-ST elevated myocardial infarction) ([place])    Review of Systems:   Review of Systems  Constitutional: Negative for chills, fever, malaise/fatigue and weight loss.  Respiratory: Negative for cough and shortness of breath.   Cardiovascular: Negative for chest pain, palpitations and leg swelling.  Gastrointestinal: Negative for abdominal pain, constipation, diarrhea, nausea and vomiting.  Genitourinary: Negative for frequency and urgency.    Physical Exam:  Vitals:   01/18/19 1449  Weight: 164 lb 1.6 oz (74.4 kg)  Height: 5\' 10"  (1.778 m)    General: well-appearing male in no acute distress  Cardiac: regular rate and rhythm, nl S1/S2, no murmurs, rubs or gallops  Pulm: CTAB, no wheezes or crackles, no increased work of breathing on room air  Ext: warm and well perfused, no peripheral edema     Assessment & Plan:   See Encounters Tab for problem based charting.  Patient discussed with Dr. Rebeca Alert

## 2019-01-19 ENCOUNTER — Encounter: Payer: Self-pay | Admitting: Internal Medicine

## 2019-01-19 NOTE — Assessment & Plan Note (Signed)
BP remains at goal with metoprolol. Will continue.

## 2019-01-19 NOTE — Assessment & Plan Note (Signed)
See A&P for CAD s/p CABG x5.

## 2019-01-19 NOTE — Assessment & Plan Note (Signed)
Patient presents for follow up of T2DM. He is on metformin 500 mg BID and reports compliance. Repeat A1c today is 7.7 from 8.4. We discussed increasing metformin to maximum dose, but patient stated his A1c decreased < 6 when this was done in the past and would therefore like to stay at current dose.  - Continue metformin 500 mg BID  - Foot exam performed today  - Needs annual eye exam, advised him to schedule an appt for [facility] card application  - Follow up in 3 months

## 2019-01-19 NOTE — Assessment & Plan Note (Signed)
Does not follow up with cardiology. Advised him to make an appt for [facility] application. Refilled aspirin, metoprolol, and atorvastatin.

## 2019-01-21 NOTE — Progress Notes (Signed)
Internal Medicine Clinic Attending  Case discussed with Dr. Santos-Sanchez at the time of the visit.  We reviewed the resident's history and exam and pertinent patient test results.  I agree with the assessment, diagnosis, and plan of care documented in the resident's note.  Alexander Raines, M.D., Ph.D.  

## 2019-07-21 ENCOUNTER — Other Ambulatory Visit: Payer: Self-pay | Admitting: Internal Medicine

## 2019-07-21 DIAGNOSIS — E119 Type 2 diabetes mellitus without complications: Secondary | ICD-10-CM

## 2019-08-03 ENCOUNTER — Encounter: Payer: Self-pay | Admitting: *Deleted

## 2019-08-28 ENCOUNTER — Other Ambulatory Visit: Payer: Self-pay | Admitting: Internal Medicine

## 2019-08-28 DIAGNOSIS — I214 Non-ST elevation (NSTEMI) myocardial infarction: Secondary | ICD-10-CM

## 2019-11-02 ENCOUNTER — Other Ambulatory Visit: Payer: Self-pay | Admitting: Internal Medicine

## 2019-11-02 DIAGNOSIS — E119 Type 2 diabetes mellitus without complications: Secondary | ICD-10-CM

## 2019-11-05 ENCOUNTER — Other Ambulatory Visit: Payer: Self-pay

## 2019-11-05 ENCOUNTER — Encounter (HOSPITAL_COMMUNITY): Payer: Self-pay

## 2019-11-05 DIAGNOSIS — I2510 Atherosclerotic heart disease of native coronary artery without angina pectoris: Secondary | ICD-10-CM | POA: Diagnosis present

## 2019-11-05 DIAGNOSIS — L03115 Cellulitis of right lower limb: Secondary | ICD-10-CM | POA: Diagnosis present

## 2019-11-05 DIAGNOSIS — A46 Erysipelas: Secondary | ICD-10-CM | POA: Diagnosis present

## 2019-11-05 DIAGNOSIS — X088XXA Exposure to other specified smoke, fire and flames, initial encounter: Secondary | ICD-10-CM | POA: Diagnosis present

## 2019-11-05 DIAGNOSIS — Z8249 Family history of ischemic heart disease and other diseases of the circulatory system: Secondary | ICD-10-CM

## 2019-11-05 DIAGNOSIS — I252 Old myocardial infarction: Secondary | ICD-10-CM

## 2019-11-05 DIAGNOSIS — K219 Gastro-esophageal reflux disease without esophagitis: Secondary | ICD-10-CM | POA: Diagnosis present

## 2019-11-05 DIAGNOSIS — I255 Ischemic cardiomyopathy: Secondary | ICD-10-CM | POA: Diagnosis present

## 2019-11-05 DIAGNOSIS — Z951 Presence of aortocoronary bypass graft: Secondary | ICD-10-CM

## 2019-11-05 DIAGNOSIS — T22011A Burn of unspecified degree of right forearm, initial encounter: Secondary | ICD-10-CM | POA: Diagnosis present

## 2019-11-05 DIAGNOSIS — Z20822 Contact with and (suspected) exposure to covid-19: Secondary | ICD-10-CM | POA: Diagnosis present

## 2019-11-05 DIAGNOSIS — Z7982 Long term (current) use of aspirin: Secondary | ICD-10-CM

## 2019-11-05 DIAGNOSIS — Z79890 Hormone replacement therapy (postmenopausal): Secondary | ICD-10-CM

## 2019-11-05 DIAGNOSIS — E11628 Type 2 diabetes mellitus with other skin complications: Principal | ICD-10-CM | POA: Diagnosis present

## 2019-11-05 DIAGNOSIS — E785 Hyperlipidemia, unspecified: Secondary | ICD-10-CM | POA: Diagnosis present

## 2019-11-05 DIAGNOSIS — Z79899 Other long term (current) drug therapy: Secondary | ICD-10-CM

## 2019-11-05 DIAGNOSIS — Z23 Encounter for immunization: Secondary | ICD-10-CM

## 2019-11-05 DIAGNOSIS — Z7984 Long term (current) use of oral hypoglycemic drugs: Secondary | ICD-10-CM

## 2019-11-05 DIAGNOSIS — E1140 Type 2 diabetes mellitus with diabetic neuropathy, unspecified: Secondary | ICD-10-CM | POA: Diagnosis present

## 2019-11-05 DIAGNOSIS — I10 Essential (primary) hypertension: Secondary | ICD-10-CM | POA: Diagnosis present

## 2019-11-05 LAB — CBC WITH DIFFERENTIAL/PLATELET
Abs Immature Granulocytes: 0.03 10*3/uL (ref 0.00–0.07)
Basophils Absolute: 0 10*3/uL (ref 0.0–0.1)
Basophils Relative: 0 %
Eosinophils Absolute: 0 10*3/uL (ref 0.0–0.5)
Eosinophils Relative: 0 %
HCT: 41 % (ref 39.0–52.0)
Hemoglobin: 13.6 g/dL (ref 13.0–17.0)
Immature Granulocytes: 0 %
Lymphocytes Relative: 18 %
Lymphs Abs: 1.6 10*3/uL (ref 0.7–4.0)
MCH: 30.8 pg (ref 26.0–34.0)
MCHC: 33.2 g/dL (ref 30.0–36.0)
MCV: 93 fL (ref 80.0–100.0)
Monocytes Absolute: 0.9 10*3/uL (ref 0.1–1.0)
Monocytes Relative: 10 %
Neutro Abs: 6.1 10*3/uL (ref 1.7–7.7)
Neutrophils Relative %: 72 %
Platelets: 219 10*3/uL (ref 150–400)
RBC: 4.41 MIL/uL (ref 4.22–5.81)
RDW: 13 % (ref 11.5–15.5)
WBC: 8.7 10*3/uL (ref 4.0–10.5)
nRBC: 0 % (ref 0.0–0.2)

## 2019-11-05 LAB — COMPREHENSIVE METABOLIC PANEL
ALT: 24 U/L (ref 0–44)
AST: 24 U/L (ref 15–41)
Albumin: 4.2 g/dL (ref 3.5–5.0)
Alkaline Phosphatase: 52 U/L (ref 38–126)
Anion gap: 14 (ref 5–15)
BUN: 17 mg/dL (ref 6–20)
CO2: 23 mmol/L (ref 22–32)
Calcium: 8.7 mg/dL — ABNORMAL LOW (ref 8.9–10.3)
Chloride: 97 mmol/L — ABNORMAL LOW (ref 98–111)
Creatinine, Ser: 0.9 mg/dL (ref 0.61–1.24)
GFR calc Af Amer: >60 mL/min (ref >60)
GFR calc non Af Amer: >60 mL/min (ref >60)
Glucose, Bld: 187 mg/dL — ABNORMAL HIGH (ref 70–99)
Potassium: 3.9 mmol/L (ref 3.5–5.1)
Sodium: 134 mmol/L — ABNORMAL LOW (ref 135–145)
Total Bilirubin: 1.1 mg/dL (ref 0.3–1.2)
Total Protein: 7.6 g/dL (ref 6.5–8.1)

## 2019-11-05 NOTE — ED Triage Notes (Signed)
Pt has right lower leg redness and new fever since Wednesday. EMS administered 1000mg  tylenol for fever 101 enroute.

## 2019-11-06 ENCOUNTER — Encounter (HOSPITAL_COMMUNITY): Payer: Self-pay | Admitting: Emergency Medicine

## 2019-11-06 ENCOUNTER — Emergency Department (HOSPITAL_COMMUNITY): Payer: Self-pay

## 2019-11-06 ENCOUNTER — Inpatient Hospital Stay (HOSPITAL_COMMUNITY)
Admission: EM | Admit: 2019-11-06 | Discharge: 2019-11-09 | DRG: 638 | Disposition: A | Discharge status: Home / Self Care | Payer: Self-pay | Attending: Internal Medicine | Admitting: Internal Medicine

## 2019-11-06 DIAGNOSIS — L0390 Cellulitis, unspecified: Secondary | ICD-10-CM | POA: Diagnosis present

## 2019-11-06 DIAGNOSIS — L03115 Cellulitis of right lower limb: Secondary | ICD-10-CM

## 2019-11-06 DIAGNOSIS — R509 Fever, unspecified: Secondary | ICD-10-CM

## 2019-11-06 DIAGNOSIS — E119 Type 2 diabetes mellitus without complications: Secondary | ICD-10-CM

## 2019-11-06 DIAGNOSIS — I2510 Atherosclerotic heart disease of native coronary artery without angina pectoris: Secondary | ICD-10-CM | POA: Diagnosis present

## 2019-11-06 DIAGNOSIS — R233 Spontaneous ecchymoses: Secondary | ICD-10-CM

## 2019-11-06 DIAGNOSIS — E1165 Type 2 diabetes mellitus with hyperglycemia: Secondary | ICD-10-CM

## 2019-11-06 HISTORY — DX: Fever, unspecified: R50.9

## 2019-11-06 LAB — CBG MONITORING, ED
Glucose-Capillary: 162 mg/dL — ABNORMAL HIGH (ref 70–99)
Glucose-Capillary: 178 mg/dL — ABNORMAL HIGH (ref 70–99)

## 2019-11-06 LAB — C-REACTIVE PROTEIN: CRP: 8 mg/dL — ABNORMAL HIGH (ref <1.0)

## 2019-11-06 LAB — LACTIC ACID, PLASMA
Lactic Acid, Venous: 1.5 mmol/L (ref 0.5–1.9)
Lactic Acid, Venous: 1.2 mmol/L (ref 0.5–1.9)
Lactic Acid, Venous: 1 mmol/L (ref 0.5–1.9)

## 2019-11-06 LAB — PROTIME-INR
INR: 1.1 (ref 0.8–1.2)
Prothrombin Time: 13.9 seconds (ref 11.4–15.2)

## 2019-11-06 LAB — URINALYSIS, ROUTINE W REFLEX MICROSCOPIC
Bacteria, UA: NONE SEEN
Bilirubin Urine: NEGATIVE
Glucose, UA: NEGATIVE mg/dL
Ketones, ur: NEGATIVE mg/dL
Leukocytes,Ua: NEGATIVE
Nitrite: NEGATIVE
Protein, ur: 30 mg/dL — AB
Specific Gravity, Urine: 1.024 (ref 1.005–1.030)
pH: 5 (ref 5.0–8.0)

## 2019-11-06 LAB — GLUCOSE, CAPILLARY
Glucose-Capillary: 150 mg/dL — ABNORMAL HIGH (ref 70–99)
Glucose-Capillary: 157 mg/dL — ABNORMAL HIGH (ref 70–99)

## 2019-11-06 LAB — HIV ANTIBODY (ROUTINE TESTING W REFLEX): HIV Screen 4th Generation wRfx: NONREACTIVE

## 2019-11-06 LAB — HEMOGLOBIN A1C
Hgb A1c MFr Bld: 8.4 % — ABNORMAL HIGH (ref 4.8–5.6)
Mean Plasma Glucose: 194.38 mg/dL

## 2019-11-06 LAB — SARS CORONAVIRUS 2 BY RT PCR (HOSPITAL ORDER, PERFORMED IN ~~LOC~~ HOSPITAL LAB): SARS Coronavirus 2: NEGATIVE

## 2019-11-06 LAB — PROCALCITONIN: Procalcitonin: 0.26 ng/mL

## 2019-11-06 LAB — SEDIMENTATION RATE: Sed Rate: 16 mm/hr (ref 0–16)

## 2019-11-06 MED ORDER — INSULIN ASPART 100 UNIT/ML ~~LOC~~ SOLN
0.0000 [IU] | Freq: Three times a day (TID) | SUBCUTANEOUS | Status: DC
  Administered 2019-11-06: 1 [IU] via SUBCUTANEOUS
  Administered 2019-11-06 – 2019-11-07 (×3): 2 [IU] via SUBCUTANEOUS
  Administered 2019-11-07: 1 [IU] via SUBCUTANEOUS
  Administered 2019-11-07 – 2019-11-09 (×5): 2 [IU] via SUBCUTANEOUS
  Filled 2019-11-06: qty 0.09

## 2019-11-06 MED ORDER — ACETAMINOPHEN 325 MG PO TABS: 650.0000 mg | ORAL_TABLET | Freq: Four times a day (QID) | ORAL | Status: DC | PRN

## 2019-11-06 MED ORDER — ENOXAPARIN SODIUM 40 MG/0.4ML ~~LOC~~ SOLN
40.0000 mg | SUBCUTANEOUS | Status: DC
  Administered 2019-11-07 – 2019-11-09 (×3): 40 mg via SUBCUTANEOUS
  Filled 2019-11-06 (×3): qty 0.4

## 2019-11-06 MED ORDER — PIPERACILLIN-TAZOBACTAM 3.375 G IVPB
3.3750 g | Freq: Three times a day (TID) | INTRAVENOUS | Status: DC
  Administered 2019-11-06 – 2019-11-09 (×9): 3.375 g via INTRAVENOUS
  Filled 2019-11-06 (×11): qty 50

## 2019-11-06 MED ORDER — ACETAMINOPHEN 500 MG PO TABS
1000.0000 mg | ORAL_TABLET | Freq: Once | ORAL | Status: AC
  Administered 2019-11-06: 1000 mg via ORAL
  Filled 2019-11-06: qty 2

## 2019-11-06 MED ORDER — ACETAMINOPHEN 650 MG RE SUPP: 650.0000 mg | Freq: Four times a day (QID) | RECTAL | Status: DC | PRN

## 2019-11-06 MED ORDER — BACITRACIN 500 UNIT/GM EX OINT
TOPICAL_OINTMENT | Freq: Two times a day (BID) | CUTANEOUS | Status: DC
  Filled 2019-11-06: qty 14
  Filled 2019-11-06: qty 0.9

## 2019-11-06 MED ORDER — ACETAMINOPHEN 325 MG PO TABS
650.0000 mg | ORAL_TABLET | Freq: Four times a day (QID) | ORAL | Status: DC | PRN
  Administered 2019-11-06 – 2019-11-07 (×3): 650 mg via ORAL
  Filled 2019-11-06 (×3): qty 2

## 2019-11-06 MED ORDER — VANCOMYCIN HCL IN DEXTROSE 1-5 GM/200ML-% IV SOLN
1000.0000 mg | Freq: Two times a day (BID) | INTRAVENOUS | Status: DC
  Administered 2019-11-06 – 2019-11-09 (×6): 1000 mg via INTRAVENOUS
  Filled 2019-11-06 (×7): qty 200

## 2019-11-06 MED ORDER — INSULIN ASPART 100 UNIT/ML ~~LOC~~ SOLN
0.0000 [IU] | Freq: Every day | SUBCUTANEOUS | Status: DC
  Filled 2019-11-06: qty 0.05

## 2019-11-06 MED ORDER — SODIUM CHLORIDE 0.9 % IV SOLN: INTRAVENOUS | Status: DC

## 2019-11-06 MED ORDER — TETANUS-DIPHTH-ACELL PERTUSSIS 5-2.5-18.5 LF-MCG/0.5 IM SUSP: 0.5000 mL | Freq: Once | INTRAMUSCULAR | Status: DC

## 2019-11-06 MED ORDER — ASPIRIN EC 81 MG PO TBEC
81.0000 mg | DELAYED_RELEASE_TABLET | Freq: Every day | ORAL | Status: DC
  Administered 2019-11-06 – 2019-11-09 (×4): 81 mg via ORAL
  Filled 2019-11-06 (×5): qty 1

## 2019-11-06 MED ORDER — VANCOMYCIN HCL IN DEXTROSE 1-5 GM/200ML-% IV SOLN
1000.0000 mg | Freq: Once | INTRAVENOUS | Status: AC
  Administered 2019-11-06: 1000 mg via INTRAVENOUS
  Filled 2019-11-06: qty 200

## 2019-11-06 MED ORDER — METOPROLOL TARTRATE 12.5 MG HALF TABLET
12.5000 mg | ORAL_TABLET | Freq: Two times a day (BID) | ORAL | Status: DC
  Administered 2019-11-06 – 2019-11-09 (×6): 12.5 mg via ORAL
  Filled 2019-11-06 (×6): qty 1

## 2019-11-06 MED ORDER — PIPERACILLIN-TAZOBACTAM 3.375 G IVPB 30 MIN
3.3750 g | Freq: Once | INTRAVENOUS | Status: AC
  Administered 2019-11-06: 3.375 g via INTRAVENOUS
  Filled 2019-11-06: qty 50

## 2019-11-06 NOTE — Plan of Care (Signed)
Plan of care 
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
## 2019-11-06 NOTE — Progress Notes (Signed)
Patient to floor VS obtained temp 103.2, mews yellow. Dr Benny Lennert paged per Koleen Nimrod RN

## 2019-11-06 NOTE — Plan of Care (Signed)
  Problem: Nutrition: Goal: Adequate nutrition will be maintained Outcome: Progressing   Problem: Skin Integrity: Goal: Skin integrity will improve Outcome: Progressing

## 2019-11-06 NOTE — ED Notes (Signed)
Patient transported to X-ray 

## 2019-11-06 NOTE — ED Provider Notes (Signed)
[facility] DEPT Provider Note   CSN: [id number] Arrival date & time: 11/05/19  2157     History Chief Complaint  Patient presents with   Cellulitis    Kurt Mathis is a 59 y.o. male.  The history is provided by the patient.  Illness Location:  Right shin Quality:  Redness Severity:  Severe Onset quality:  Gradual Duration:  2 days Timing:  Constant Progression:  Worsening Chronicity:  New Context:  Diabetic with fevers to 102 since Wednesday  Relieved by:  Nothing  Worsened by:  Time  Ineffective treatments:  None tried  Associated symptoms: fever   Associated symptoms: no abdominal pain, no chest pain, no congestion, no cough, no diarrhea, no fatigue, no headaches, no loss of consciousness, no shortness of breath and no vomiting   Risk factors:  Diabetic patient  Patient with diabetes with 2 days of fever and redness of the RLE not with lymphangitic spread.  Denies tick exposure.       Past Medical History:  Diagnosis Date   Cardiomyopathy [facility])    Coronary artery disease    a. 12/2015: NSTEMI w/ cath showing severe multivessel disease. CABG recommended. b. CABG on 01/01/2016 w/ LIMA-LAD, SVG-RCA-PDA, SVG-D1, and Left Radial-OM1.   Diabetes mellitus without complication ([place])    a. A1c elevated to 11.7 in 12/2015.   HLD (hyperlipidemia)    NSTEMI (non-ST elevated myocardial infarction) [facility])     Patient Active Problem List   Diagnosis Date Noted   GERD (gastroesophageal reflux disease) 03/12/2017   Neuropathy 07/24/2016   Healthcare maintenance 02/16/2016   Cardiomyopathy, ischemic    CAD (multiple vessel) s/p CABG x5    Dyslipidemia    Essential hypertension    NSTEMI (non-ST elevated myocardial infarction) ([place]) 12/23/2015   Diabetes mellitus type 2 in nonobese ([employer]) 12/23/2015    Past Surgical History:  Procedure Laterality Date   CARDIAC CATHETERIZATION N/A 12/25/2015   Procedure: Left Heart  Cath and Coronary Angiography;  Surgeon: Belva Crome, MD;  Location: [place] CV LAB;  Service: Cardiovascular;  Laterality: N/A;   CARDIAC CATHETERIZATION N/A 12/26/2015   Procedure: Intravascular Pressure Wire/FFR Study;  Surgeon: Burnell Blanks, MD;  Location: [place] CV LAB;  Service: Cardiovascular;  Laterality: N/A;   CORONARY ARTERY BYPASS GRAFT N/A 01/01/2016   Procedure: CORONARY ARTERY BYPASS GRAFTING (CABG), ON PUMP, TIMES 5, USING LEFT RADIAL ARTERY AND LEFT INTERNAL MAMMARY ARTERY, BILATERAL GREATER SAPHENOUS VEINS HARVESTED ENDOSCOPICALLY;  Surgeon: [name], MD;  Location: [place];  Service: Open Heart Surgery;  Laterality: N/A;  LIMA-LAD; SEQ SVG-RCA-PD; SVG-DIAG; LEFT RADIAL-OM   FINGER SURGERY Right    RADIAL ARTERY HARVEST Left 01/01/2016   Procedure: LEFT RADIAL ARTERY HARVEST;  Surgeon: [name], MD;  Location: [place];  Service: Open Heart Surgery;  Laterality: Left;   TEE WITHOUT CARDIOVERSION N/A 01/01/2016   Procedure: TRANSESOPHAGEAL ECHOCARDIOGRAM (TEE);  Surgeon: [name], MD;  Location: [place];  Service: Open Heart Surgery;  Laterality: N/A;       Family History  Problem Relation Age of Onset   Hypertension Mother    Heart attack Neg Hx     Social History   Tobacco Use   Smoking status: Never Smoker   Smokeless tobacco: Never Used  Vaping Use   Vaping Use: Never used  Substance Use Topics   Alcohol use: Not Currently   Drug use: No    Home Medications Prior to Admission medications  Medication Sig Start Date End Date Taking? Authorizing Provider  aspirin EC 81 MG tablet Take 1 tablet (81 mg total) by mouth daily. 01/18/19   Santos-Sanchez, Merlene Morse, MD  atorvastatin (LIPITOR) 40 MG tablet TAKE 1 TABLET BY MOUTH ONCE DAILY AT  6  PM 01/18/19   Welford Roche, MD  blood glucose meter kit and supplies Dispense based on patient and insurance preference. Use up to four times daily as directed.  (FOR ICD-9 250.00, 250.01). 01/05/16   Barrett, Erin R, PA-C  metFORMIN (GLUCOPHAGE) 500 MG tablet TAKE 1 TABLET BY MOUTH TWICE DAILY WITH A MEAL 11/02/19   Aslam, Loralyn Freshwater, MD  metoprolol tartrate (LOPRESSOR) 25 MG tablet Take 1/2 (one-half) tablet by mouth twice daily 08/30/19   Welford Roche, MD    Allergies    Patient has no known allergies.  Review of Systems   Review of Systems  Constitutional: Positive for appetite change and fever. Negative for fatigue.  HENT: Negative for congestion.   Eyes: Negative for visual disturbance.  Respiratory: Negative for cough and shortness of breath.   Cardiovascular: Negative for chest pain.  Gastrointestinal: Negative for abdominal pain, diarrhea and vomiting.  Genitourinary: Negative for difficulty urinating.  Musculoskeletal: Negative for arthralgias.  Skin: Positive for color change and wound.  Neurological: Negative for loss of consciousness and headaches.  Hematological: Negative for adenopathy.  Psychiatric/Behavioral: Negative for agitation.  All other systems reviewed and are negative.   Physical Exam Updated Vital Signs BP (!) 154/75    Pulse 87    Temp 99.5 F (37.5 C) (Oral)    Resp 20    Ht 5' 10"  (1.778 m)    Wt 79.4 kg    SpO2 100%    BMI 25.11 kg/m   Physical Exam Vitals and nursing note reviewed.  Constitutional:      Appearance: Normal appearance. He is not diaphoretic.  HENT:     Head: Normocephalic and atraumatic.     Nose: Nose normal.  Eyes:     Conjunctiva/sclera: Conjunctivae normal.     Pupils: Pupils are equal, round, and reactive to light.  Cardiovascular:     Rate and Rhythm: Normal rate and regular rhythm.     Pulses: Normal pulses.     Heart sounds: Normal heart sounds.  Pulmonary:     Effort: Pulmonary effort is normal.     Breath sounds: Normal breath sounds.  Abdominal:     General: Abdomen is flat. Bowel sounds are normal.     Tenderness: There is no abdominal tenderness. There is no  guarding or rebound.  Musculoskeletal:        General: Normal range of motion.     Cervical back: Normal range of motion and neck supple.  Skin:    General: Skin is warm and dry.     Capillary Refill: Capillary refill takes less than 2 seconds.       Neurological:     General: No focal deficit present.     Mental Status: He is alert and oriented to person, place, and time.     Deep Tendon Reflexes: Reflexes normal.  Psychiatric:        Mood and Affect: Mood normal.        Behavior: Behavior normal.           ED Results / Procedures / Treatments   Labs (all labs ordered are listed, but only abnormal results are displayed) Results for orders placed or performed during the hospital encounter of  11/06/19  CBC with Differential  Result Value Ref Range   WBC 8.7 4.0 - 10.5 K/uL   RBC 4.41 4.22 - 5.81 MIL/uL   Hemoglobin 13.6 13.0 - 17.0 g/dL   HCT 41.0 39 - 52 %   MCV 93.0 80.0 - 100.0 fL   MCH 30.8 26.0 - 34.0 pg   MCHC 33.2 30.0 - 36.0 g/dL   RDW 13.0 11.5 - 15.5 %   Platelets 219 150 - 400 K/uL   nRBC 0.0 0.0 - 0.2 %   Neutrophils Relative % 72 %   Neutro Abs 6.1 1.7 - 7.7 K/uL   Lymphocytes Relative 18 %   Lymphs Abs 1.6 0.7 - 4.0 K/uL   Monocytes Relative 10 %   Monocytes Absolute 0.9 0 - 1 K/uL   Eosinophils Relative 0 %   Eosinophils Absolute 0.0 0 - 0 K/uL   Basophils Relative 0 %   Basophils Absolute 0.0 0 - 0 K/uL   Immature Granulocytes 0 %   Abs Immature Granulocytes 0.03 0.00 - 0.07 K/uL  Comprehensive metabolic panel  Result Value Ref Range   Sodium 134 (L) 135 - 145 mmol/L   Potassium 3.9 3.5 - 5.1 mmol/L   Chloride 97 (L) 98 - 111 mmol/L   CO2 23 22 - 32 mmol/L   Glucose, Bld 187 (H) 70 - 99 mg/dL   BUN 17 6 - 20 mg/dL   Creatinine, Ser 0.90 0.61 - 1.24 mg/dL   Calcium 8.7 (L) 8.9 - 10.3 mg/dL   Total Protein 7.6 6.5 - 8.1 g/dL   Albumin 4.2 3.5 - 5.0 g/dL   AST 24 15 - 41 U/L   ALT 24 0 - 44 U/L   Alkaline Phosphatase 52 38 - 126 U/L    Total Bilirubin 1.1 0.3 - 1.2 mg/dL   GFR calc non Af Amer >60 >60 mL/min   GFR calc Af Amer >60 >60 mL/min   Anion gap 14 5 - 15   DG Chest Portable 1 View  Result Date: 11/06/2019 CLINICAL DATA:  Fevers EXAM: PORTABLE CHEST 1 VIEW COMPARISON:  02/06/2016 FINDINGS: Cardiac shadow is within normal limits. Postsurgical changes are again seen. The lungs are clear. No acute bony abnormality is noted. IMPRESSION: No acute abnormality noted. Electronically Signed   By: Inez Catalina M.D.   On: 11/06/2019 03:37    EKG None  Radiology DG Chest Portable 1 View  Result Date: 11/06/2019 CLINICAL DATA:  Fevers EXAM: PORTABLE CHEST 1 VIEW COMPARISON:  02/06/2016 FINDINGS: Cardiac shadow is within normal limits. Postsurgical changes are again seen. The lungs are clear. No acute bony abnormality is noted. IMPRESSION: No acute abnormality noted. Electronically Signed   By: Inez Catalina M.D.   On: 11/06/2019 03:37    Procedures Procedures (including critical care time)  Medications Ordered in ED Medications  vancomycin (VANCOCIN) IVPB 1000 mg/200 mL premix (has no administration in time range)  piperacillin-tazobactam (ZOSYN) IVPB 3.375 g (has no administration in time range)    ED Course  I have reviewed the triage vital signs and the nursing notes.  Pertinent labs & imaging results that were available during my care of the patient were reviewed by me and considered in my medical decision making (see chart for details).    No gas in the soft tissues.  Will admit to cellulitis.  No signs of sepsis at this time.   Final Clinical Impression(s) / ED Diagnoses Final diagnoses:  Cellulitis of right lower extremity  Admit to medicine    Emmamarie Kluender, MD 11/06/19 3881
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
## 2019-11-06 NOTE — Progress Notes (Signed)
Pharmacy Antibiotic Note  Kurt Mathis is a 59 y.o. male admitted on 11/06/2019 with cellulitis.  Pharmacy has been consulted for vanc/zosyn dosing.  Plan: Vanc 1g IV q12 - goal trough 10-15 Zosyn 3.375g IV q8 (extended interval infusion) Daily SCr  Height: 5\' 10"  (177.8 cm) Weight: 79.4 kg (175 lb) IBW/kg (Calculated) : 73  Temp (24hrs), Avg:99.1 F (37.3 C), Min:98.2 F (36.8 C), Max:99.5 F (37.5 C)  Recent Labs  Lab 11/05/19 2218 11/06/19 0349  WBC 8.7  --   CREATININE 0.90  --   LATICACIDVEN  --  1.5    Estimated Creatinine Clearance: 92.4 mL/min (by C-G formula based on SCr of 0.9 mg/dL).    No Known Allergies   Thank you for allowing pharmacy to be a part of this patient's care.  Kara Mead 11/06/2019 5:59 AM
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
## 2019-11-07 DIAGNOSIS — R233 Spontaneous ecchymoses: Secondary | ICD-10-CM

## 2019-11-07 LAB — CBC WITH DIFFERENTIAL/PLATELET
Abs Immature Granulocytes: 0.02 10*3/uL (ref 0.00–0.07)
Basophils Absolute: 0 10*3/uL (ref 0.0–0.1)
Basophils Relative: 0 %
Eosinophils Absolute: 0 10*3/uL (ref 0.0–0.5)
Eosinophils Relative: 0 %
HCT: 36.2 % — ABNORMAL LOW (ref 39.0–52.0)
Hemoglobin: 12.1 g/dL — ABNORMAL LOW (ref 13.0–17.0)
Immature Granulocytes: 0 %
Lymphocytes Relative: 26 %
Lymphs Abs: 1.9 10*3/uL (ref 0.7–4.0)
MCH: 30.9 pg (ref 26.0–34.0)
MCHC: 33.4 g/dL (ref 30.0–36.0)
MCV: 92.6 fL (ref 80.0–100.0)
Monocytes Absolute: 1 10*3/uL (ref 0.1–1.0)
Monocytes Relative: 13 %
Neutro Abs: 4.5 10*3/uL (ref 1.7–7.7)
Neutrophils Relative %: 61 %
Platelets: 166 10*3/uL (ref 150–400)
RBC: 3.91 MIL/uL — ABNORMAL LOW (ref 4.22–5.81)
RDW: 13.1 % (ref 11.5–15.5)
WBC: 7.4 10*3/uL (ref 4.0–10.5)
nRBC: 0 % (ref 0.0–0.2)

## 2019-11-07 LAB — CREATININE, SERUM
Creatinine, Ser: 0.97 mg/dL (ref 0.61–1.24)
GFR calc Af Amer: >60 mL/min (ref >60)
GFR calc non Af Amer: >60 mL/min (ref >60)

## 2019-11-07 LAB — GLUCOSE, CAPILLARY
Glucose-Capillary: 180 mg/dL — ABNORMAL HIGH (ref 70–99)
Glucose-Capillary: 137 mg/dL — ABNORMAL HIGH (ref 70–99)
Glucose-Capillary: 191 mg/dL — ABNORMAL HIGH (ref 70–99)
Glucose-Capillary: 180 mg/dL — ABNORMAL HIGH (ref 70–99)

## 2019-11-07 LAB — URINE CULTURE
Culture: NO GROWTH
Special Requests: NORMAL

## 2019-11-07 LAB — PROCALCITONIN: Procalcitonin: 0.23 ng/mL

## 2019-11-07 MED ORDER — MENTHOL 3 MG MT LOZG
1.0000 | LOZENGE | OROMUCOSAL | Status: DC | PRN
  Filled 2019-11-07: qty 9
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
## 2019-11-08 LAB — CREATININE, SERUM
Creatinine, Ser: 0.83 mg/dL (ref 0.61–1.24)
GFR calc Af Amer: >60 mL/min (ref >60)
GFR calc non Af Amer: >60 mL/min (ref >60)

## 2019-11-08 LAB — MPO/PR-3 (ANCA) ANTIBODIES
ANCA Proteinase 3: <3.5 U/mL (ref 0.0–3.5)
Myeloperoxidase Abs: <9 U/mL (ref 0.0–9.0)

## 2019-11-08 LAB — ANCA TITERS
Atypical P-ANCA titer: <1:20 {titer}
C-ANCA: <1:20 {titer}
P-ANCA: <1:20 {titer}

## 2019-11-08 LAB — GLUCOSE, CAPILLARY
Glucose-Capillary: 173 mg/dL — ABNORMAL HIGH (ref 70–99)
Glucose-Capillary: 152 mg/dL — ABNORMAL HIGH (ref 70–99)
Glucose-Capillary: 178 mg/dL — ABNORMAL HIGH (ref 70–99)
Glucose-Capillary: 197 mg/dL — ABNORMAL HIGH (ref 70–99)

## 2019-11-08 LAB — PROCALCITONIN: Procalcitonin: <0.1 ng/mL

## 2019-11-08 MED ORDER — ATORVASTATIN CALCIUM 40 MG PO TABS
40.0000 mg | ORAL_TABLET | Freq: Every day | ORAL | Status: DC
  Administered 2019-11-08 – 2019-11-09 (×2): 40 mg via ORAL
  Filled 2019-11-08 (×2): qty 1
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
## 2019-11-08 NOTE — Plan of Care (Signed)
  Problem: Health Behavior/Discharge Planning: Goal: Ability to manage health-related needs will improve Outcome: Progressing   Problem: Clinical Measurements: Goal: Ability to maintain clinical measurements within normal limits will improve Outcome: Progressing Goal: Will remain free from infection Outcome: Progressing Goal: Diagnostic test results will improve Outcome: Progressing   

## 2019-11-09 LAB — GLUCOSE, CAPILLARY
Glucose-Capillary: 171 mg/dL — ABNORMAL HIGH (ref 70–99)
Glucose-Capillary: 171 mg/dL — ABNORMAL HIGH (ref 70–99)

## 2019-11-09 LAB — CREATININE, SERUM
Creatinine, Ser: 0.73 mg/dL (ref 0.61–1.24)
GFR calc Af Amer: >60 mL/min (ref >60)
GFR calc non Af Amer: >60 mL/min (ref >60)

## 2019-11-09 MED ORDER — BACITRACIN 500 UNIT/GM EX OINT: TOPICAL_OINTMENT | Freq: Two times a day (BID) | CUTANEOUS | 0 refills | Status: AC

## 2019-11-09 MED ORDER — AMOXICILLIN-POT CLAVULANATE 875-125 MG PO TABS
1.0000 | ORAL_TABLET | Freq: Two times a day (BID) | ORAL | 0 refills | Status: AC
Start: 2019-11-09 — End: 2019-11-20
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
## 2019-11-10 LAB — RMSF, IGG, IFA: RMSF, IGG, IFA: 1:64 {titer} — ABNORMAL HIGH

## 2019-11-10 LAB — ROCKY MTN SPOTTED FVR ABS PNL(IGG+IGM)
RMSF IgG: POSITIVE — AB
RMSF IgM: 0.8 index (ref 0.00–0.89)

## 2019-11-11 LAB — CULTURE, BLOOD (ROUTINE X 2)
Culture: NO GROWTH
Culture: NO GROWTH
Special Requests: ADEQUATE
Special Requests: ADEQUATE

## 2019-11-24 ENCOUNTER — Encounter: Payer: Self-pay | Admitting: Student

## 2019-11-24 ENCOUNTER — Ambulatory Visit: Payer: Self-pay | Admitting: Student

## 2019-11-24 DIAGNOSIS — Z0001 Encounter for general adult medical examination with abnormal findings: Secondary | ICD-10-CM

## 2019-11-24 DIAGNOSIS — E119 Type 2 diabetes mellitus without complications: Secondary | ICD-10-CM

## 2019-11-24 DIAGNOSIS — Z0000 Encounter for general adult medical examination without abnormal findings: Secondary | ICD-10-CM

## 2019-11-24 MED ORDER — METFORMIN HCL 500 MG PO TABS: 1000.0000 mg | ORAL_TABLET | Freq: Two times a day (BID) | ORAL | 0 refills | Status: DC

## 2019-11-24 MED ORDER — METFORMIN HCL 1000 MG PO TABS: 1000.0000 mg | ORAL_TABLET | Freq: Two times a day (BID) | ORAL | 3 refills | Status: DC

## 2019-11-24 NOTE — Patient Instructions (Signed)
Thank you for allowing [place] to assist in your healthcare today! It was nice meeting you.  Today we discussed:  1) [facility] Admission Follow Up - I am glad you are feeling better! Your leg has continued to improve.   2) Diabetes - Your A1c was 8.4 in the hospital. We will be increasing your metformin. Please return to clinic in 3 months and we will recheck your A1c.   If you have any questions or concerns, please call our clinic!

## 2019-11-24 NOTE — Progress Notes (Signed)
CC: [facility] Admission F/U   HPI:  [name] is a 59 y.o. M with a PMHx of T2DM, CAD with NSTEMI and CABG, and HLD who presents to the clinic for a post hospital admission. The patient was admitted on 07/21 at [facility] for a red rash on the inside of his right lower leg along with associated fever, malaise, and generalized weakness. He notes at first the area was itchy and suddenly he noticed the area was bright red and painful. He also notes that a few days prior he drank old juice from a fridge at work and after drinking this he noted feeling nauseated and ill. He vomited 1 x and felt better.   He was admitted to the hospitalist service, CBC, CMP, BCx2, COVID, UC, procalcitionin, lactic acid, HIV were all performed and returned within normal limits. His A1c while admitted was 8.4. He was started on Vanc and Zosyn while admtited. During his hospital the patient became afebrile, his tachycardia improved.Chest x-ray was negative and x-ray of right tib/fib revealed soft tissue edema versus cellulitis. No soft tissue air and no evidence of osteomyelitis were reported. Patient improved overall and suspected diagnosis was erysipelas related to diabetes mellitus.   Patient states he is feeling better and that his fatigue has improved since he has started eating regular meals. He notes he eats vegetables, fruits, and avoids fried foods. He has no complaints at this time.  Past Medical History:  Diagnosis Date  . Cardiomyopathy ([place])   . Coronary artery disease    a. 12/2015: NSTEMI w/ cath showing severe multivessel disease. CABG recommended. b. CABG on 01/01/2016 w/ LIMA-LAD, SVG-RCA-PDA, SVG-D1, and Left Radial-OM1.  . Diabetes mellitus without complication ([place])    a. A1c elevated to 11.7 in 12/2015.  Marland Kitchen HLD (hyperlipidemia)   . NSTEMI (non-ST elevated myocardial infarction) ([place])    Review of Systems:  As per HPI  Physical Exam:  Vitals:   11/24/19 1328  BP:  (!) 143/62  Pulse: 72  Temp: 98.1 F (36.7 C)  TempSrc: Oral  SpO2: 99%  Weight: 157 lb 8 oz (71.4 kg)   Physical Exam Vitals and nursing note reviewed.  Constitutional:      General: He is not in acute distress.    Appearance: Normal appearance. He is not ill-appearing or toxic-appearing.  Eyes:     Extraocular Movements: Extraocular movements intact.  Cardiovascular:     Rate and Rhythm: Normal rate and regular rhythm.     Pulses: Normal pulses.     Heart sounds: Normal heart sounds. No murmur heard.  No gallop.   Pulmonary:     Effort: Pulmonary effort is normal. No respiratory distress.     Breath sounds: Normal breath sounds. No stridor. No wheezing or rales.  Musculoskeletal:     Cervical back: Normal range of motion.  Skin:    General: Skin is warm and dry.     Comments: Medial aspect of lower leg, healing well. No erythema, non-tender, not warm  Neurological:     General: No focal deficit present.     Mental Status: He is alert and oriented to person, place, and time. Mental status is at baseline.  Psychiatric:        Mood and Affect: Mood normal.        Behavior: Behavior normal.     Assessment & Plan:   See Encounters Tab for problem based charting.  Patient discussed with and seen by Dr. Heber Kinsman

## 2019-11-24 NOTE — Assessment & Plan Note (Addendum)
Patient's recent A1c of 8.4. Recently admitted for suspected erysipelas related to his dm. Patient has been compliant with his current diabetic regimen of lifestyle modifications as well as metformin 500 BID. Patient's A1c levels have fluctuated recently between 8.4-7.7. Further discussion was had emphasizing need to keep up the good work with his diet.   A/P - Will increase metformin to 1000 mg BID.  - Will recheck A1c in 3 months.

## 2019-11-24 NOTE — Assessment & Plan Note (Signed)
Patient states he was scheduled to receive his COVID vaccine but was hospitalized. He will attempt to get the vaccine next week.

## 2019-11-26 NOTE — Progress Notes (Signed)
Internal Medicine Clinic Attending  I saw and evaluated the patient.  I personally confirmed the key portions of the history and exam documented by Dr. Katsadouros and I reviewed pertinent patient test results.  The assessment, diagnosis, and plan were formulated together and I agree with the documentation in the resident's note.  

## 2020-02-07 ENCOUNTER — Telehealth: Payer: Self-pay

## 2020-02-07 NOTE — Telephone Encounter (Signed)
RTC to pharmacy, spoke with the pharmacist, Sheran Spine.  He states he can't run the RX for metformin through on Dr. Johnney Ou because he gets the following message:  "The Providers license is not active with the state".  Dr. Heber Mariposa is attending today and his name/ NPI number given for metformin RX.  Will forward to office manager to make her aware. Thank you, SChaplin, RN,BSN

## 2020-02-07 NOTE — Telephone Encounter (Signed)
Kurt Mathis with [facility] requesting to speak with a nurse about metFORMIN (GLUCOPHAGE) 1000 MG tablet. Please call pt back.

## 2020-02-10 ENCOUNTER — Other Ambulatory Visit: Payer: Self-pay

## 2020-02-10 DIAGNOSIS — I214 Non-ST elevation (NSTEMI) myocardial infarction: Secondary | ICD-10-CM

## 2020-02-10 MED ORDER — ATORVASTATIN CALCIUM 40 MG PO TABS: 40.0000 mg | ORAL_TABLET | Freq: Every day | ORAL | 3 refills | Status: DC

## 2020-02-15 ENCOUNTER — Other Ambulatory Visit: Payer: Self-pay | Admitting: *Deleted

## 2020-02-15 DIAGNOSIS — I214 Non-ST elevation (NSTEMI) myocardial infarction: Secondary | ICD-10-CM

## 2020-02-15 MED ORDER — METOPROLOL TARTRATE 25 MG PO TABS: ORAL_TABLET | ORAL | 0 refills | Status: DC

## 2020-03-30 ENCOUNTER — Ambulatory Visit: Payer: Self-pay | Admitting: Internal Medicine

## 2020-03-30 ENCOUNTER — Encounter: Payer: Self-pay | Admitting: Internal Medicine

## 2020-03-30 VITALS — BP 142/71 | HR 55 | Temp 97.8°F | Ht 70.0 in | Wt 163.5 lb

## 2020-03-30 DIAGNOSIS — E119 Type 2 diabetes mellitus without complications: Secondary | ICD-10-CM

## 2020-03-30 DIAGNOSIS — L03115 Cellulitis of right lower limb: Secondary | ICD-10-CM

## 2020-03-30 DIAGNOSIS — I10 Essential (primary) hypertension: Secondary | ICD-10-CM

## 2020-03-30 LAB — POCT GLYCOSYLATED HEMOGLOBIN (HGB A1C): Hemoglobin A1C: 8.4 % — AB (ref 4.0–5.6)

## 2020-03-30 LAB — GLUCOSE, CAPILLARY: Glucose-Capillary: 188 mg/dL — ABNORMAL HIGH (ref 70–99)

## 2020-03-30 MED ORDER — LISINOPRIL 10 MG PO TABS: 10.0000 mg | ORAL_TABLET | Freq: Every day | ORAL | 2 refills | Status: DC

## 2020-03-30 MED ORDER — METFORMIN HCL 1000 MG PO TABS: 1000.0000 mg | ORAL_TABLET | Freq: Two times a day (BID) | ORAL | 3 refills | Status: DC

## 2020-03-30 MED ORDER — EMPAGLIFLOZIN 10 MG PO TABS: 10.0000 mg | ORAL_TABLET | Freq: Every day | ORAL | 3 refills | Status: DC

## 2020-03-30 NOTE — Assessment & Plan Note (Signed)
Leg pain: It appears that he was admitted to the hospital from November 06, 2019 to December 06, 2019 when he complained of a 3-day history of fever malaise and weakness.  He was found to have area of redness of his right lower extremity.  He was treated with IV vancomycin and Zosyn and subsequently discharged on Augmentin for 11 days.   Patient states that he was worried his cellulitis was returning because 1 day last week he felt chills.  Presently, he denies fevers, chills, nausea, vomiting, right lower extremity swelling, erythema or pain.    On physical exams, right lower extremity is not warm to touch, there is no erythema or swelling.  And no evidence of fever on his vital check.   Assessment and plan: I was able to reassure patient that there is currently no signs of developing cellulitis however we have to be aggressive in managing his diabetes and HTN  -Return to clinic in 1 to 2 weeks for reexamination of his right lower extremity

## 2020-03-30 NOTE — Assessment & Plan Note (Signed)
Hypertension: Not at goal. Goal <130/80  BP Readings from Last 3 Encounters:  03/30/20 (!) 142/71  11/24/19 (!) 143/62  11/09/19 (!) 127/59    Plan: -Continue metoprolol 25 mg twice daily -Add lisinopril 10 mg daily and have repeat BMP and hypertension follow-up in 1-2 weeks.  His last BMP performed in July did not show any renal insufficiency

## 2020-03-30 NOTE — Progress Notes (Signed)
   CC: Leg pain  HPI:  Mr.Oron Torres-Castillo is a 59 y.o. with medical history significant for type 2 diabetes mellitus, CAD status post CABG, hyperlipidemia here for evaluation of leg pain.  Please see problem based charting for further details.  Past Medical History:  Diagnosis Date  . Cardiomyopathy ([place])   . Coronary artery disease    a. 12/2015: NSTEMI w/ cath showing severe multivessel disease. CABG recommended. b. CABG on 01/01/2016 w/ LIMA-LAD, SVG-RCA-PDA, SVG-D1, and Left Radial-OM1.  . Diabetes mellitus without complication ([place])    a. A1c elevated to 11.7 in 12/2015.  Marland Kitchen HLD (hyperlipidemia)   . NSTEMI (non-ST elevated myocardial infarction) ([place])    Review of Systems:  As per HPI  Physical Exam:  Vitals:   03/30/20 0906  BP: (!) 172/79  Pulse: 61  Temp: 97.8 F (36.6 C)  TempSrc: Oral  SpO2: 99%  Weight: 163 lb 8 oz (74.2 kg)  Height: 5\' 10"  (1.778 m)   Physical Exam Vitals and nursing note reviewed.  Cardiovascular:     Rate and Rhythm: Normal rate.     Heart sounds: Normal heart sounds.  Pulmonary:     Breath sounds: Normal breath sounds.  Musculoskeletal:        General: No swelling, tenderness, deformity or signs of injury. Normal range of motion.     Right lower leg: No edema.     Left lower leg: No edema.  Skin:    Findings: No erythema.    OLD (July)---> NOW      Assessment & Plan:   See Encounters Tab for problem based charting.  Patient discussed with Dr. Daryll Drown

## 2020-03-30 NOTE — Patient Instructions (Signed)
[name],  Thanks for seeing me today.  As we discussed, I do not see any sign of soft tissue infection on your right leg.  We do have to get aggressive with your diabetes.  1.  I am adding a new diabetes medication called Jardiance.  You take 1 pill once a day 2.  For your blood pressure, I am adding a new medication called lisinopril 10 mg daily 3.  Please come back to the clinic in 1 to 2 weeks for [place] to check on your right leg and your blood pressure.  Take care! Dr. Eileen Stanford  Please call the internal medicine center clinic if you have any questions or concerns, we may be able to help and keep you from a long and expensive emergency room wait. Our clinic and after hours phone number is [phone], the best time to call is Monday through Friday 9 am to 4 pm but there is always someone available 24/7 if you have an emergency. If you need medication refills please notify your pharmacy one week in advance and they will send [place] a request.   If you have not gotten the COVID vaccine, I recommend doing so:  You may get it at your local [facility] or [facility] OR To schedule an appointment for a COVID vaccine or be added to the vaccine wait list: Go to [email or web address]   OR Go to [email or web address]                  OR Call [phone]                                     OR Call [phone] and select Option 2

## 2020-03-30 NOTE — Assessment & Plan Note (Signed)
Type 2 diabetes mellitus: Uncontrolled.  His last A1c was 8.4%.  A1c today is 8.4% he is only on Metformin 1000 mg twice daily however that was some confusion were he states he is only taking 500 mg twice daily.  His home capillary blood glucose ranges from 140-180.  Assessment and plan: We will recheck his hemoglobin A1c today however I do believe that he would benefit from an SGLT2 inhibitor given his history of coronary artery disease now status post CABG.  -Continue Metformin 1000 mg twice daily -Add Jardiance 10 mg daily

## 2020-04-04 NOTE — Progress Notes (Signed)
Internal Medicine Clinic Attending  Case discussed with Dr. Agyei  At the time of the visit.  We reviewed the resident's history and exam and pertinent patient test results.  I agree with the assessment, diagnosis, and plan of care documented in the resident's note.  

## 2020-04-18 ENCOUNTER — Other Ambulatory Visit: Payer: Self-pay

## 2020-04-18 ENCOUNTER — Encounter: Payer: Self-pay | Admitting: Student

## 2020-04-18 ENCOUNTER — Other Ambulatory Visit: Payer: Self-pay | Admitting: Student

## 2020-04-18 ENCOUNTER — Ambulatory Visit: Payer: Self-pay | Admitting: Student

## 2020-04-18 VITALS — BP 146/81 | HR 68 | Temp 98.2°F | Ht 70.0 in | Wt 168.2 lb

## 2020-04-18 DIAGNOSIS — L03115 Cellulitis of right lower limb: Secondary | ICD-10-CM

## 2020-04-18 DIAGNOSIS — K219 Gastro-esophageal reflux disease without esophagitis: Secondary | ICD-10-CM

## 2020-04-18 DIAGNOSIS — R12 Heartburn: Secondary | ICD-10-CM

## 2020-04-18 DIAGNOSIS — I10 Essential (primary) hypertension: Secondary | ICD-10-CM

## 2020-04-18 DIAGNOSIS — E119 Type 2 diabetes mellitus without complications: Secondary | ICD-10-CM

## 2020-04-18 MED ORDER — INVOKAMET 50-1000 MG PO TABS: 1.0000 | ORAL_TABLET | Freq: Two times a day (BID) | ORAL | 1 refills | Status: DC

## 2020-04-18 MED ORDER — OMEPRAZOLE 20 MG PO CPDR: 20.0000 mg | DELAYED_RELEASE_CAPSULE | Freq: Every day | ORAL | 1 refills | Status: DC

## 2020-04-18 MED ORDER — LISINOPRIL-HYDROCHLOROTHIAZIDE 10-12.5 MG PO TABS: 1.0000 | ORAL_TABLET | Freq: Every day | ORAL | 11 refills | Status: DC

## 2020-04-18 MED FILL — INVOKAMET 50-1,000 MG TAB: 50-1000 | 30 days supply | Qty: 60 | Fill #0

## 2020-04-18 MED FILL — OMEPRAZOLE DR 20 MG CAPSULE: 20 | 30 days supply | Qty: 30 | Fill #0

## 2020-04-18 MED FILL — LISINOPRIL-HCTZ 10-12.5 MG: 10-12.5 | 30 days supply | Qty: 30 | Fill #0

## 2020-04-18 NOTE — Patient Instructions (Addendum)
[name],   I am glad to hear that you are doing well. The bitter taste in your mouth and throat discomfort are likely due to heartburn (GERD). I have prescribed Omeprazole to the pharmacy. Please take 1 pill daily until your prescription runs out.   Your blood pressure is also slightly high.   Please: CONTINUE taking Metoprolol (Lopressor) 1 pill in the morning, 1 pill in the evening STOP taking lisinopril and STOP taking Metformin  START taking Invokamet 1 pill in the morning, 1 pill in the evening  START taking Zestoretic 1 pill in the morning   Please return for a follow up visit in 2 weeks or sooner if you have any questions or concerns, you may also call [phone].  Thank you and take care,  Dr. Konrad Penta   Sr. Torres-Castillo,  Me alegra saber que lo ests [employer]. El Astronomer en la boca y las molestias en la garganta probablemente se deban a la acidez estomacal [place]). Le he recetado Omeprazol a Engineer, building services. Melissa que se le acabe la receta.  Su presin arterial tambin es ligeramente alta.  Por favor: CONTINUAR tomando Metoprolol (Lopressor) 1 pastilla por la maana, 1 pastilla por la noche DEJAR de tomar lisinopril y EMCOR de tomar metformina COMIENCE a tomar Invokamet 1 pastilla por la maana, 1 pastilla por la noche EMPIEZA a tomar Zestoretic 1 pastilla por la maana  Regrese para una visita de seguimiento en 2 semanas o antes si tiene alguna pregunta o inquietud, tambin puede llamar al [phone].  Gracias y tenga cuidado,  Dr. Konrad Penta   Conn's Current Therapy 2021 (pp. 213-216). [place], [place]: Elsevier.">  Enfermedad de reflujo gastroesofgico en los adultos Gastroesophageal Reflux Disease, Adult El reflujo gastroesofgico (RGE) ocurre cuando el cido del estmago sube por el tubo que conecta la boca con el estmago (esfago). Normalmente, la comida baja por el esfago y se mantiene en el estmago, donde se la  digiere. Sin embargo, cuando una persona tiene [place], los alimentos y el cido estomacal suelen volver al esfago. Si esto se vuelve un problema ms grave, a la persona se le puede diagnosticar una enfermedad llamada enfermedad de reflujo gastroesofgico (ERGE). La ERGE ocurre cuando el reflujo:  Sucede a menudo.  Causa sntomas frecuentes o graves.  Causa problemas tales como dao en el esfago. Cuando el cido del Insurance claims handler en contacto con el esfago, el cido puede provocar inflamacin en el esfago. Con el tiempo, pueden formarse pequeos agujeros (lceras) en el revestimiento del esfago. Cules son las causas? Esta afeccin se debe a un problema en el msculo que se encuentra entre el esfago y Product manager (esfnter esofgico inferior, o EEI). Normalmente, el EEI se cierra una vez que la comida pasa a travs del esfago hasta el [place]. Cuando el EEI se encuentra debilitado o tiene alguna anomala, no se cierra por completo, y eso permite que tanto la comida como el jugo gstrico, que es cido, [place] a subir por el esfago. El EEI puede debilitarse a causa de ciertas sustancias alimenticias, medicamentos y Product/process development scientist, que incluyen:  El consumo de [place].  Short.  Tener una hernia de hiato.  Consumo de alcohol.  Ciertos alimentos y bebidas, como caf, chocolate, cebollas y [place]. Qu incrementa el riesgo? Es ms probable que tenga esta afeccin si:  Tiene un aumento del Engineer, site.  Tiene un trastorno del tejido conjuntivo.  Toma antiinflamatorios no esteroideos (AINE), como el ibuprofeno. Cules son los signos  o sntomas? Los sntomas de esta afeccin incluyen:  Merchant navy officer.  Dificultad o dolor para tragar o la sensacin de tener un bulto en la garganta.  Sabor amargo en la boca.  Mal aliento y [place] gran cantidad de saliva.  Estmago inflamado o con Dentist y eructos.  Dolor en el pecho. El dolor de pecho puede deberse a distintas afecciones. Es  importante que consulte al mdico si tiene dolor de [place].  Dificultad para respirar o sibilancias.  Tos constante (crnica) o tos nocturna.  Desgaste del Engineer, structural.  Prdida de peso. Cmo se diagnostica? Esta afeccin se puede diagnosticar en funcin de los antecedentes mdicos y un examen fsico. Para determinar si tiene ERGE leve o grave, el mdico tambin puede controlar cmo usted reacciona al tratamiento. Tambin pueden [employer], que [place] los siguientes:  Un estudio para examinarle el [place] y el esfago con una cmara pequea (endoscopa).  Una prueba para medir el grado de Technical sales engineer.  Una prueba para medir cunta presin hay en el esfago.  Un estudio de deglucin con bario comn o modificado para ver la forma, el tamao y el funcionamiento del esfago. Cmo se trata? El tratamiento de esta afeccin puede variar segn la gravedad de los sntomas. El mdico puede recomendarle lo siguiente:  Cambios en la dieta.  Medicamentos.  [place]. El [place] del tratamiento es ayudar a Paramedic los sntomas y Automotive engineer las complicaciones. Siga estas instrucciones en su casa: Comida y bebida  Siga la dieta recomendada por el mdico. Esto puede incluir evitar ciertos alimentos y bebidas, por ejemplo: ? Caf y t negro, con o sin cafena. ? Bebidas que contengan alcohol. ? Bebidas energticas y deportivas. ? Bebidas gaseosas o refrescos. ? Chocolate y cacao. ? Menta y esencia de [place]. ? Ajo y cebolla. ? Rbano picante. ? Alimentos condimentados, picantes y cidos, por ejemplo, todos los tipos de pimientas, [place] en polvo, curry en polvo, vinagre, salsas picantes y [employer] Petroleum. ? Ctricos y sus jugos, por ejemplo, naranjas, limones y limas. ? Alimentos a base de [address], como salsa de [place], [place], salsa picante y pizza con salsa de [place]. ? Alimentos fritos y [place], [place] donas, papas fritas y aderezos ricos en grasas. ? Carnes con alto contenido  de grasa, como salchichas, y cortes de carnes rojas y blancas con mucha grasa, por ejemplo, chuletas o costillas, embutidos, jamn y tocino. ? Productos lcteos ricos en grasas, como leche [place], [place] y [place] crema.  Haga comidas pequeas y frecuentes Freight forwarder de comidas abundantes.  Evite beber grandes cantidades de lquidos con las comidas.  Evite comer 2 o 3horas antes de acostarse.  Evite recostarse inmediatamente despus de comer.  No haga ejercicios enseguida despus de comer.   Estilo de vida  No consuma ningn producto que contenga nicotina o tabaco. Estos productos incluyen cigarrillos, tabaco para Theatre manager y aparatos de vapeo, como los Administrator, Civil Service. Si necesita ayuda para dejar de fumar, consulte al [employer].  Trate de reducir el estrs con mtodos como el yoga o la meditacin. Si necesita ayuda para reducir [employer] de estrs, consulte al mdico.  Si tiene sobrepeso, baje hasta llegar a un peso saludable para usted. Pdale consejos al mdico para bajar de peso de [place] segura.   Instrucciones generales  Est atento a cualquier cambio en los sntomas.  Use los medicamentos de venta libre y los recetados solamente como se lo haya indicado el mdico. No tome aspirina, ibuprofeno ni otros  antiinflamatorios no esteroideos (AINE) a menos que el mdico le haya indicado que tome estos medicamentos.  Use ropa suelta. No use nada apretado alrededor de la cintura que haga presin sobre el abdomen.  Levante (eleve) la cabecera de la cama aproximadamente 6pulgadas (15cm). Para hacerlo puede usar una cua.  Evite inclinarse si al hacerlo empeoran los sntomas.  Cumpla con todas las visitas de seguimiento. Esto es importante. Comunquese con un mdico si:  Tiene los siguientes sntomas: ? Sntomas nuevos. ? Prdida de peso sin causa aparente. ? Dificultad o dolor al tragar. ? Sibilancias o una tos persistente. ? Voz ronca.  Los sntomas no mejoran  con Dispensing optician. Solicite ayuda de inmediato si:  Tree surgeon repentino [employer], el cuello, la [place], los dientes o la espalda.  De repente se siente transpirado, mareado o aturdido.  Siente falta de aire o Tourist information centre manager.  Vomita y el vmito es de color verde, amarillo o negro, o tiene un aspecto similar a la sangre o a los posos de caf.  Se desmaya.  Tiene heces rojas, sanguinolentas o negras.  No puede tragar, beber o comer. Estos sntomas pueden representar un problema grave que constituye Engineer, maintenance (IT). No espere a ver si los sntomas desaparecen. Solicite atencin mdica de inmediato. Comunquese con el servicio de emergencias de su localidad (911 en los [place]). No conduzca por sus propios medios [employer] Financial. Resumen  El reflujo gastroesofgico ocurre cuando el cido del estmago sube al esfago. La ERGE es una enfermedad en la que el reflujo ocurre con frecuencia, causa sntomas frecuentes o graves, o causa problemas tales como dao en el esfago.  El tratamiento de esta afeccin puede variar segn la gravedad de los sntomas. El mdico puede indicarle que siga una dieta y haga cambios en su estilo de vida, tome medicamentos o se someta a [place].  Comunquese con un mdico si tiene sntomas nuevos o los sntomas empeoran.  Use los medicamentos de venta libre y los recetados solamente como se lo haya indicado el mdico. No tome aspirina, ibuprofeno ni otros antiinflamatorios no esteroideos (AINE) a menos que el mdico se lo indique.  Concurra a todas las visitas de seguimiento como se lo haya indicado el mdico. Esto es importante. Esta informacin no tiene Marine scientist el consejo del mdico. Asegrese de hacerle al mdico cualquier pregunta que tenga. Document Revised: 11/10/2019 Document Reviewed: 11/10/2019 Elsevier Patient Education  2021 [employer].

## 2020-04-19 LAB — BMP8+ANION GAP
Anion Gap: 13 mmol/L (ref 10.0–18.0)
BUN/Creatinine Ratio: 22 — ABNORMAL HIGH (ref 9–20)
BUN: 19 mg/dL (ref 6–24)
CO2: 24 mmol/L (ref 20–29)
Calcium: 9.2 mg/dL (ref 8.7–10.2)
Chloride: 99 mmol/L (ref 96–106)
Creatinine, Ser: 0.88 mg/dL (ref 0.76–1.27)
GFR calc Af Amer: 109 mL/min/{1.73_m2} (ref >59)
GFR calc non Af Amer: 94 mL/min/{1.73_m2} (ref >59)
Glucose: 279 mg/dL — ABNORMAL HIGH (ref 65–99)
Potassium: 4.6 mmol/L (ref 3.5–5.2)
Sodium: 136 mmol/L (ref 134–144)

## 2020-04-19 NOTE — Assessment & Plan Note (Signed)
Patient endorses bitter taste in mouth and nausea and throat discomfort after eating certain foods. Per chart review, has a history of GERD that responded well to PPI in the past.   - Prescribed omeprazole 20mg  daily (OTC)

## 2020-04-19 NOTE — Assessment & Plan Note (Signed)
Patient's RLE wound appears to have healed fully, with residual hyperpigmentation but no open lesion or signs of infection.   - Continue routine foot care

## 2020-04-19 NOTE — Progress Notes (Signed)
   CC: Hyperglycemia   HPI:  Kurt Mathis is a 60 y.o. gentleman w/ PMHx notable for recent admission summer 2021 for RLE cellulitis, NIDDM type II, CAD s/p CABG, HTN and HLD, presenting for diabetes follow up with concerns that his sugars continue to remain high. Please see problem-based assessment / plan for full details.   Past Medical History:  Diagnosis Date  . Cardiomyopathy ([place])   . Coronary artery disease    a. 12/2015: NSTEMI w/ cath showing severe multivessel disease. CABG recommended. b. CABG on 01/01/2016 w/ LIMA-LAD, SVG-RCA-PDA, SVG-D1, and Left Radial-OM1.  . Diabetes mellitus without complication ([place])    a. A1c elevated to 11.7 in 12/2015.  Marland Kitchen Fever 11/06/2019  . HLD (hyperlipidemia)   . NSTEMI (non-ST elevated myocardial infarction) ([place])    Review of Systems:  All others negative except as noted in assessment / plan.   Physical Exam:  Vitals:   04/18/20 0847 04/18/20 0854  BP: (!) 160/69 (!) 146/81  Pulse: 62 68  Temp: 98.2 F (36.8 C)   TempSrc: Oral   SpO2: 99%   Weight: 168 lb 3.2 oz (76.3 kg)   Height: 5\' 10"  (1.778 m)    General: Patient appears well. No acute distress. Eyes: Sclera non-icteric. No conjunctival injection.  Respiratory: Lungs are CTA, bilaterally. No wheezes, rales, or rhonchi.  Cardiovascular: Regular rate and rhythm. No murmurs, rubs, or gallops. No lower extremity edema. Abdominal: Bowel sounds intact. No rebound. Neurological: Alert and oriented x 3 Musculoskeletal: Normal muscle bulk and tone. Skin: Skin overlying right lower leg is hyperpigmented, without erythema, lesion, or drainage. Shiny skin on bilateral lower extremities.  Psych: Normal affect. Normal tone of voice.   Assessment & Plan:   See Encounters Tab for problem based charting.  Patient discussed with Dr. Rebeca Alert.  Jeralyn Bennett, MD 04/19/2020, 9:08 PM Pager: [phone]

## 2020-04-19 NOTE — Assessment & Plan Note (Signed)
Blood pressure remains elevated today.  BP Readings from Last 3 Encounters:  04/18/20 (!) 146/81  03/30/20 (!) 142/71  11/24/19 (!) 143/62  He was started on lisinopril 10mg  last visit in addition to metoprolol 25mg  twice daily. Patient endorses financial strain in affording medications and would benefit from combination pills through IM Pharmacy; however, has concerns regarding distance Kurt Mathis is away from home and says he has to leave urgently for work.  - Encouraged patient to continue lisinopril 10mg  until he is able to make it to [facility] to pick up Lisinopril-HCTZ 10-12.5mg  - Will call patient's preferred [facility] to assess options for combination pills

## 2020-04-19 NOTE — Assessment & Plan Note (Addendum)
Last Hb A1c 8.4% 03/30/20. He was prescribed Jardiance 10mg  daily in addition to Metformin 1g twice daily although states that he was unable to pick up Jardiance as it was too expensive. Is concerned that his sugars remain higher than usual. Endorses nocturia, denies any other symptoms.   - Check BMP  - Prescribed Canagliflozin-Metformin 50-1000mg  twice daily to IM program for cost-effectiveness; however, patient endorses transportation issues - Encouraged continued Metformin 1g bid until patient is able to pick up prescription  - Will look into [facility] options  - Could also consider [place]

## 2020-04-20 ENCOUNTER — Telehealth: Payer: Self-pay | Admitting: Student

## 2020-04-20 NOTE — Progress Notes (Signed)
Internal Medicine Clinic Attending  Case discussed with Dr. Speakman at the time of the visit.  We reviewed the resident's history and exam and pertinent patient test results.  I agree with the assessment, diagnosis, and plan of care documented in the resident's note.  Kenitha Glendinning, M.D., Ph.D.  

## 2020-04-20 NOTE — Telephone Encounter (Signed)
Called patient to inform him of his lab results. Glucose is elevated in the high 200's although BMP is unremarkable. He says he will be able to go to the outpatient pharmacy to pick up his prescriptions by the end of next week. No further questions.   Jeralyn Bennett, PGY1 04/20/2020, 6:33 PM Pager: [phone]

## 2020-05-23 ENCOUNTER — Other Ambulatory Visit (HOSPITAL_COMMUNITY): Payer: Self-pay | Admitting: Student

## 2020-05-23 ENCOUNTER — Ambulatory Visit: Payer: Self-pay | Admitting: Student

## 2020-05-23 ENCOUNTER — Other Ambulatory Visit: Payer: Self-pay

## 2020-05-23 ENCOUNTER — Encounter: Payer: Self-pay | Admitting: Student

## 2020-05-23 DIAGNOSIS — E119 Type 2 diabetes mellitus without complications: Secondary | ICD-10-CM

## 2020-05-23 DIAGNOSIS — I10 Essential (primary) hypertension: Secondary | ICD-10-CM

## 2020-05-23 DIAGNOSIS — R12 Heartburn: Secondary | ICD-10-CM

## 2020-05-23 DIAGNOSIS — K219 Gastro-esophageal reflux disease without esophagitis: Secondary | ICD-10-CM

## 2020-05-23 DIAGNOSIS — I214 Non-ST elevation (NSTEMI) myocardial infarction: Secondary | ICD-10-CM

## 2020-05-23 MED ORDER — METOPROLOL TARTRATE 25 MG PO TABS: ORAL_TABLET | ORAL | 0 refills | Status: DC

## 2020-05-23 MED ORDER — LISINOPRIL-HYDROCHLOROTHIAZIDE 10-12.5 MG PO TABS: 1.0000 | ORAL_TABLET | Freq: Every day | ORAL | 11 refills | Status: DC

## 2020-05-23 MED ORDER — OMEPRAZOLE 20 MG PO CPDR: 20.0000 mg | DELAYED_RELEASE_CAPSULE | Freq: Every day | ORAL | 5 refills | Status: DC

## 2020-05-23 MED ORDER — ATORVASTATIN CALCIUM 40 MG PO TABS: 40.0000 mg | ORAL_TABLET | Freq: Every day | ORAL | 11 refills | Status: DC

## 2020-05-23 MED ORDER — INVOKAMET 50-1000 MG PO TABS: 1.0000 | ORAL_TABLET | Freq: Two times a day (BID) | ORAL | 1 refills | Status: DC

## 2020-05-23 MED ORDER — BLOOD GLUCOSE METER KIT: PACK | 0 refills | Status: AC

## 2020-05-23 MED FILL — LISINOPRIL-HCTZ 10-12.5 MG: 10-12.5 | 30 days supply | Qty: 30 | Fill #0

## 2020-05-23 MED FILL — INVOKAMET 50-1,000 MG TAB: 50-1000 | 30 days supply | Qty: 60 | Fill #0

## 2020-05-23 MED FILL — MICROLET LANCETS MISC: 25 days supply | Qty: 100 | Fill #0

## 2020-05-23 MED FILL — CONTOUR NEXT STRIPS: 25 days supply | Qty: 100 | Fill #0

## 2020-05-23 MED FILL — METOPROLOL TARTRATE 25 MG T: 25 | 30 days supply | Qty: 30 | Fill #0

## 2020-05-23 MED FILL — OMEPRAZOLE DR 20 MG CAPSULE: 20 | 30 days supply | Qty: 30 | Fill #0

## 2020-05-23 MED FILL — CONTOUR NEXT METER: W/DEVICE | 1 days supply | Qty: 1 | Fill #0

## 2020-05-23 MED FILL — ATORVASTATIN 40 MG TABLET: 40 | 30 days supply | Qty: 30 | Fill #0

## 2020-05-23 NOTE — Assessment & Plan Note (Signed)
BP Readings from Last 3 Encounters:  05/23/20 (!) 159/68  04/18/20 (!) 146/81  03/30/20 (!) 142/71   Assessment: Patient on current BP regimen of lisinopril-hctz 10-12.5 daily, metoprolol tartrate 25 mg daily. He continues to have elevated blood pressure during his office visits. I encouraged him to continue his current regimen and if able, to purchase a blood pressure cuff to use at home.   I instructed him to check his blood pressure two times in the morning back to back and two times in the evening back to back while sitting/resting in a chair, two feet on the ground.   I then asked for him to record these and bring them back to the office to assess if his pressures are better at home than in the office.  Plan: -continue lisinopril-hctz 10-12.5 daily and lopressor 25 mg tab daily -consider increasing lisinopril-hctz at next visit if pressure is not improved and if patient brings logs that show hypertension at home.  -follow up in 1-2 months for blood pressure check.

## 2020-05-23 NOTE — Assessment & Plan Note (Signed)
Assessment: A1c last month of 8.4. Patient was started on invokamet 50-1000 BID. He states he is tolerating it well, however, if taken on an empty stomach he becomes nauseated. The patient endorses not eating until 1400 as he has no time prior to working, so he takes his medication on an empty stomach. I discussed with him that metformin can cause nausea and that it may improve with food. He agrees to try to eat breakfast while taking his morning medications to see if improvement occurs. If not, discussed switching him to ER metformin, however, patient is self pay so would need to find ER metformin that he would be able to afford.    Plan: -continue current regimen of invokamet 50-1000mg  BID -repeat A1c in 2 months

## 2020-05-23 NOTE — Patient Instructions (Signed)
Gracias, Kurt Mathis por permitirnos brindarle su atencin hoy. Hoy discutimos.  Diabetes: contine tomando Invokamet, intente tomarlo con alimentos. Si tiene Higher education careers adviser incluso al comer, llame a nuestra oficina.  Presin arterial - Contine tomando todos sus medicamentos para la presin arterial segn lo prescrito!  Debilidad: trate de comer por las maanas antes del [place], creo que esto ayudar con su debilidad.    I have ordered the following labs for you:  Lab Orders  No laboratory test(s) ordered today     Tests ordered today:  none  Referrals ordered today:   Referral Orders  No referral(s) requested today     I have ordered the following medication/changed the following medications:   Stop the following medications: Medications Discontinued During This Encounter  Medication Reason  . blood glucose meter kit and supplies Reorder  . atorvastatin (LIPITOR) 40 MG tablet Reorder  . metoprolol tartrate (LOPRESSOR) 25 MG tablet Reorder  . lisinopril-hydrochlorothiazide (ZESTORETIC) 10-12.5 MG tablet Reorder  . omeprazole (PRILOSEC) 20 MG capsule Reorder  . Canagliflozin-metFORMIN HCl (INVOKAMET) 50-1000 MG TABS Reorder     Start the following medications: Meds ordered this encounter  Medications  . omeprazole (PRILOSEC) 20 MG capsule    Sig: Take 1 capsule (20 mg total) by mouth daily.    Dispense:  30 capsule    Refill:  5    IM Program  . atorvastatin (LIPITOR) 40 MG tablet    Sig: Take 1 tablet (40 mg total) by mouth daily. TAKE 1 TABLET BY MOUTH ONCE DAILY AT  6  PM    Dispense:  30 tablet    Refill:  11    IM program  . blood glucose meter kit and supplies    Sig: Dispense based on patient and insurance preference. Use up to four times daily as directed. (FOR ICD-9 250.00, 250.01).    Dispense:  1 each    Refill:  0    Order Specific Question:   Number of strips    Answer:   120    Order Specific Question:   Number of lancets     Answer:   120  . Canagliflozin-metFORMIN HCl (INVOKAMET) 50-1000 MG TABS    Sig: Take 1 tablet by mouth 2 (two) times daily.    Dispense:  60 tablet    Refill:  1    IM Program  . lisinopril-hydrochlorothiazide (ZESTORETIC) 10-12.5 MG tablet    Sig: Take 1 tablet by mouth daily.    Dispense:  30 tablet    Refill:  11    IM Program  . metoprolol tartrate (LOPRESSOR) 25 MG tablet    Sig: Take 1/2 (one-half) tablet by mouth twice daily    Dispense:  90 tablet    Refill:  0    IM Program     Follow up: 3 months    Remember: controle su presin arterial en casa, dos veces al da. Dos veces por la maana y dos veces por la tarde   Should you have any questions or concerns please call the internal medicine clinic at [phone].     Sanjuana Letters, D.O. [place]

## 2020-05-23 NOTE — Progress Notes (Signed)
   CC: hyperglycemia  HPI:  [name] is a 60 y.o. male with a past medical history stated below and presents today for hyperglycemia. Please see problem based assessment and plan for additional details.  Past Medical History:  Diagnosis Date  . Cardiomyopathy ([place])   . Coronary artery disease    a. 12/2015: NSTEMI w/ cath showing severe multivessel disease. CABG recommended. b. CABG on 01/01/2016 w/ LIMA-LAD, SVG-RCA-PDA, SVG-D1, and Left Radial-OM1.  . Diabetes mellitus without complication ([place])    a. A1c elevated to 11.7 in 12/2015.  Marland Kitchen Fever 11/06/2019  . HLD (hyperlipidemia)   . NSTEMI (non-ST elevated myocardial infarction) [facility])     Current Outpatient Medications on File Prior to Visit  Medication Sig Dispense Refill  . aspirin EC 81 MG tablet Take 1 tablet (81 mg total) by mouth daily. 90 tablet 3  . bacitracin 500 UNIT/GM ointment Apply topically 2 (two) times daily. 15 g 0   No current facility-administered medications on file prior to visit.    Family History  Problem Relation Age of Onset  . Hypertension Mother   . Heart attack Neg Hx     Social History   Socioeconomic History  . Marital status: Single    Spouse name: Not on file  . Number of children: Not on file  . Years of education: Not on file  . Highest education level: Not on file  Occupational History  . Not on file  Tobacco Use  . Smoking status: Never Smoker  . Smokeless tobacco: Never Used  Vaping Use  . Vaping Use: Never used  Substance and Sexual Activity  . Alcohol use: Not Currently  . Drug use: No  . Sexual activity: Not on file  Other Topics Concern  . Not on file  Social History Narrative  . Not on file   Social Determinants of Health   Financial Resource Strain: Not on file  Food Insecurity: Not on file  Transportation Needs: Not on file  Physical Activity: Not on file  Stress: Not on file  Social Connections: Not on file  Intimate Partner Violence: Not on  file    Review of Systems: ROS negative except for what is noted on the assessment and plan.  Vitals:   05/23/20 0847  BP: (!) 159/68  Pulse: 69  Temp: 97.6 F (36.4 C)  TempSrc: Oral  SpO2: 100%  Weight: 164 lb 1.6 oz (74.4 kg)  Height: 5\' 10"  (1.778 m)   Physical Exam: Constitutional: well-appearing. Sitting in chair, resting comfortably. in no acute distress HENT: normocephalic atraumatic Eyes: conjunctiva non-erythematous Neck: supple Cardiovascular: regular rate and rhythm, no m/r/g Pulmonary/Chest: normal work of breathing on room air Abdominal: non-distended MSK: normal bulk and tone Neurological: alert & oriented x 3 Skin: warm and dry Psych: Normal thought process  Assessment & Plan:   See Encounters Tab for problem based charting.  Patient discussed with Dr. Fanny Bien, D.O. [facility], PGY-1 Pager: [phone], Phone: [phone] Date 05/23/2020 Time 11:07 AM

## 2020-05-23 NOTE — Assessment & Plan Note (Signed)
Assessment: Pt endorses improvement of GERD symptoms since starting omeprazole 20 mg last visit. Pt asked if he should continue taking since he is no longer having symptoms, I recommended he continue this medication as it continuing to help.   Plan: - continue omeprazole 20 mg daily

## 2020-05-31 NOTE — Progress Notes (Signed)
Internal Medicine Clinic Attending  Case discussed with Dr. Katsadouros  At the time of the visit.  We reviewed the resident's history and exam and pertinent patient test results.  I agree with the assessment, diagnosis, and plan of care documented in the resident's note.  

## 2020-07-20 ENCOUNTER — Other Ambulatory Visit: Payer: Self-pay

## 2020-07-20 ENCOUNTER — Other Ambulatory Visit (HOSPITAL_COMMUNITY): Payer: Self-pay

## 2020-07-20 ENCOUNTER — Encounter: Payer: Self-pay | Admitting: Student

## 2020-07-20 ENCOUNTER — Ambulatory Visit: Payer: Self-pay | Admitting: Student

## 2020-07-20 VITALS — BP 126/64 | HR 58 | Temp 97.7°F | Ht 70.0 in | Wt 164.0 lb

## 2020-07-20 DIAGNOSIS — I10 Essential (primary) hypertension: Secondary | ICD-10-CM

## 2020-07-20 DIAGNOSIS — G629 Polyneuropathy, unspecified: Secondary | ICD-10-CM

## 2020-07-20 DIAGNOSIS — E119 Type 2 diabetes mellitus without complications: Secondary | ICD-10-CM

## 2020-07-20 DIAGNOSIS — K219 Gastro-esophageal reflux disease without esophagitis: Secondary | ICD-10-CM

## 2020-07-20 DIAGNOSIS — E785 Hyperlipidemia, unspecified: Secondary | ICD-10-CM

## 2020-07-20 DIAGNOSIS — I214 Non-ST elevation (NSTEMI) myocardial infarction: Secondary | ICD-10-CM

## 2020-07-20 LAB — GLUCOSE, CAPILLARY: Glucose-Capillary: 188 mg/dL — ABNORMAL HIGH (ref 70–99)

## 2020-07-20 LAB — POCT GLYCOSYLATED HEMOGLOBIN (HGB A1C): Hemoglobin A1C: 8.3 % — AB (ref 4.0–5.6)

## 2020-07-20 MED ORDER — MICROLET LANCETS MISC
3 refills | Status: AC
  Filled 2020-07-20: qty 100, 25d supply, fill #0

## 2020-07-20 MED ORDER — METOPROLOL TARTRATE 25 MG PO TABS
12.5000 mg | ORAL_TABLET | Freq: Two times a day (BID) | ORAL | 1 refills | Status: DC
  Filled 2020-07-20: qty 30, 30d supply, fill #0

## 2020-07-20 MED ORDER — ASPIRIN EC 81 MG PO TBEC
81.0000 mg | DELAYED_RELEASE_TABLET | Freq: Every day | ORAL | 3 refills | Status: AC
  Filled 2020-07-20: qty 90, 90d supply, fill #0

## 2020-07-20 MED ORDER — CANAGLIFLOZIN-METFORMIN HCL 50-1000 MG PO TABS
1.0000 | ORAL_TABLET | Freq: Two times a day (BID) | ORAL | 2 refills | Status: DC
  Filled 2020-07-20: qty 60, 30d supply, fill #0

## 2020-07-20 MED ORDER — GLUCOSE BLOOD VI STRP
ORAL_STRIP | 3 refills | Status: AC
  Filled 2020-07-20: qty 100, 25d supply, fill #0

## 2020-07-20 NOTE — Patient Instructions (Signed)
[name],   As we discussed, your blood pressure improved while you were resting in the office; however, it would be great if you get the chance to purchase a blood pressure cuff to monitor your pressures while sitting at rest at home and record these a few times per week to be sure we do not need to adjust your medications.   Juice (even if no sugar added) may increase your blood sugars. Try to drink "diet" juice to see if this helps lower your sugars. I will send in a referral to see Butch Penny, our diabetic educator, and she can reach out by phone to discuss additional options. You may benefit from the addition of another oral pill vs. Injection depending on your results, but we will hold off until you meet with Butch Penny to discuss this.   Please take your remaining omeprazole every other day and then you may discontinue this.  I will call you with your result as soon as I receive it and refill your prescriptions as needed.   I will inform you when to schedule follow up upon receiving your results and we will get you an appointment with our financial advisor.   Great seeing you and take care,  Dr. Konrad Penta   ---  Sr. Torres-Castillo,  [place] comentamos, su presin arterial mejor mientras descansaba en la oficina; sin embargo, sera genial si tuviera la oportunidad de comprar un manguito de presin arterial para controlar sus presiones mientras descansa en su casa y registrarlas varias veces por semana para asegurarse de que no necesitemos ajustar sus medicamentos.  El jugo (incluso si no tiene Holiday representative) puede aumentar sus niveles de Dispensing optician. Trate de beber jugo "diettico" para ver si esto ayuda a reducir sus niveles de azcar. Loleta Dicker referencia para ver a Sharen Counter educadora sobre diabetes, y ella puede comunicarse por telfono para Tourist information centre manager. Es posible que se beneficie de la adicin de otra pldora oral en lugar de una inyeccin, segn  sus [place], pero esperaremos [place] se rena con Butch Penny para hablar sobre esto.  Tome el omeprazol restante [place] y luego puede suspenderlo.  Lo llamar con su resultado tan pronto como lo reciba y Location manager a surtir sus recetas segn sea necesario.  Le informar cundo Engineer, production seguimiento al recibir [employer] y le conseguiremos una cita con nuestro asesor financiero.  Encantado de verte y cuidarte,  Dr. Tamala Ser and Daroff's neurology in clinical practice (8th ed., pp. [phone]). Elsevier."> Goldman-Cecil medicine (26th ed., pp. [phone]- [phone]). Elsevier.">  Neuropata perifrica Peripheral Neuropathy La neuropata perifrica es un tipo de lesin nerviosa. Afecta los nervios que transportan seales entre la mdula espinal y [place], las piernas y el resto del cuerpo (nervios perifricos). No afecta los nervios de la mdula espinal ni el cerebro. En los casos de neuropata perifrica, es posible que se dae un nervio o un grupo de nervios. La neuropata perifrica es una categora amplia que incluye muchos trastornos nerviosos especficos, como la neuropata diabtica, la neuropata hereditaria y el sndrome del tnel carpiano. Cules son las causas? Esta afeccin puede ser causada por lo siguiente:  Diabetes. Esta es la causa ms comn de la neuropata perifrica.  Lesiones en los nervios.  Presin o tensin duraderas en un nervio.  Falta (deficiencia) de vitaminasB. Puede ser a causa del alcoholismo, una mala alimentacin o una restriccin en la dieta.  Infecciones.  Enfermedades autoinmunitarias, como artritis reumatoide y  lupus eritematoso sistmico.  Enfermedades de los nervios que se transmiten de padres a hijos (hereditarias).  Algunos medicamentos, por ejemplo, medicamentos contra el cncer (quimioterapia).  Sustancias venenosas (txicas), como el plomo y Interior and spatial designer.  Flujo deficiente de [employer].  Enfermedad  renal.  Enfermedad tiroidea. En algunos casos, se desconoce la causa de este trastorno. Cules son los signos o sntomas? Los sntomas de esta afeccin dependern de cules nervios se hayan daado. Los sntomas frecuentes incluyen los siguientes:  Prdida de la sensibilidad (adormecimiento) en los pies, las [place].  Hormigueo en los pies, las manos o ambos.  Dolor urente.  Piel muy sensible.  Debilidad.  Imposibilidad para mover partes del cuerpo (parlisis).  Fasciculaciones (temblores musculares).  Torpeza o coordinacin deficiente.  Prdida del equilibrio.  Dificultad para controlar la vejiga.  Mareos.  Problemas sexuales. Cmo se diagnostica? Puede resultar difcil diagnosticar la neuropata perifrica y Designer, jewellery su causa. El mdico revisar sus antecedentes mdicos y le har un examen fsico. Tambin se realizar un examen neurolgico. Esto involucra controlar aquello que se vea afectado por el cerebro, la mdula espinal y los nervios (sistema nervioso). Por ejemplo, el mdico controlar sus reflejos, cmo se mueve y su grado de sensibilidad. Tambin pueden hacerle otros estudios, por ejemplo:  Anlisis de [place].  Electromiografa(EMG) y estudios de conduccin nerviosa. Estos estudios controlan la actividad nerviosa y la eficacia con la que los nervios controlan los msculos.  Estudios de diagnstico por imgenes, como una exploracin por tomografa computarizada (TC) o una resonancia magntica (RM), para descartar otras causas de los sntomas.  Extraccin de una pequea porcin de nervio para examinarla en un laboratorio (biopsia del nervio).  Extraccin y evaluacin de una pequea cantidad del lquido que rodea el cerebro y la mdula espinal (puncin lumbar). Cmo se trata? El tratamiento de esta afeccin puede incluir lo siguiente:  Tratamiento de la causa preexistente de la neuropata, como la diabetes, una enfermedad renal o la deficiencia de  vitaminas.  Interrupcin de los [employer] pueden causar la neuropata, como la quimioterapia.  Medicamentos para Best boy. Los medicamentos pueden [employer] siguientes: ? Analgsicos de [place]. ? Medicamentos anticonvulsivos. ? Antidepresivos. ? Parches analgsicos que se colocan en las zonas de la piel que le duelen.  [place] para Human resources officer presin en un nervio o para eliminar un nervio que est provocando dolor.  Fisioterapia para ayudar a Community education officer y el equilibrio.  Dispositivos para ayudarlo a movilizarse (dispositivos de [place]). Siga estas instrucciones en su casa: Medicamentos  Use los medicamentos de venta libre y los recetados solamente como se lo haya indicado el mdico. No tome otros medicamentos sin Teacher, adult education con su mdico primero.  No conduzca ni use maquinaria pesada mientras toma analgsicos recetados. Estilo de vida  No consuma ningn producto que contenga nicotina o tabaco, como cigarrillos y Psychologist, sport and exercise. Fumar evita que la sangre llegue a los nervios daados. Si necesita ayuda para dejar de consumir estos productos, consulte al mdico.  Evite o limite el consumo alcohol. El exceso de alcohol puede causar una deficiencia de vitaminaB, y esta es necesaria para conservar el buen [place] de los nervios.  Siga una dieta saludable. Esto incluye: ? Consumir alimentos ricos en fibra, como frutas y verduras frescas, cereales integrales y frijoles. ? Limitar el consumo de alimentos ricos en grasas y azcares procesados, como los alimentos fritos o dulces.   Indicaciones generales  Si tiene diabetes, trabaje estrechamente con el  mdico para mantener bajo control el nivel de Dispensing optician.  Si siente adormecimiento en los pies, haga lo siguiente: ? Realice controles todos los das para detectar signos de lesin o infeccin. Observe seales de enrojecimiento, calor e hinchazn. ? Use calcetines acolchados y  zapatos cmodos. Estos ayudan a [employer].  Desarrolle un buen sistema de apoyo. Vivir con neuropata perifrica puede ser estresante. Considere la posibilidad de Electrical engineer con un especialista en salud mental o de unirse a un grupo de [place].  Use los dispositivos de [place] y asista a fisioterapia como se lo haya indicado el mdico. Esto podra incluir el uso de un andador o un bastn.  Concurra a todas las visitas de seguimiento como se lo haya indicado el mdico. Esto es importante.   Comunquese con un mdico si:  Advierte signos o sntomas nuevos de la neuropata perifrica.  Le est costando lidiar con la neuropata perifrica desde el punto de vista emocional.  El dolor no est bajo control. Solicite ayuda de inmediato si:  Tiene una lesin o una infeccin que no se cura con normalidad.  Comienza a tener debilidad en un brazo o una pierna.  Se ha cado o lo hace con frecuencia. Resumen  La neuropata perifrica es una afeccin en la que los nervios de los brazos o las piernas se daan, lo que provoca adormecimiento, debilidad o Social research officer, government.  Existen muchas causas de la neuropata perifrica; entre ellas, diabetes, compresin de algn nervio, deficiencia de vitaminas, enfermedad autoinmunitaria y afecciones hereditarias.  Puede resultar difcil diagnosticar la neuropata perifrica y Designer, jewellery su causa. El mdico revisar sus antecedentes mdicos y Teacher, early years/pre un examen fsico y algunos estudios, como anlisis de [place] y estudios de la [place].  El tratamiento implica tratar la causa preexistente de la neuropata y usar medicamentos para Financial controller. La fisioterapia y los dispositivos de ayuda tambin podran ser tiles. Esta informacin no tiene Marine scientist el consejo del mdico. Asegrese de hacerle al mdico cualquier pregunta que tenga. Document Revised: 01/14/2020 Document Reviewed: 01/14/2020 Elsevier Patient Education  2021 [employer].

## 2020-07-20 NOTE — Progress Notes (Signed)
   CC: Hyperglycemia   HPI:  [name] is a 60 y.o. gentleman w/ PMHx NIDDM type II, HTN, dyslipidemia, s/p CABG in 2017, and GERD, presenting for a routine follow up visit. He notes his sugars have been more elevated over the past couple of weeks, up to the 180-190's in the mornings, previously in the 130's-140's despite trying to decrease sugars in his diet. He notes he does drink juice with "no sugar added". He does endorse bilateral tingling pain in his bilateral lower extremities but denies numbness, weakness or other symptoms including CP, SOB, polydipsia, polyuria, nausea, abdominal pain, or any other concerns.   Past Medical History:  Diagnosis Date  . Cardiomyopathy ([place])   . Coronary artery disease    a. 12/2015: NSTEMI w/ cath showing severe multivessel disease. CABG recommended. b. CABG on 01/01/2016 w/ LIMA-LAD, SVG-RCA-PDA, SVG-D1, and Left Radial-OM1.  . Diabetes mellitus without complication ([place])    a. A1c elevated to 11.7 in 12/2015.  Marland Kitchen Fever 11/06/2019  . HLD (hyperlipidemia)   . NSTEMI (non-ST elevated myocardial infarction) ([place])    Review of Systems:  All others negative except as noted in assessment / plan.   Physical Exam:  Vitals:   07/20/20 0854 07/20/20 0856  BP: (!) 156/67 126/64  Pulse: 62 (!) 58  Temp: 97.7 F (36.5 C)   TempSrc: Oral   SpO2: 98%   Weight: 164 lb (74.4 kg)   Height: 5\' 10"  (1.778 m)    General: Patient appears physically fit. Appears well, in no acute distress. Eyes: Sclera non-icteric. No conjunctival injection.  HENT: MMM. No nasal discharge. Respiratory: Lungs are CTA, bilaterally. No wheezes, rales, or rhonchi.  Cardiovascular: Rate is slightly bradycardic. Rhythm is regular. No murmurs, rubs, or gallops. No lower extremity edema. Neurological: There is dysesthesia of the bilateral lower extremities below the knees. Alert and oriented x 3.  Abdominal: Soft and non-tender to palpation. Bowel sounds intact. No  rebound or guarding. Skin: No lesions. No rashes.  Psych: Normal affect. Normal tone of voice.   Assessment & Plan:   See Encounters Tab for problem based charting.  Patient discussed with Dr. Dareen Piano.  Jeralyn Bennett, MD 07/20/2020, 9:38 AM Pager: [phone]

## 2020-07-21 ENCOUNTER — Telehealth: Payer: Self-pay | Admitting: Student

## 2020-07-21 NOTE — Assessment & Plan Note (Addendum)
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 He takes his sugars regularly at home which have gone up to 190's fasting in the mornings. A1c this visit stable at 8.3; however, patient understands clearly that this is above his goal and is very motivated to make necessary changes.   - Advised him to drink "diet" juices only and continue daily exercise as able  - Referred to [facility] for DM education (notes he is required to leave by 9:30am for work)  - Continue canagliflozen-metformin 50-1000mg  BID  - Plan to start weekly GLP-1 agonist after discussion with Butch Penny

## 2020-07-21 NOTE — Assessment & Plan Note (Signed)
Heartburn symptoms resolved with omeprazole 20mg  daily. He states he has about 10 pills left and would like to discontinue this as he is wants to lower his pill burden.   - Gradually taper off omeprazole  - Informed him to call [facility] if symptoms return

## 2020-07-21 NOTE — Assessment & Plan Note (Signed)
Last cholesterol panel was in 2017. Patient will require repeat follow up lipid profile at next visit.   - Continue atorvastatin 40mg  daily for now

## 2020-07-21 NOTE — Telephone Encounter (Signed)
Called to inform patient his A1c remains persistently elevated although slightly improved from last visit from 8.4 to 8.3%. He is very concerned regarding his elevated A1c and eager to bring this down. Discussed I would speak with Butch Penny to try to get him a more urgent appointment to try to get him on a GLP-1 given extensive cardiac disease; however, he does note significant limitations due to inability to miss work and is only available until ~9:30am in the morning, relying on outside transportation.   Will work as best we can to schedule appointment with Butch Penny as soon as possible.   Jeralyn Bennett, MD 07/21/2020, 5:32 PM Pager: [phone]

## 2020-07-21 NOTE — Assessment & Plan Note (Addendum)
Blood pressure decreased on repeat assessment. BP Readings from Last 3 Encounters:  07/20/20 126/64  05/23/20 (!) 159/68  04/18/20 (!) 146/81   - Continue monitoring on Zestoretic 10-12.5mg  daily  - Continue Lopressor 12.5mg  BID although monitor for worsening bradycardia at next visit

## 2020-07-21 NOTE — Assessment & Plan Note (Addendum)
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   - Patient is not interested in starting additional medication for this currently  - Continue to monitor for progression

## 2020-07-24 ENCOUNTER — Encounter: Payer: Self-pay | Admitting: Student

## 2020-07-24 NOTE — Progress Notes (Signed)
Internal Medicine Clinic Attending  Case discussed with Dr. Speakman  At the time of the visit.  We reviewed the resident's history and exam and pertinent patient test results.  I agree with the assessment, diagnosis, and plan of care documented in the resident's note.  

## 2020-07-26 ENCOUNTER — Other Ambulatory Visit: Payer: Self-pay | Admitting: *Deleted

## 2020-07-26 DIAGNOSIS — I214 Non-ST elevation (NSTEMI) myocardial infarction: Secondary | ICD-10-CM

## 2020-07-26 DIAGNOSIS — I10 Essential (primary) hypertension: Secondary | ICD-10-CM

## 2020-07-26 DIAGNOSIS — E119 Type 2 diabetes mellitus without complications: Secondary | ICD-10-CM

## 2020-07-26 MED ORDER — LISINOPRIL-HYDROCHLOROTHIAZIDE 10-12.5 MG PO TABS: 1.0000 | ORAL_TABLET | Freq: Every day | ORAL | 3 refills | Status: DC

## 2020-07-26 MED ORDER — METOPROLOL TARTRATE 25 MG PO TABS: 12.5000 mg | ORAL_TABLET | Freq: Two times a day (BID) | ORAL | 1 refills | Status: DC

## 2020-07-26 MED ORDER — ATORVASTATIN CALCIUM 40 MG PO TABS: ORAL_TABLET | ORAL | 3 refills | Status: DC

## 2020-07-26 MED ORDER — CANAGLIFLOZIN-METFORMIN HCL 50-1000 MG PO TABS: 1.0000 | ORAL_TABLET | Freq: Two times a day (BID) | ORAL | 2 refills | Status: DC

## 2020-07-26 NOTE — Telephone Encounter (Signed)
Pt called and stated he wants his med refills to go to [facility] on [facility] which is closer to where he lives. And stated the last time [place] took too long. Stated he only has 3 days left of medications. Requesting 90 days supply.

## 2020-07-28 ENCOUNTER — Other Ambulatory Visit (HOSPITAL_COMMUNITY): Payer: Self-pay

## 2021-04-29 ENCOUNTER — Other Ambulatory Visit: Payer: Self-pay | Admitting: Internal Medicine

## 2021-04-29 DIAGNOSIS — I214 Non-ST elevation (NSTEMI) myocardial infarction: Secondary | ICD-10-CM

## 2021-05-03 ENCOUNTER — Other Ambulatory Visit: Payer: Self-pay

## 2021-05-03 DIAGNOSIS — E119 Type 2 diabetes mellitus without complications: Secondary | ICD-10-CM

## 2021-05-03 NOTE — Telephone Encounter (Signed)
Canagliflozin-metFORMIN HCl 50-1000 MG TABS, REFILL REQUEST @ [place], [place] - [address]

## 2021-05-07 ENCOUNTER — Other Ambulatory Visit: Payer: Self-pay

## 2021-05-07 DIAGNOSIS — E119 Type 2 diabetes mellitus without complications: Secondary | ICD-10-CM

## 2021-05-07 MED ORDER — CANAGLIFLOZIN-METFORMIN HCL 50-1000 MG PO TABS: 1.0000 | ORAL_TABLET | Freq: Two times a day (BID) | ORAL | 2 refills | Status: DC

## 2021-05-28 ENCOUNTER — Other Ambulatory Visit: Payer: Self-pay

## 2021-05-28 ENCOUNTER — Ambulatory Visit (INDEPENDENT_AMBULATORY_CARE_PROVIDER_SITE_OTHER): Payer: Self-pay | Admitting: Student

## 2021-05-28 ENCOUNTER — Encounter: Payer: Self-pay | Admitting: Student

## 2021-05-28 ENCOUNTER — Other Ambulatory Visit (HOSPITAL_COMMUNITY): Payer: Self-pay

## 2021-05-28 VITALS — BP 152/80 | HR 61 | Temp 98.5°F | Ht 70.0 in | Wt 172.2 lb

## 2021-05-28 DIAGNOSIS — Z0000 Encounter for general adult medical examination without abnormal findings: Secondary | ICD-10-CM

## 2021-05-28 DIAGNOSIS — I10 Essential (primary) hypertension: Secondary | ICD-10-CM

## 2021-05-28 DIAGNOSIS — E119 Type 2 diabetes mellitus without complications: Secondary | ICD-10-CM

## 2021-05-28 DIAGNOSIS — Z23 Encounter for immunization: Secondary | ICD-10-CM

## 2021-05-28 LAB — POCT GLYCOSYLATED HEMOGLOBIN (HGB A1C): Hemoglobin A1C: 10.5 % — AB (ref 4.0–5.6)

## 2021-05-28 LAB — GLUCOSE, CAPILLARY: Glucose-Capillary: 234 mg/dL — ABNORMAL HIGH (ref 70–99)

## 2021-05-28 MED ORDER — DAPAGLIFLOZIN PROPANEDIOL 5 MG PO TABS
5.0000 mg | ORAL_TABLET | Freq: Every day | ORAL | 11 refills | Status: DC
  Filled 2021-05-28: qty 30, 30d supply, fill #0
  Filled 2021-07-02: qty 30, 30d supply, fill #1

## 2021-05-28 MED ORDER — METFORMIN HCL 1000 MG PO TABS: 1000.0000 mg | ORAL_TABLET | Freq: Two times a day (BID) | ORAL | 3 refills | Status: DC

## 2021-05-28 MED ORDER — LISINOPRIL-HYDROCHLOROTHIAZIDE 20-12.5 MG PO TABS: 1.0000 | ORAL_TABLET | Freq: Every day | ORAL | 3 refills | Status: DC

## 2021-05-28 NOTE — Assessment & Plan Note (Signed)
Patient with history of uncontrolled type 2 diabetes, on metformin 1000 mg twice daily.  He states that he was started on another medication at last visit but it was too expensive so he was not able to pick it up.  States that in the meantime he has been taking metformin 1000 mg twice daily but has ran out of it for several weeks.  He is here today for follow-up of his type 2 diabetes and for medication refills.  His last A1c was 8.3% in April 2023.  Repeat A1c today of 10.5%.  He was unable to bring his home meter with him today, but states that his home readings have remained in the 200s.  Discussed at length importance of remaining adherent to metformin for more optimal glycemic control.  Also discussed addition of dapagliflozin for better glycemic control along with cardioprotective and renal protective effects.  He agrees with this.  Patient is uninsured so will prescribe through $4 list at [facility] outpatient pharmacy.  Lastly, also discussed importance of diet and exercise.  Plan: -Continue metformin 1000 mg twice daily -Start dapagliflozin 5 mg daily, titrate up as needed -Follow-up in 3 months for repeat A1c

## 2021-05-28 NOTE — Patient Instructions (Signed)
Sr. Marketing executive,  Fue un placer verte hoy en la clnica.  1. Hoy te vacunaste contra la gripe. 2. Estamos revisando su nivel de azcar en la sangre de 3 meses hoy. 3. He recargado tu metformina ([facility]) 4. Le comenc a tomar un nuevo medicamento para su diabetes. Se llama dapagliflozina (farxiga). Recoja esto en la farmacia para pacientes ambulatorios de [place]. [place]. 5. Recoja su medicamento para la presin arterial (lisinopril-HCTZ) en la farmacia de [facility].  Llame a nuestra clnica al [phone] si tiene alguna pregunta o inquietud. El mejor horario para llamar es de lunes a viernes de 9 a. m. a 4 p. m., pero hay alguien disponible las 24 horas del da, los 7 das de la semana en el mismo nmero. Si necesita reposicin de medicamentos, por favor notifique a su farmacia con una semana de anticipacin y ellos nos enviarn una solicitud.  Gracias por dejarnos participar en su cuidado. Esperamos verte la prxima vez!

## 2021-05-28 NOTE — Assessment & Plan Note (Signed)
Received his flu shot today.

## 2021-05-28 NOTE — Assessment & Plan Note (Addendum)
Vitals:   05/28/21 0832 05/28/21 0932  BP: (!) 148/71 (!) 152/80   Patient with history of hypertension, on lisinopril-HCTZ 10-12.5 mg daily and Lopressor 25 mg daily.  Blood pressure is elevated today and states that it has been elevated on home readings as well to the 027O systolics.  Discussed increasing dose of lisinopril to help better control blood pressure.  He is agreeable with this.  We will check kidney function and electrolytes given long-term lisinopril use and increasing dose of lisinopril today.  Plan: -Continue Lopressor 25 mg daily -Increase to lisinopril-HCTZ 20-12.5 mg daily -Follow-up BMP -Follow-up in 3 months

## 2021-05-28 NOTE — Progress Notes (Signed)
° °  CC: Follow-up type 2 diabetes  HPI:  Mr.Winfield Torres-Castillo is a 61 y.o. male with history listed below presenting to the [facility] for follow-up type 2 diabetes. Please see individualized problem based charting for full HPI.  Past Medical History:  Diagnosis Date   Cardiomyopathy [facility])    Coronary artery disease    a. 12/2015: NSTEMI w/ cath showing severe multivessel disease. CABG recommended. b. CABG on 01/01/2016 w/ LIMA-LAD, SVG-RCA-PDA, SVG-D1, and Left Radial-OM1.   Diabetes mellitus without complication ([place])    a. A1c elevated to 11.7 in 12/2015.   Fever 11/06/2019   HLD (hyperlipidemia)    NSTEMI (non-ST elevated myocardial infarction) ([place])     Review of Systems:  Negative aside from that listed in individualized problem based charting.  Physical Exam:  Vitals:   05/28/21 0832  BP: (!) 148/71  Pulse: 61  Temp: 98.5 F (36.9 C)  TempSrc: Oral  SpO2: 100%  Weight: 172 lb 3.2 oz (78.1 kg)  Height: 5\' 10"  (1.778 m)   Physical Exam Constitutional:      Appearance: Normal appearance.  HENT:     Mouth/Throat:     Mouth: Mucous membranes are moist.     Pharynx: Oropharynx is clear. No oropharyngeal exudate.  Eyes:     Extraocular Movements: Extraocular movements intact.     Conjunctiva/sclera: Conjunctivae normal.     Pupils: Pupils are equal, round, and reactive to light.  Cardiovascular:     Rate and Rhythm: Normal rate and regular rhythm.     Pulses: Normal pulses.  Pulmonary:     Effort: Pulmonary effort is normal.     Breath sounds: Normal breath sounds. No wheezing, rhonchi or rales.  Abdominal:     General: Bowel sounds are normal. There is no distension.     Palpations: Abdomen is soft.     Tenderness: There is no abdominal tenderness.  Musculoskeletal:        General: No swelling. Normal range of motion.  Skin:    General: Skin is warm and dry.  Neurological:     General: No focal deficit present.     Mental Status: He is alert and oriented to  person, place, and time.  Psychiatric:        Mood and Affect: Mood normal.        Behavior: Behavior normal.     Assessment & Plan:   See Encounters Tab for problem based charting.  Patient discussed with Dr. Dareen Piano

## 2021-05-29 ENCOUNTER — Other Ambulatory Visit (HOSPITAL_COMMUNITY): Payer: Self-pay

## 2021-05-29 LAB — BMP8+ANION GAP
Anion Gap: 15 mmol/L (ref 10.0–18.0)
BUN/Creatinine Ratio: 23 (ref 10–24)
BUN: 23 mg/dL (ref 8–27)
CO2: 25 mmol/L (ref 20–29)
Calcium: 9.5 mg/dL (ref 8.6–10.2)
Chloride: 99 mmol/L (ref 96–106)
Creatinine, Ser: 1 mg/dL (ref 0.76–1.27)
Glucose: 264 mg/dL — ABNORMAL HIGH (ref 70–99)
Potassium: 5 mmol/L (ref 3.5–5.2)
Sodium: 139 mmol/L (ref 134–144)
eGFR: 86 mL/min/{1.73_m2} (ref >59)

## 2021-05-30 NOTE — Progress Notes (Signed)
Internal Medicine Clinic Attending  Case discussed with Dr. Jinwala  At the time of the visit.  We reviewed the resident's history and exam and pertinent patient test results.  I agree with the assessment, diagnosis, and plan of care documented in the resident's note.  

## 2021-06-30 ENCOUNTER — Other Ambulatory Visit: Payer: Self-pay | Admitting: Internal Medicine

## 2021-06-30 DIAGNOSIS — I214 Non-ST elevation (NSTEMI) myocardial infarction: Secondary | ICD-10-CM

## 2021-06-30 DIAGNOSIS — I10 Essential (primary) hypertension: Secondary | ICD-10-CM

## 2021-06-30 DIAGNOSIS — E119 Type 2 diabetes mellitus without complications: Secondary | ICD-10-CM

## 2021-07-02 ENCOUNTER — Other Ambulatory Visit (HOSPITAL_COMMUNITY): Payer: Self-pay

## 2021-07-03 MED ORDER — METFORMIN HCL 1000 MG PO TABS: 1000.0000 mg | ORAL_TABLET | Freq: Two times a day (BID) | ORAL | 3 refills | Status: DC

## 2021-07-03 MED ORDER — METOPROLOL TARTRATE 25 MG PO TABS: ORAL_TABLET | ORAL | 0 refills | Status: DC

## 2021-07-03 MED ORDER — LISINOPRIL-HYDROCHLOROTHIAZIDE 20-12.5 MG PO TABS: 1.0000 | ORAL_TABLET | Freq: Every day | ORAL | 3 refills | Status: DC

## 2021-07-10 ENCOUNTER — Other Ambulatory Visit (HOSPITAL_COMMUNITY): Payer: Self-pay

## 2021-10-15 ENCOUNTER — Other Ambulatory Visit: Payer: Self-pay | Admitting: Internal Medicine

## 2021-10-15 DIAGNOSIS — I214 Non-ST elevation (NSTEMI) myocardial infarction: Secondary | ICD-10-CM

## 2021-10-16 NOTE — Telephone Encounter (Signed)
Needs follow up appointment scheduled.

## 2021-10-24 ENCOUNTER — Ambulatory Visit (INDEPENDENT_AMBULATORY_CARE_PROVIDER_SITE_OTHER): Payer: Self-pay | Admitting: Student

## 2021-10-24 ENCOUNTER — Other Ambulatory Visit (HOSPITAL_COMMUNITY): Payer: Self-pay

## 2021-10-24 ENCOUNTER — Encounter: Payer: Self-pay | Admitting: Student

## 2021-10-24 VITALS — BP 137/63 | HR 74 | Temp 97.9°F | Wt 166.2 lb

## 2021-10-24 DIAGNOSIS — E785 Hyperlipidemia, unspecified: Secondary | ICD-10-CM

## 2021-10-24 DIAGNOSIS — I214 Non-ST elevation (NSTEMI) myocardial infarction: Secondary | ICD-10-CM

## 2021-10-24 DIAGNOSIS — E119 Type 2 diabetes mellitus without complications: Secondary | ICD-10-CM

## 2021-10-24 DIAGNOSIS — I10 Essential (primary) hypertension: Secondary | ICD-10-CM

## 2021-10-24 DIAGNOSIS — Z7984 Long term (current) use of oral hypoglycemic drugs: Secondary | ICD-10-CM

## 2021-10-24 LAB — POCT GLYCOSYLATED HEMOGLOBIN (HGB A1C): Hemoglobin A1C: 11 % — AB (ref 4.0–5.6)

## 2021-10-24 LAB — GLUCOSE, CAPILLARY: Glucose-Capillary: 264 mg/dL — ABNORMAL HIGH (ref 70–99)

## 2021-10-24 MED ORDER — METFORMIN HCL 1000 MG PO TABS: 1000.0000 mg | ORAL_TABLET | Freq: Two times a day (BID) | ORAL | 3 refills | Status: DC

## 2021-10-24 MED ORDER — DAPAGLIFLOZIN PROPANEDIOL 5 MG PO TABS
10.0000 mg | ORAL_TABLET | Freq: Every day | ORAL | 11 refills | Status: DC
  Filled 2021-10-24: qty 30, 15d supply, fill #0

## 2021-10-24 MED ORDER — ATORVASTATIN CALCIUM 40 MG PO TABS: ORAL_TABLET | ORAL | 0 refills | Status: DC

## 2021-10-24 MED ORDER — LISINOPRIL-HYDROCHLOROTHIAZIDE 20-12.5 MG PO TABS: 1.0000 | ORAL_TABLET | Freq: Every day | ORAL | 3 refills | Status: DC

## 2021-10-24 MED ORDER — DAPAGLIFLOZIN PROPANEDIOL 10 MG PO TABS
10.0000 mg | ORAL_TABLET | Freq: Every day | ORAL | 11 refills | Status: AC
  Filled 2021-10-24: qty 30, 30d supply, fill #0
  Filled 2021-11-30: qty 30, 30d supply, fill #1
  Filled 2021-12-31: qty 30, 30d supply, fill #2
  Filled 2022-01-28: qty 30, 30d supply, fill #3
  Filled 2022-03-04: qty 30, 30d supply, fill #4
  Filled 2022-04-03: qty 30, 30d supply, fill #5
  Filled 2022-05-06: qty 30, 30d supply, fill #6
  Filled 2022-06-04: qty 30, 30d supply, fill #7
  Filled 2022-07-02: qty 30, 30d supply, fill #8
  Filled 2022-08-05: qty 30, 30d supply, fill #9
  Filled 2022-10-15: qty 30, 30d supply, fill #10

## 2021-10-24 MED ORDER — METOPROLOL TARTRATE 25 MG PO TABS: ORAL_TABLET | ORAL | 0 refills | Status: DC

## 2021-10-24 NOTE — Patient Instructions (Signed)
Thank you so much for coming into the clinic today. I refilled your metoprolol, atorvastatin, and metformin.  We will prescribe Wilder Glade to the [facility] outpatient pharmacy.  Please continue taking your medications we also got some blood work today and I will call you with the results  If you have any questions please call the internal medicine clinic at [phone]  Dr. Sanjuana Mae

## 2021-10-24 NOTE — Assessment & Plan Note (Addendum)
Patient's blood pressure today was 137/63, he does not take readings at home.  He denies any headaches nausea or vomiting.  Last BMP was done on May 28, 2021.  Denies any chest pain, or intermittent claudication.  He is currently on metoprolol 25 mg (1/2 tablet twice daily), lisinopril-hydrochlorothiazide 20-12.5 once a day.  His lisinopril hydrochlorothiazide was increased from 10-12 0.5 in February 2023.  Patient claims to have compliance with medications.   Plan:  - BMP was done today, will update patient's on results - Continue current medical regimen - Encourage patient to exercise, and to eat better. -- I refilled his medications today

## 2021-10-24 NOTE — Assessment & Plan Note (Addendum)
Patient's A1c today was 11.0, which is increased since his last A1c in February which was 10.5.  Patient checks his glucose at home and states that it is always around 200.  He denies any polyuria or polydipsia.  He also denies any signs of hypoglycemia.  He is currently on metformin at 1000 mg twice daily, and was supposed to be on Farxiga 5 mg.  He went to the pharmacy to get his Wilder Glade when he was prescribed it back in February, however he stated that it was too expensive.  He was under the impression that the metformin also contained the [place] in a combined pill.  On physical exam, his distal pulses were palpated +2, and there were no blisters or ulcers on his feet.  Plan:  - I educated him on his medications a little bit more today, and prescribed Farxiga 10 mg once a day to the [facility] outpatient pharmacy where they will be cheaper for him. - Educated him on the importance of having a good diet and exercising - We will follow-up in 3 months for another A1c check. -- I refilled his metformin today

## 2021-10-24 NOTE — Progress Notes (Signed)
CC: T2DM follow up  HPI:  Kurt Mathis is a 61 y.o. male living with a history stated below and presents today for a diabetes follow up. Please see problem based assessment and plan for additional details.  Past Medical History:  Diagnosis Date   Cardiomyopathy [facility])    Coronary artery disease    a. 12/2015: NSTEMI w/ cath showing severe multivessel disease. CABG recommended. b. CABG on 01/01/2016 w/ LIMA-LAD, SVG-RCA-PDA, SVG-D1, and Left Radial-OM1.   Diabetes mellitus without complication ([place])    a. A1c elevated to 11.7 in 12/2015.   Fever 11/06/2019   HLD (hyperlipidemia)    NSTEMI (non-ST elevated myocardial infarction) [name])     Current Outpatient Medications on File Prior to Visit  Medication Sig Dispense Refill   aspirin EC 81 MG tablet Take 1 tablet (81 mg total) by mouth daily. 90 tablet 3   bacitracin 500 UNIT/GM ointment Apply topically 2 (two) times daily. 15 g 0   blood glucose meter kit and supplies Dispense based on patient and insurance preference. Use up to four times daily as directed. (FOR ICD-9 250.00, 250.01). 1 each 0   No current facility-administered medications on file prior to visit.    Family History  Problem Relation Age of Onset   Hypertension Mother    Heart attack Neg Hx     Social History   Socioeconomic History   Marital status: Single    Spouse name: Not on file   Number of children: Not on file   Years of education: Not on file   Highest education level: Not on file  Occupational History   Not on file  Tobacco Use   Smoking status: Never   Smokeless tobacco: Never  Vaping Use   Vaping Use: Never used  Substance and Sexual Activity   Alcohol use: Not Currently   Drug use: No   Sexual activity: Not on file  Other Topics Concern   Not on file  Social History Narrative   Not on file   Social Determinants of Health   Financial Resource Strain: Not on file  Food Insecurity: Not on file  Transportation Needs: Not  on file  Physical Activity: Not on file  Stress: Not on file  Social Connections: Not on file  Intimate Partner Violence: Not on file    Review of Systems: ROS negative except for what is noted on the assessment and plan.  Vitals:   10/24/21 0845  BP: 137/63  Pulse: 74  Temp: 97.9 F (36.6 C)  TempSrc: Oral  SpO2: 99%  Weight: 166 lb 3.2 oz (75.4 kg)    Physical Exam: Constitutional: well-appearing man sitting in comfortably, in a rush to get to work, in no acute distress Cardiovascular: regular rate and rhythm, no m/r/g, +2 distal pulses Pulmonary/Chest: normal work of breathing on room air, lungs clear to auscultation bilaterally MSK: normal bulk and tone, no ulcers or blisters in feet Neurological: alert & oriented x 3, 5/5 strength in bilateral upper and lower extremities, normal gait, full sensation in feet Skin: warm and dry  Assessment & Plan:   Essential hypertension Patient's blood pressure today was 137/63, he does not take readings at home.  He denies any headaches nausea or vomiting.  Last BMP was done on May 28, 2021.  Denies any chest pain, or intermittent claudication.  He is currently on metoprolol 25 mg (1/2 tablet twice daily), lisinopril-hydrochlorothiazide 20-12.5 once a day.  His lisinopril hydrochlorothiazide was increased from 10-12 0.5  in February 2023.  Patient claims to have compliance with medications.   Plan:  - BMP was done today, will update patient's on results - Continue current medical regimen - Encourage patient to exercise, and to eat better. -- I refilled his medications today  Dyslipidemia Patient is currently on Lipitor 40 mg.  He denies any chest pain, intermittent claudication.  He is on atorvastatin because of his history of an NSTEMI.  He has not had a lipid panel done since 2017.  Plan:  - Lipid panel was done today in the clinic, will call patient's upon results. - Continue current medical regimen, will review once lipid  results are back. -- Refilled his medication  Diabetes mellitus type 2 in nonobese ([name]) Patient's A1c today was 11.0, which is increased since his last A1c in February which was 10.5.  Patient checks his glucose at home and states that it is always around 200.  He denies any polyuria or polydipsia.  He also denies any signs of hypoglycemia.  He is currently on metformin at 1000 mg twice daily, and was supposed to be on Farxiga 5 mg.  He went to the pharmacy to get his [place] when he was prescribed it back in February, however he stated that it was too expensive.  He was under the impression that the metformin also contained the [place] in a combined pill.  On physical exam, his distal pulses were palpated +2, and there were no blisters or ulcers on his feet.  Plan:  - I I educated him on his medications a little bit more today, and prescribed Farxiga 10 mg once a day to the [facility] outpatient pharmacy where they will be cheaper for him. - Educated him on the importance of having a good diet and exercising - We will follow-up in 3 months for another A1c check. -- I refilled his metformin today  Patient seen with Dr. Ronny Bacon Berk Pilot, M.D. [facility], PGY-1 Phone: [phone] Date 10/24/2021 Time 5:48 PM

## 2021-10-24 NOTE — Assessment & Plan Note (Addendum)
Patient is currently on Lipitor 40 mg.  He denies any chest pain, intermittent claudication.  He is on atorvastatin because of his history of an NSTEMI.  He has not had a lipid panel done since 2017.  Plan:  - Lipid panel was done today in the clinic, will call patient's upon results. - Continue current medical regimen, will review once lipid results are back. -- Refilled his medication

## 2021-10-25 LAB — BMP8+ANION GAP
Anion Gap: 15 mmol/L (ref 10.0–18.0)
BUN/Creatinine Ratio: 24 (ref 10–24)
BUN: 21 mg/dL (ref 8–27)
CO2: 25 mmol/L (ref 20–29)
Calcium: 9.7 mg/dL (ref 8.6–10.2)
Chloride: 97 mmol/L (ref 96–106)
Creatinine, Ser: 0.88 mg/dL (ref 0.76–1.27)
Glucose: 276 mg/dL — ABNORMAL HIGH (ref 70–99)
Potassium: 5.5 mmol/L — ABNORMAL HIGH (ref 3.5–5.2)
Sodium: 137 mmol/L (ref 134–144)
eGFR: 98 mL/min/{1.73_m2} (ref >59)

## 2021-10-25 LAB — LIPID PANEL
Chol/HDL Ratio: 5.3 ratio — ABNORMAL HIGH (ref 0.0–5.0)
Cholesterol, Total: 122 mg/dL (ref 100–199)
HDL: 23 mg/dL — ABNORMAL LOW (ref >39)
LDL Chol Calc (NIH): 62 mg/dL (ref 0–99)
Triglycerides: 225 mg/dL — ABNORMAL HIGH (ref 0–149)
VLDL Cholesterol Cal: 37 mg/dL (ref 5–40)

## 2021-10-26 ENCOUNTER — Other Ambulatory Visit (HOSPITAL_COMMUNITY): Payer: Self-pay

## 2021-10-26 NOTE — Telephone Encounter (Signed)
Pt was called today about lab results, which look similar to his previous lab results from a few years back. Will monitor, however pt is not complaining of any headaches, or claudication.

## 2021-10-29 ENCOUNTER — Other Ambulatory Visit (HOSPITAL_COMMUNITY): Payer: Self-pay

## 2021-10-30 ENCOUNTER — Other Ambulatory Visit (HOSPITAL_COMMUNITY): Payer: Self-pay

## 2021-11-01 NOTE — Progress Notes (Signed)
Internal Medicine Clinic Attending  I saw and evaluated the patient.  I personally confirmed the key portions of the history and exam documented by Dr. Nooruddin and I reviewed pertinent patient test results.  The assessment, diagnosis, and plan were formulated together and I agree with the documentation in the resident's note.  

## 2021-11-19 ENCOUNTER — Encounter: Payer: Self-pay | Admitting: Student

## 2021-11-30 ENCOUNTER — Other Ambulatory Visit (HOSPITAL_COMMUNITY): Payer: Self-pay

## 2021-12-03 ENCOUNTER — Other Ambulatory Visit (HOSPITAL_COMMUNITY): Payer: Self-pay

## 2021-12-31 ENCOUNTER — Other Ambulatory Visit (HOSPITAL_COMMUNITY): Payer: Self-pay

## 2022-01-28 ENCOUNTER — Other Ambulatory Visit (HOSPITAL_COMMUNITY): Payer: Self-pay

## 2022-03-04 ENCOUNTER — Other Ambulatory Visit (HOSPITAL_COMMUNITY): Payer: Self-pay

## 2022-04-03 ENCOUNTER — Other Ambulatory Visit (HOSPITAL_COMMUNITY): Payer: Self-pay

## 2022-04-19 ENCOUNTER — Other Ambulatory Visit: Payer: Self-pay | Admitting: Student

## 2022-04-19 DIAGNOSIS — I214 Non-ST elevation (NSTEMI) myocardial infarction: Secondary | ICD-10-CM

## 2022-05-06 ENCOUNTER — Other Ambulatory Visit (HOSPITAL_COMMUNITY): Payer: Self-pay

## 2022-05-07 ENCOUNTER — Other Ambulatory Visit (HOSPITAL_COMMUNITY): Payer: Self-pay

## 2022-05-15 ENCOUNTER — Encounter: Payer: Self-pay | Admitting: Internal Medicine

## 2022-05-15 NOTE — Progress Notes (Deleted)
   CC: A1c check  HPI:Mr.Vitor Overbaugh is a 62 y.o. male who presents for evaluation of ***. Please see individual problem based A/P for details.  68 yom hx DM2, HTN, HLD, CAD s/p CABGX5 and NSTEMI presenting for DM  DM2 Last A1c 11 Metformin 1000 BID, farxiga 10 - wegovy would be good for acs protection  HTN Metop, lisinopril-hctz 20-12.5, farxiga 10  HLD Ascvd 19% - need zetia  CABG - asa  What pharmacy?  Depression, PHQ-9: Based on the patients  [place] Visit from 10/24/2021 in [place]  PHQ-9 Total Score 0      score we have ***.  Past Medical History:  Diagnosis Date   Cardiomyopathy [facility])    Coronary artery disease    a. 12/2015: NSTEMI w/ cath showing severe multivessel disease. CABG recommended. b. CABG on 01/01/2016 w/ LIMA-LAD, SVG-RCA-PDA, SVG-D1, and Left Radial-OM1.   Diabetes mellitus without complication ([place])    a. A1c elevated to 11.7 in 12/2015.   Fever 11/06/2019   HLD (hyperlipidemia)    NSTEMI (non-ST elevated myocardial infarction) ([place])    Review of Systems:   ROS   Physical Exam: There were no vitals filed for this visit.   General: *** HEENT: Conjunctiva nl , antiicteric sclerae, moist mucous membranes, no exudate or erythema Cardiovascular: Normal rate, regular rhythm.  No murmurs, rubs, or gallops Pulmonary : Equal breath sounds, No wheezes, rales, or rhonchi Abdominal: soft, nontender,  bowel sounds present Ext: No edema in lower extremities, no tenderness to palpation of lower extremities.   Assessment & Plan:   See Encounters Tab for problem based charting.  Patient {GC/GE:[id number]::"discussed with","seen with"} Dr. {ZYSAY:[id number]::"X. Hoffman","Guilloud","Mullen","Narendra","Raines","Vincent","Williams"}

## 2022-05-22 ENCOUNTER — Ambulatory Visit: Payer: Self-pay

## 2022-05-22 ENCOUNTER — Other Ambulatory Visit: Payer: Self-pay

## 2022-05-22 VITALS — BP 129/68 | HR 66 | Ht 71.0 in | Wt 160.1 lb

## 2022-05-22 DIAGNOSIS — L409 Psoriasis, unspecified: Secondary | ICD-10-CM

## 2022-05-22 DIAGNOSIS — Z7984 Long term (current) use of oral hypoglycemic drugs: Secondary | ICD-10-CM

## 2022-05-22 DIAGNOSIS — E119 Type 2 diabetes mellitus without complications: Secondary | ICD-10-CM

## 2022-05-22 DIAGNOSIS — I214 Non-ST elevation (NSTEMI) myocardial infarction: Secondary | ICD-10-CM

## 2022-05-22 DIAGNOSIS — E875 Hyperkalemia: Secondary | ICD-10-CM

## 2022-05-22 DIAGNOSIS — Z794 Long term (current) use of insulin: Secondary | ICD-10-CM

## 2022-05-22 DIAGNOSIS — I10 Essential (primary) hypertension: Secondary | ICD-10-CM

## 2022-05-22 LAB — GLUCOSE, CAPILLARY: Glucose-Capillary: 191 mg/dL — ABNORMAL HIGH (ref 70–99)

## 2022-05-22 LAB — POCT GLYCOSYLATED HEMOGLOBIN (HGB A1C): Hemoglobin A1C: 9.1 % — AB (ref 4.0–5.6)

## 2022-05-22 MED ORDER — LIRAGLUTIDE 18 MG/3ML ~~LOC~~ SOPN: PEN_INJECTOR | SUBCUTANEOUS | 0 refills | Status: DC

## 2022-05-22 MED ORDER — CLOBETASOL PROPIONATE 0.05 % EX OINT: 1.0000 | TOPICAL_OINTMENT | Freq: Two times a day (BID) | CUTANEOUS | 1 refills | Status: DC

## 2022-05-22 MED ORDER — PEN NEEDLES 32G X 4 MM MISC: 1 refills | Status: DC

## 2022-05-22 MED ORDER — METOPROLOL TARTRATE 25 MG PO TABS: ORAL_TABLET | ORAL | 3 refills | Status: DC

## 2022-05-22 MED ORDER — DAPAGLIFLOZIN PROPANEDIOL 10 MG PO TABS: 10.0000 mg | ORAL_TABLET | Freq: Every day | ORAL | 11 refills | Status: DC

## 2022-05-22 MED ORDER — LISINOPRIL-HYDROCHLOROTHIAZIDE 20-12.5 MG PO TABS: 1.0000 | ORAL_TABLET | Freq: Every day | ORAL | 3 refills | Status: DC

## 2022-05-22 MED ORDER — ALCOHOL WIPES 70 % EX MISC: CUTANEOUS | 1 refills | Status: AC

## 2022-05-22 MED ORDER — ATORVASTATIN CALCIUM 40 MG PO TABS: ORAL_TABLET | ORAL | 0 refills | Status: DC

## 2022-05-22 MED ORDER — METOPROLOL TARTRATE 25 MG PO TABS: ORAL_TABLET | ORAL | 0 refills | Status: DC

## 2022-05-22 MED ORDER — METFORMIN HCL 1000 MG PO TABS: 1000.0000 mg | ORAL_TABLET | Freq: Two times a day (BID) | ORAL | 3 refills | Status: DC

## 2022-05-22 NOTE — Patient Instructions (Signed)
Thank you, [name] for allowing [place] to provide your care today. Today we discussed :  Blood pressure: This looks good, continue taking your current medication  Diabetes: Your A1c is still elevated to 9.1%. Our goal is less than 7%. We will add a daily injectable medication called victoza. Inject this into the skin on the abdomen once per day every day. You will take 0.60m daily for 7 days, then start taking 1.260mdaily. We will see you back in a month.  Psoriasis: Your skin rash on your elbows, scalp, ears is psoriasis. You should put ointment on this such as vaseline or something with similar consistency. [facility]. If this is too expensive do not get it. We will need to get you the orange card so that we can get longer term treatment.  I have ordered the following labs for you:   Lab Orders         Glucose, capillary         BMP8+Anion Gap         POC Hbg A1C          I have ordered the following medication/changed the following medications:   Stop the following medications: Medications Discontinued During This Encounter  Medication Reason   metoprolol tartrate (LOPRESSOR) 25 MG tablet Reorder   lisinopril-hydrochlorothiazide (ZESTORETIC) 20-12.5 MG tablet Reorder   metFORMIN (GLUCOPHAGE) 1000 MG tablet Reorder   metoprolol tartrate (LOPRESSOR) 25 MG tablet Reorder   dapagliflozin propanediol (FARXIGA) 10 MG TABS tablet Reorder   atorvastatin (LIPITOR) 40 MG tablet Reorder     Start the following medications: Meds ordered this encounter  Medications   clobetasol ointment (TEMOVATE) 0.05 %    Sig: Apply 1 Application topically 2 (two) times daily.    Dispense:  30 g    Refill:  1   liraglutide (VICTOZA) 18 MG/3ML SOPN    Sig: Inject 0.6 mg into the skin daily for 7 days, THEN 1.2 mg daily for 21 days.    Dispense:  4.9 mL    Refill:  0    IM PROGRAM   Insulin Pen Needle (PEN NEEDLES) 32G X 4 MM MISC    Sig: Use 1 new needle daily     Dispense:  30 each    Refill:  1    IM Program   Isopropyl Alcohol (ALCOHOL WIPES) 70 % MISC    Sig: Wipe skin prior to use of victoza    Dispense:  40 each    Refill:  1    IM program   atorvastatin (LIPITOR) 40 MG tablet    Sig: TAKE 1 TABLET BY MOUTH ONCE DAILY AT  6PM    Dispense:  90 tablet    Refill:  0   lisinopril-hydrochlorothiazide (ZESTORETIC) 20-12.5 MG tablet    Sig: Take 1 tablet by mouth daily.    Dispense:  90 tablet    Refill:  3   metFORMIN (GLUCOPHAGE) 1000 MG tablet    Sig: Take 1 tablet (1,000 mg total) by mouth 2 (two) times daily with a meal.    Dispense:  180 tablet    Refill:  3   dapagliflozin propanediol (FARXIGA) 10 MG TABS tablet    Sig: Take 1 tablet (10 mg total) by mouth daily.    Dispense:  30 tablet    Refill:  11    IM program  Dr approved to change to 1067m daily per secure chat on 10-24-21 sdm  metoprolol tartrate (LOPRESSOR) 25 MG tablet    Sig: Take 1/2 (one-half) tablet by mouth twice daily    Dispense:  90 tablet    Refill:  0   metoprolol tartrate (LOPRESSOR) 25 MG tablet    Sig: Take 1/2 (one-half) tablet by mouth twice daily    Dispense:  90 tablet    Refill:  3     Follow up: 1 month    We look forward to seeing you next time. Please call our clinic at [phone] if you have any questions or concerns. The best time to call is Monday-Friday from 9am-4pm, but there is someone available 24/7. If after hours or the weekend, call the main hospital number and ask for the Internal Medicine Resident On-Call. If you need medication refills, please notify your pharmacy one week in advance and they will send [place] a request.   Thank you for trusting me with your care. Wishing you the best!   Iona Coach, MD [place]

## 2022-05-22 NOTE — Progress Notes (Deleted)
CAD s/p CABG x5  -asa 81  HTN He is currently on metoprolol 25 mg (1/2 tablet twice daily), lisinopril-hydrochlorothiazide 20-12.5 once a day.   T2DM He is currently on metformin at 1000 mg twice daily, and Farxiga 10 mg.  A1c  Dyslipidemia  HCM Optho Microalbumin Colonoscopy   10/2021  BMP-normal renal function Lipid- ldl 62 A1c 11

## 2022-05-23 ENCOUNTER — Other Ambulatory Visit (HOSPITAL_COMMUNITY): Payer: Self-pay

## 2022-05-23 DIAGNOSIS — L409 Psoriasis, unspecified: Secondary | ICD-10-CM | POA: Insufficient documentation

## 2022-05-23 NOTE — Assessment & Plan Note (Signed)
Initial BP 150/73, normalized on repeat. Will not add additional agents at this time. Will get bmp given mild hyperkalemia to 5.5 at last visit and also to check renal function. - metoprolol 25 mg (1/2 tablet twice daily) -lisinopril-hydrochlorothiazide 20-12.5 once a day.

## 2022-05-23 NOTE — Assessment & Plan Note (Addendum)
He is currently on metformin at 1000 mg twice daily, and Farxiga 10 mg. A1c improved from 11 to 9.1%. Will need an additional agent and GLP-1 will have good additional cardioprotective role given his prior 5 vessel CABG. -start victoza 0.6 daily for 7 days, increase to 1.8m daily after -start pen needles 32x4 -continue metformin 1000 BID -continue farxiga 165mdaily -f/u 1 month for victoza titration -A1c in 3 months -given orange card application, but get in clinic retinal exam at next visit

## 2022-05-23 NOTE — Assessment & Plan Note (Signed)
Patient reports 1-2 years of red plaques on his elbows that get very dry and bleed when he picks them. On exam he has the elbow plaques with some scale, Bilateral external auditory canal plaques with scale, and plaques with scale around the hairline of the forehead. Suspect there is less scale noted on the elbows due to his use of moisturizer. He says other than the appearance these do not bother him much, denies joint pain. Patient does not have insurance, did instruct not to pick up steroid if too expensive. Could consider biopsy in the future if he is able to get on initial treatments without improvement. Once he gets orange card could refer to dermatology for advanced therapies such as biologics. -Vaseline or other similar ointment -clobetasol 0.05%

## 2022-05-23 NOTE — Progress Notes (Signed)
Established Patient Office Visit  Subjective   Patient ID: Kurt Mathis, male    DOB: July 23, 1960  Age: 62 y.o. MRN: [id number]  Chief Complaint  Patient presents with   Follow-up    T2DM   Rash    Right elbow > 6 months    [name] is a 62 y/o male with a pmh outlined below. Please see A&P for HPI information.  Rash      Review of Systems  Skin:  Positive for rash.  All other systems reviewed and are negative.     Objective:     BP 129/68 (BP Location: Left Arm, Patient Position: Sitting, Cuff Size: Small)   Pulse 66   Ht 5' 11"$  (1.803 m)   Wt 160 lb 1.6 oz (72.6 kg)   SpO2 98%   BMI 22.33 kg/m    Physical Exam Constitutional:      General: He is not in acute distress.    Appearance: Normal appearance. He is normal weight.  Eyes:     General: No scleral icterus.    Extraocular Movements: Extraocular movements intact.     Conjunctiva/sclera: Conjunctivae normal.     Pupils: Pupils are equal, round, and reactive to light.  Cardiovascular:     Rate and Rhythm: Normal rate.     Pulses: Normal pulses.     Heart sounds: Normal heart sounds. No murmur heard.    No gallop.  Pulmonary:     Effort: Pulmonary effort is normal. No respiratory distress.     Breath sounds: Normal breath sounds. No wheezing or rales.  Musculoskeletal:     Right lower leg: No edema.     Left lower leg: No edema.  Skin:    General: Skin is warm and dry.     Capillary Refill: Capillary refill takes less than 2 seconds.     Comments: has the elbow plaques with some scale, Bilateral external auditory canal plaques with scale, and plaques with scale around the hairline of the forehead.  Neurological:     Mental Status: He is alert.  Psychiatric:        Mood and Affect: Mood normal.        Behavior: Behavior normal.      Results for orders placed or performed in visit on 05/22/22  Glucose, capillary  Result Value Ref Range   Glucose-Capillary 191 (H) 70 - 99 mg/dL   POC Hbg A1C  Result Value Ref Range   Hemoglobin A1C 9.1 (A) 4.0 - 5.6 %   HbA1c POC (<> result, manual entry)     HbA1c, POC (prediabetic range)     HbA1c, POC (controlled diabetic range)        The ASCVD Risk score (Arnett DK, et al., 2019) failed to calculate for the following reasons:   The patient has a prior MI or stroke diagnosis    Assessment & Plan:   Problem List Items Addressed This Visit       Cardiovascular and Mediastinum   NSTEMI (non-ST elevated myocardial infarction) ([place])   Relevant Medications   atorvastatin (LIPITOR) 40 MG tablet   lisinopril-hydrochlorothiazide (ZESTORETIC) 20-12.5 MG tablet   metoprolol tartrate (LOPRESSOR) 25 MG tablet   metoprolol tartrate (LOPRESSOR) 25 MG tablet   Essential hypertension    Initial BP 150/73, normalized on repeat. Will not add additional agents at this time. Will get bmp given mild hyperkalemia to 5.5 at last visit and also to check renal function. - metoprolol 25 mg (1/2  tablet twice daily) -lisinopril-hydrochlorothiazide 20-12.5 once a day.       Relevant Medications   atorvastatin (LIPITOR) 40 MG tablet   lisinopril-hydrochlorothiazide (ZESTORETIC) 20-12.5 MG tablet   dapagliflozin propanediol (FARXIGA) 10 MG TABS tablet   metoprolol tartrate (LOPRESSOR) 25 MG tablet   metoprolol tartrate (LOPRESSOR) 25 MG tablet     Endocrine   Diabetes mellitus type 2 in nonobese ([place]) - Primary    He is currently on metformin at 1000 mg twice daily, and Farxiga 10 mg. A1c improved from 11 to 9.1%. Will need an additional agent and GLP-1 will have good additional cardioprotective role given his prior 5 vessel CABG. -start victoza 0.6 daily for 7 days, increase to 1.39m daily after -start pen needles 32x4 -continue metformin 1000 BID -continue farxiga 171mdaily -f/u 1 month for victoza titration -A1c in 3 months -given orange card application, but get in clinic retinal exam at next visit      Relevant Medications    liraglutide (VICTOZA) 18 MG/3ML SOPN   Insulin Pen Needle (PEN NEEDLES) 32G X 4 MM MISC   Isopropyl Alcohol (ALCOHOL WIPES) 70 % MISC   atorvastatin (LIPITOR) 40 MG tablet   lisinopril-hydrochlorothiazide (ZESTORETIC) 20-12.5 MG tablet   metFORMIN (GLUCOPHAGE) 1000 MG tablet   dapagliflozin propanediol (FARXIGA) 10 MG TABS tablet   Other Relevant Orders   POC Hbg A1C (Completed)     Musculoskeletal and Integument   Psoriasis    Patient reports 1-2 years of red plaques on his elbows that get very dry and bleed when he picks them. On exam he has the elbow plaques with some scale, Bilateral external auditory canal plaques with scale, and plaques with scale around the hairline of the forehead. Suspect there is less scale noted on the elbows due to his use of moisturizer. He says other than the appearance these do not bother him much, denies joint pain. Patient does not have insurance, did instruct not to pick up steroid if too expensive. Could consider biopsy in the future if he is able to get on initial treatments without improvement. Once he gets orange card could refer to dermatology for advanced therapies such as biologics. -Vaseline or other similar ointment -clobetasol 0.05%      Relevant Medications   clobetasol ointment (TEMOVATE) 0.05 %   Other Visit Diagnoses     Hyperkalemia       Relevant Orders   BMP8+Anion Gap       No follow-ups on file.    TyIona CoachMD

## 2022-05-24 ENCOUNTER — Other Ambulatory Visit (HOSPITAL_COMMUNITY): Payer: Self-pay

## 2022-05-24 LAB — BMP8+ANION GAP
Anion Gap: 19 mmol/L — ABNORMAL HIGH (ref 10.0–18.0)
BUN/Creatinine Ratio: 28 — ABNORMAL HIGH (ref 10–24)
BUN: 25 mg/dL (ref 8–27)
CO2: 21 mmol/L (ref 20–29)
Calcium: 9.8 mg/dL (ref 8.6–10.2)
Chloride: 101 mmol/L (ref 96–106)
Creatinine, Ser: 0.9 mg/dL (ref 0.76–1.27)
Glucose: 173 mg/dL — ABNORMAL HIGH (ref 70–99)
Potassium: 4.5 mmol/L (ref 3.5–5.2)
Sodium: 141 mmol/L (ref 134–144)
eGFR: 97 mL/min/{1.73_m2} (ref >59)

## 2022-05-24 MED ORDER — DAPAGLIFLOZIN PROPANEDIOL 10 MG PO TABS
10.0000 mg | ORAL_TABLET | Freq: Every day | ORAL | 11 refills | Status: DC
  Filled 2022-05-24 – 2022-09-04 (×2): qty 30, 30d supply, fill #0
  Filled 2022-11-13: qty 30, 30d supply, fill #1
  Filled 2022-12-16: qty 30, 30d supply, fill #2
  Filled 2023-01-21: qty 30, 30d supply, fill #3
  Filled 2023-02-18 – 2023-04-04 (×3): qty 30, 30d supply, fill #4
  Filled 2023-05-08: qty 30, 30d supply, fill #5

## 2022-05-24 NOTE — Progress Notes (Signed)
Called patient and discussed normal kidney function and electrolytes on BMP. No management change. [facility]

## 2022-05-24 NOTE — Addendum Note (Signed)
Addended by: Iona Coach on: 05/24/2022 08:23 AM   Modules accepted: Orders

## 2022-05-27 NOTE — Progress Notes (Signed)
Internal Medicine Clinic Attending  Case discussed with Dr. Stann Mainland  at the time of the visit.  We reviewed the resident's history and exam and pertinent patient test results.  I agree with the assessment, diagnosis, and plan of care documented in the resident's note.

## 2022-06-04 ENCOUNTER — Other Ambulatory Visit (HOSPITAL_COMMUNITY): Payer: Self-pay

## 2022-07-02 ENCOUNTER — Other Ambulatory Visit (HOSPITAL_COMMUNITY): Payer: Self-pay

## 2022-07-02 ENCOUNTER — Other Ambulatory Visit: Payer: Self-pay

## 2022-07-02 DIAGNOSIS — L409 Psoriasis, unspecified: Secondary | ICD-10-CM

## 2022-07-02 MED ORDER — CLOBETASOL PROPIONATE 0.05 % EX OINT: 1.0000 | TOPICAL_OINTMENT | Freq: Two times a day (BID) | CUTANEOUS | 1 refills | Status: DC

## 2022-08-05 ENCOUNTER — Other Ambulatory Visit (HOSPITAL_COMMUNITY): Payer: Self-pay

## 2022-08-07 ENCOUNTER — Other Ambulatory Visit: Payer: Self-pay

## 2022-09-01 ENCOUNTER — Other Ambulatory Visit: Payer: Self-pay | Admitting: Internal Medicine

## 2022-09-01 DIAGNOSIS — L409 Psoriasis, unspecified: Secondary | ICD-10-CM

## 2022-09-04 ENCOUNTER — Other Ambulatory Visit: Payer: Self-pay | Admitting: *Deleted

## 2022-09-04 ENCOUNTER — Other Ambulatory Visit (HOSPITAL_COMMUNITY): Payer: Self-pay

## 2022-09-04 DIAGNOSIS — L409 Psoriasis, unspecified: Secondary | ICD-10-CM

## 2022-09-04 MED ORDER — CLOBETASOL PROPIONATE 0.05 % EX OINT: TOPICAL_OINTMENT | CUTANEOUS | 0 refills | Status: DC

## 2022-09-04 NOTE — Telephone Encounter (Signed)
Call from pt requesting a larger size of Clobetasol ointment. He stated it works very well for skin but only last a week at a time.Stated he does not need a refill until next week since he just pick it up.

## 2022-09-11 ENCOUNTER — Encounter: Payer: Self-pay | Admitting: *Deleted

## 2022-09-12 ENCOUNTER — Other Ambulatory Visit (HOSPITAL_COMMUNITY): Payer: Self-pay

## 2022-10-15 ENCOUNTER — Other Ambulatory Visit (HOSPITAL_COMMUNITY): Payer: Self-pay

## 2022-11-13 ENCOUNTER — Other Ambulatory Visit: Payer: Self-pay | Admitting: Internal Medicine

## 2022-11-13 ENCOUNTER — Other Ambulatory Visit (HOSPITAL_COMMUNITY): Payer: Self-pay

## 2022-11-13 DIAGNOSIS — I214 Non-ST elevation (NSTEMI) myocardial infarction: Secondary | ICD-10-CM

## 2022-11-13 NOTE — Telephone Encounter (Signed)
Please schedule pt an appt soon per Dr Hessie Diener. Thanks

## 2022-12-04 ENCOUNTER — Encounter: Payer: Self-pay | Admitting: Student

## 2022-12-05 ENCOUNTER — Other Ambulatory Visit (HOSPITAL_COMMUNITY): Payer: Self-pay

## 2022-12-16 ENCOUNTER — Other Ambulatory Visit (HOSPITAL_COMMUNITY): Payer: Self-pay

## 2022-12-16 ENCOUNTER — Other Ambulatory Visit: Payer: Self-pay

## 2022-12-18 ENCOUNTER — Telehealth: Payer: Self-pay

## 2022-12-18 NOTE — Telephone Encounter (Signed)
IM to PAP

## 2022-12-20 ENCOUNTER — Other Ambulatory Visit (HOSPITAL_COMMUNITY): Payer: Self-pay

## 2022-12-23 ENCOUNTER — Other Ambulatory Visit (HOSPITAL_COMMUNITY): Payer: Self-pay

## 2022-12-23 ENCOUNTER — Other Ambulatory Visit: Payer: Self-pay

## 2022-12-23 ENCOUNTER — Encounter: Payer: Self-pay | Admitting: Student

## 2022-12-23 ENCOUNTER — Ambulatory Visit: Payer: Self-pay | Admitting: Student

## 2022-12-23 VITALS — BP 148/69 | HR 64 | Temp 97.7°F | Ht 71.0 in | Wt 158.7 lb

## 2022-12-23 DIAGNOSIS — I10 Essential (primary) hypertension: Secondary | ICD-10-CM

## 2022-12-23 DIAGNOSIS — Z7984 Long term (current) use of oral hypoglycemic drugs: Secondary | ICD-10-CM

## 2022-12-23 DIAGNOSIS — E119 Type 2 diabetes mellitus without complications: Secondary | ICD-10-CM

## 2022-12-23 DIAGNOSIS — E1169 Type 2 diabetes mellitus with other specified complication: Secondary | ICD-10-CM

## 2022-12-23 LAB — POCT GLYCOSYLATED HEMOGLOBIN (HGB A1C): Hemoglobin A1C: 10.9 % — AB (ref 4.0–5.6)

## 2022-12-23 LAB — GLUCOSE, CAPILLARY: Glucose-Capillary: 266 mg/dL — ABNORMAL HIGH (ref 70–99)

## 2022-12-23 MED ORDER — LIRAGLUTIDE 18 MG/3ML ~~LOC~~ SOPN
PEN_INJECTOR | SUBCUTANEOUS | 0 refills | Status: DC
  Filled 2022-12-23: qty 4.9, 28d supply, fill #0
  Filled 2023-02-27: qty 3, 15d supply, fill #0

## 2022-12-23 MED ORDER — LISINOPRIL-HYDROCHLOROTHIAZIDE 20-25 MG PO TABS
1.0000 | ORAL_TABLET | Freq: Every day | ORAL | 3 refills | Status: DC
  Filled 2022-12-23: qty 30, 30d supply, fill #0
  Filled 2023-01-21: qty 30, 30d supply, fill #1
  Filled 2023-02-18 – 2023-02-28 (×2): qty 30, 30d supply, fill #2
  Filled 2023-04-04 (×2): qty 30, 30d supply, fill #3

## 2022-12-23 NOTE — Telephone Encounter (Signed)
Mailing [employer] app to pts home for [facility]

## 2022-12-23 NOTE — Patient Instructions (Addendum)
Thank you so much for coming to the clinic today!   For your blood pressure, I am increasing the dose of your lisinopril-hydrochlorothiazide (Zestoretic), please start taking this new instead of the 1 you are taking before.  I have also sent in the prescription for the Victoza, he was started 0.6 mg, and can increase after 4 weeks.  Will see you back in about a month to follow-up.  For your back pain, I think you may have something called sacroiliitis, and the best way to treat this is through stretches and probably getting your comfortable shoes.  Try using some heating pads on that area, and if the pain gets bad you can use Aleve or Motrin to help.  If you have any questions please feel free to the call the clinic at anytime at [phone]. It was a pleasure seeing you!  Best, Dr. Vergie Living gracias por venir a la clnica hoy!   Para su presin arterial, estoy aumentando la dosis de lisinopril-hidroclorotiazida (Zestoretic), comience a tomar este nuevo en lugar del que estaba tomando antes.  Tambin envi la receta para Victoza, comenz con 0,6 mg y puede aumentar despus de 4 semanas.  Nos vemos de regreso en aproximadamente un mes para [employer].  Para su dolor de espalda, creo que puede tener algo llamado sacroiletis, y la mejor [place] de tratarlo es mediante estiramientos y probablemente consiguiendo zapatos cmodos.  Intente usar algunas almohadillas trmicas en esa rea y, si el dolor empeora, puede usar Aleve o Motrin para ayudar.  Si tiene [employer], no dude en llamar a la clnica en cualquier momento al [phone]. Fue Psychiatrist verte!  Mejor, Dr.Antonique Langford

## 2022-12-23 NOTE — Assessment & Plan Note (Signed)
Patient presents for follow-up for his diabetes.  His regimen includes metformin at 1000 mg twice daily, and Farxiga 10 mg.  His A1c has worsened from 9.1-10.9 today.  He was supposed to be started on Victoza at his last visit, however had trouble obtaining the medication given the price.  Will restart via the IM program.  He does check his sugars at home which are consistently in the 200 range per him, however he did not bring in his readings at this time.  Plan: - Continue metformin at 1000 mg twice a day - Continue Farxiga 10 mg daily - Start Victoza 0.6 mg daily, will follow-up with patient if he was able to get this medication. - Consider starting insulin at next visit

## 2022-12-23 NOTE — Telephone Encounter (Addendum)
Mailing [place] application to pt home.

## 2022-12-24 ENCOUNTER — Other Ambulatory Visit (HOSPITAL_COMMUNITY): Payer: Self-pay

## 2022-12-24 NOTE — Assessment & Plan Note (Signed)
Blood pressure consistently elevated, check today 159/88, and recheck of 148/69.  Current regimen is metoprolol 12.5 mg twice a day, and lisinopril hydrochlorothiazide 20-12.5.  He states that he just took his medications about 30 minutes to an hour ago.  Regardless, blood pressure notably high on previous encounters as well.  Currently asymptomatic.  Plan: - Continue metoprolol 25 mg (half tablet twice daily) - Will increase lisinopril-hydrochlorothiazide to 20-25, recheck BMP in a month.

## 2022-12-24 NOTE — Progress Notes (Signed)
CC: Diabetes and blood pressure follow-up  HPI:  [name] is a 62 y.o. male living with a history stated below and presents today for diabetes and blood pressure follow-up. Please see problem based assessment and plan for additional details.  Past Medical History:  Diagnosis Date   Cardiomyopathy [facility])    Coronary artery disease    a. 12/2015: NSTEMI w/ cath showing severe multivessel disease. CABG recommended. b. CABG on 01/01/2016 w/ LIMA-LAD, SVG-RCA-PDA, SVG-D1, and Left Radial-OM1.   Diabetes mellitus without complication (HCC)    a. A1c elevated to 11.7 in 12/2015.   Fever 11/06/2019   HLD (hyperlipidemia)    NSTEMI (non-ST elevated myocardial infarction) [facility])     Current Outpatient Medications on File Prior to Visit  Medication Sig Dispense Refill   aspirin EC 81 MG tablet Take 1 tablet (81 mg total) by mouth daily. 90 tablet 3   atorvastatin (LIPITOR) 40 MG tablet TAKE 1 TABLET BY MOUTH ONCE DAILY 6 PM 90 tablet 0   bacitracin 500 UNIT/GM ointment Apply topically 2 (two) times daily. 15 g 0   blood glucose meter kit and supplies Dispense based on patient and insurance preference. Use up to four times daily as directed. (FOR ICD-9 250.00, 250.01). 1 each 0   clobetasol ointment (TEMOVATE) 0.05 % APPLY 1 APPLICATION OF  OINTMENT TOPICALLY TWICE DAILY 45 g 0   dapagliflozin propanediol (FARXIGA) 10 MG TABS tablet Take 1 tablet (10 mg total) by mouth daily. 30 tablet 11   dapagliflozin propanediol (FARXIGA) 10 MG TABS tablet Take 1 tablet (10 mg total) by mouth daily. 30 tablet 11   Insulin Pen Needle (PEN NEEDLES) 32G X 4 MM MISC Use 1 new needle daily 30 each 1   Isopropyl Alcohol (ALCOHOL WIPES) 70 % MISC Wipe skin prior to use of victoza 40 each 1   metFORMIN (GLUCOPHAGE) 1000 MG tablet Take 1 tablet (1,000 mg total) by mouth 2 (two) times daily with a meal. 180 tablet 3   metoprolol tartrate (LOPRESSOR) 25 MG tablet Take 1/2 (one-half) tablet by mouth twice  daily 90 tablet 0   metoprolol tartrate (LOPRESSOR) 25 MG tablet Take 1/2 (one-half) tablet by mouth twice daily 90 tablet 3   No current facility-administered medications on file prior to visit.    Family History  Problem Relation Age of Onset   Hypertension Mother    Heart attack Neg Hx     Social History   Socioeconomic History   Marital status: Single    Spouse name: Not on file   Number of children: Not on file   Years of education: Not on file   Highest education level: Not on file  Occupational History   Not on file  Tobacco Use   Smoking status: Never   Smokeless tobacco: Never  Vaping Use   Vaping status: Never Used  Substance and Sexual Activity   Alcohol use: Not Currently   Drug use: No   Sexual activity: Not on file  Other Topics Concern   Not on file  Social History Narrative   Not on file   Social Determinants of Health   Financial Resource Strain: Not on file  Food Insecurity: Not on file  Transportation Needs: Not on file  Physical Activity: Not on file  Stress: Not on file  Social Connections: Not on file  Intimate Partner Violence: Not on file    Review of Systems: ROS negative except for what is noted on the assessment and  plan.  Vitals:   12/23/22 0905 12/23/22 0928  BP: (!) 159/88 (!) 148/69  Pulse: 70 64  Temp: 97.7 F (36.5 C)   TempSrc: Oral   SpO2: 99%   Weight: 158 lb 11.2 oz (72 kg)   Height: 5\' 11"  (1.803 m)     Physical Exam: Constitutional: well-appearing male in no acute distress Cardiovascular: regular rate and rhythm, no m/r/g Pulmonary/Chest: normal work of breathing on room air, lungs clear to auscultation bilaterally Abdominal: soft, non-tender, non-distended  Assessment & Plan:   Diabetes mellitus type 2 in nonobese [facility]) Patient presents for follow-up for his diabetes.  His regimen includes metformin at 1000 mg twice daily, and Farxiga 10 mg.  His A1c has worsened from 9.1-10.9 today.  He was supposed to be  started on Victoza at his last visit, however had trouble obtaining the medication given the price.  Will restart via the IM program.  He does check his sugars at home which are consistently in the 200 range per him, however he did not bring in his readings at this time.  Plan: - Continue metformin at 1000 mg twice a day - Continue Farxiga 10 mg daily - Start Victoza 0.6 mg daily, will follow-up with patient if he was able to get this medication. - Consider starting insulin at next visit  Essential hypertension Blood pressure consistently elevated, check today 159/88, and recheck of 148/69.  Current regimen is metoprolol 12.5 mg twice a day, and lisinopril hydrochlorothiazide 20-12.5.  He states that he just took his medications about 30 minutes to an hour ago.  Regardless, blood pressure notably high on previous encounters as well.  Currently asymptomatic.  Plan: - Continue metoprolol 25 mg (half tablet twice daily) - Will increase lisinopril-hydrochlorothiazide to 20-25, recheck BMP in a month.  Patient discussed with Dr. Lanelle Bal Tylen Leverich, M.D. [facility] Internal Medicine, PGY-2 Pager: [phone] Date 12/24/2022 Time 7:36 AM

## 2022-12-25 NOTE — Telephone Encounter (Signed)
Submitted application for VICTOZA to [employer] for patient assistance via online portal.   Phone: [phone]

## 2022-12-27 ENCOUNTER — Other Ambulatory Visit (HOSPITAL_COMMUNITY): Payer: Self-pay

## 2022-12-31 NOTE — Telephone Encounter (Signed)
Received notification from [employer] regarding approval for VICTOZA. Patient assistance approved from 12/27/22 to 12/21/23.  Medication will ship to [facility]ps office (internal medicine center)  Pt ID: [id number]  Company phone: [phone]

## 2023-01-01 NOTE — Progress Notes (Signed)
Internal Medicine Clinic Attending  Case discussed with the resident at the time of the visit.  We reviewed the resident's history and exam and pertinent patient test results.  I agree with the assessment, diagnosis, and plan of care documented in the resident's note.  Debe Coder, MD

## 2023-01-15 ENCOUNTER — Other Ambulatory Visit (HOSPITAL_COMMUNITY): Payer: Self-pay

## 2023-01-15 ENCOUNTER — Telehealth: Payer: Self-pay

## 2023-01-15 NOTE — Telephone Encounter (Signed)
Informed patient that his victoza was ready for pickup here at the office.   3 boxes of victoza are ready in med room fridge.

## 2023-01-17 NOTE — Telephone Encounter (Signed)
Rec'd completed portion of patients az&me application.  Placed provider page in blue teams box for signature

## 2023-01-21 ENCOUNTER — Other Ambulatory Visit (HOSPITAL_COMMUNITY): Payer: Self-pay

## 2023-01-23 ENCOUNTER — Other Ambulatory Visit (HOSPITAL_COMMUNITY): Payer: Self-pay

## 2023-01-23 NOTE — Telephone Encounter (Signed)
Patient came by to pick up samples of Victoza.  Given 3 boxes as prepared.

## 2023-01-27 ENCOUNTER — Other Ambulatory Visit: Payer: Self-pay | Admitting: Dietician

## 2023-01-27 ENCOUNTER — Other Ambulatory Visit (HOSPITAL_COMMUNITY): Payer: Self-pay

## 2023-01-27 DIAGNOSIS — E119 Type 2 diabetes mellitus without complications: Secondary | ICD-10-CM

## 2023-01-27 MED ORDER — PEN NEEDLES 32G X 4 MM MISC
1.0000 | Freq: Every day | 1 refills | Status: AC
  Filled 2023-01-27: qty 100, 30d supply, fill #0
  Filled 2023-07-23: qty 100, 30d supply, fill #1

## 2023-01-27 NOTE — Telephone Encounter (Signed)
Called using [phone], he picked up medicine on Friday, no needles. He then said he thought he could not take it because it was not refrigerated. I encouraged him to pick up needles and start taking it

## 2023-01-28 ENCOUNTER — Other Ambulatory Visit (HOSPITAL_COMMUNITY): Payer: Self-pay

## 2023-02-04 NOTE — Telephone Encounter (Signed)
Submitted application for FARXIGA to AZ[employer]ME for patient assistance.   Phone: [phone]  

## 2023-02-10 NOTE — Telephone Encounter (Signed)
Received notification from AZ&ME regarding patient assistance DENIAL for FARXIGA.   Reason: Patient may be eligible for [place] [employer]. If denied for medicaid, see appeal decisions on AZ&ME letter.  [phone]

## 2023-02-12 ENCOUNTER — Other Ambulatory Visit: Payer: Self-pay

## 2023-02-12 DIAGNOSIS — I214 Non-ST elevation (NSTEMI) myocardial infarction: Secondary | ICD-10-CM

## 2023-02-13 MED ORDER — ATORVASTATIN CALCIUM 40 MG PO TABS: ORAL_TABLET | ORAL | 0 refills | Status: DC

## 2023-02-18 ENCOUNTER — Other Ambulatory Visit (HOSPITAL_COMMUNITY): Payer: Self-pay

## 2023-02-27 ENCOUNTER — Other Ambulatory Visit (HOSPITAL_COMMUNITY): Payer: Self-pay

## 2023-02-27 ENCOUNTER — Telehealth: Payer: Self-pay | Admitting: *Deleted

## 2023-02-27 ENCOUNTER — Other Ambulatory Visit: Payer: Self-pay

## 2023-02-27 NOTE — Telephone Encounter (Signed)
Victoza that was picked up in October was for patients novo nordisk order. This was a 3 month supply however patient didn't refrigerate supply and was told to not take medication.  Spoke with [facility] pharmacy. They are able to fill a ONE TIME supply for patient. Please send a one month supply of Victoza to pharmacy.   I will reach out to patient once his next supply of Victoza comes in from novo nordisk.

## 2023-02-27 NOTE — Telephone Encounter (Signed)
Patient's wife her to pick up samples of Victoza.  Was told to come a while back.  No samples in refrigerator foe patient.

## 2023-02-28 ENCOUNTER — Other Ambulatory Visit: Payer: Self-pay | Admitting: Student

## 2023-02-28 ENCOUNTER — Other Ambulatory Visit: Payer: Self-pay

## 2023-02-28 ENCOUNTER — Other Ambulatory Visit (HOSPITAL_COMMUNITY): Payer: Self-pay

## 2023-02-28 DIAGNOSIS — E119 Type 2 diabetes mellitus without complications: Secondary | ICD-10-CM

## 2023-02-28 MED ORDER — LIRAGLUTIDE 18 MG/3ML ~~LOC~~ SOPN
PEN_INJECTOR | SUBCUTANEOUS | 0 refills | Status: DC
  Filled 2023-02-28: qty 4.9, 28d supply, fill #0

## 2023-03-11 ENCOUNTER — Other Ambulatory Visit: Payer: Self-pay

## 2023-03-19 ENCOUNTER — Encounter: Payer: Self-pay | Admitting: Student

## 2023-03-30 ENCOUNTER — Other Ambulatory Visit: Payer: Self-pay | Admitting: Student

## 2023-03-30 DIAGNOSIS — L409 Psoriasis, unspecified: Secondary | ICD-10-CM

## 2023-04-04 ENCOUNTER — Other Ambulatory Visit (HOSPITAL_COMMUNITY): Payer: Self-pay

## 2023-04-04 ENCOUNTER — Telehealth: Payer: Self-pay

## 2023-04-04 NOTE — Telephone Encounter (Signed)
I called and lvm for patient to give [place] a call back to schedule a fu appointment in order for him to continue to get his medications refilled.

## 2023-04-04 NOTE — Telephone Encounter (Signed)
Patient called requesting a rx refill for farxiga, I called and spoke with the pharmacists Rosanne Ashing he stated farxiga is no longer covered under the IM program and is requesting that you reach out to the patient for financial assistance.

## 2023-04-10 ENCOUNTER — Other Ambulatory Visit (HOSPITAL_COMMUNITY): Payer: Self-pay

## 2023-04-22 ENCOUNTER — Encounter: Payer: Self-pay | Admitting: Student

## 2023-05-07 ENCOUNTER — Encounter: Payer: Self-pay | Admitting: Student

## 2023-05-08 ENCOUNTER — Other Ambulatory Visit (HOSPITAL_COMMUNITY): Payer: Self-pay

## 2023-05-08 ENCOUNTER — Other Ambulatory Visit: Payer: Self-pay | Admitting: Student

## 2023-05-08 DIAGNOSIS — I10 Essential (primary) hypertension: Secondary | ICD-10-CM

## 2023-05-08 MED ORDER — LISINOPRIL-HYDROCHLOROTHIAZIDE 20-25 MG PO TABS
1.0000 | ORAL_TABLET | Freq: Every day | ORAL | 3 refills | Status: DC
  Filled 2023-05-08: qty 30, 30d supply, fill #0
  Filled 2023-06-16 – 2023-07-23 (×2): qty 30, 30d supply, fill #1

## 2023-05-08 NOTE — Telephone Encounter (Signed)
Medication sent to pharmacy

## 2023-05-12 NOTE — Progress Notes (Signed)
 Established Patient Office Visit  Subjective   Patient ID: Kurt Mathis, male    DOB: 1960-07-18  Age: 63 y.o. MRN: [id number]  Chief Complaint  Patient presents with   Medication Refill   Diabetes   Rash    Kurt Mathis is a 63 y.o. who presents to the clinic for a 3 month follow up of T2DM and HTN. Please see problem based assessment and plan for additional details.  Patient Active Problem List   Diagnosis Date Noted   Psoriasis 05/23/2022   Neuropathy 07/24/2016   Cardiomyopathy, ischemic    CAD (multiple vessel) s/p CABG x5    Dyslipidemia    Essential hypertension    NSTEMI (non-ST elevated myocardial infarction) (HCC) 12/23/2015   Diabetes mellitus type 2 in nonobese (HCC) 12/23/2015      Objective:     BP (!) 143/73 (BP Location: Right Arm, Patient Position: Sitting, Cuff Size: Small)   Pulse 67   Temp (!) 97.3 F (36.3 C) (Oral)   Ht 5' 11 (1.803 m)   Wt 157 lb 1.6 oz (71.3 kg)   SpO2 99%   BMI 21.91 kg/m  BP Readings from Last 3 Encounters:  05/13/23 (!) 143/73  12/23/22 (!) 148/69  05/22/22 129/68   Wt Readings from Last 3 Encounters:  05/13/23 157 lb 1.6 oz (71.3 kg)  12/23/22 158 lb 11.2 oz (72 kg)  05/22/22 160 lb 1.6 oz (72.6 kg)      Physical Exam Vitals reviewed.  Constitutional:      General: He is not in acute distress.    Appearance: He is normal weight. He is not ill-appearing, toxic-appearing or diaphoretic.  Cardiovascular:     Rate and Rhythm: Normal rate and regular rhythm.     Heart sounds: Normal heart sounds. No murmur heard. Pulmonary:     Effort: Pulmonary effort is normal. No respiratory distress.     Breath sounds: Normal breath sounds. No stridor. No wheezing, rhonchi or rales.  Abdominal:     General: Abdomen is flat. There is no distension.     Palpations: Abdomen is soft.     Tenderness: There is no abdominal tenderness.  Musculoskeletal:     Right lower leg: No edema.     Left lower leg: No  edema.  Skin:    General: Skin is warm and dry.     Findings: Rash (Numerous erythematous lesions with scaling present on left olecranon process and bilateral patellas) present.  Neurological:     Mental Status: He is alert.      Results for orders placed or performed in visit on 05/13/23  Glucose, capillary  Result Value Ref Range   Glucose-Capillary 168 (H) 70 - 99 mg/dL  POC Hbg J8R  Result Value Ref Range   Hemoglobin A1C 8.7 (A) 4.0 - 5.6 %   HbA1c POC (<> result, manual entry)     HbA1c, POC (prediabetic range)     HbA1c, POC (controlled diabetic range)      Last metabolic panel Lab Results  Component Value Date   GLUCOSE 173 (H) 05/22/2022   NA 141 05/22/2022   K 4.5 05/22/2022   CL 101 05/22/2022   CO2 21 05/22/2022   BUN 25 05/22/2022   CREATININE 0.90 05/22/2022   EGFR 97 05/22/2022   CALCIUM  9.8 05/22/2022   PROT 7.6 11/05/2019   ALBUMIN  4.2 11/05/2019   BILITOT 1.1 11/05/2019   ALKPHOS 52 11/05/2019   AST 24 11/05/2019   ALT 24  11/05/2019   ANIONGAP 14 11/05/2019   Last lipids Lab Results  Component Value Date   CHOL 122 10/24/2021   HDL 23 (L) 10/24/2021   LDLCALC 62 10/24/2021   TRIG 225 (H) 10/24/2021   CHOLHDL 5.3 (H) 10/24/2021   Last hemoglobin A1c Lab Results  Component Value Date   HGBA1C 8.7 (A) 05/13/2023      The ASCVD Risk score (Arnett DK, et al., 2019) failed to calculate for the following reasons:   Risk score cannot be calculated because patient has a medical history suggesting prior/existing ASCVD    Assessment & Plan:   Problem List Items Addressed This Visit       Cardiovascular and Mediastinum   Essential hypertension   HTN Patient presents with a history of hypertension with a blood pressure today of 144/68 with a repeat of 143/73. He is on a regimen of lopressor  25 mg and lisinopril -hydrochlorothiazide   20-25 mg.  Prior BMP in 05/2022, SCr was 0.90 .  Patient denies symptoms of hypotension at home.  They check  their blood pressure at home, he does not have a log of his BP. Last dose of medications was last night.   Plan: -Continue current regimen of lopressor  25 mg and lisinopril -hydrochlorothiazide   20-25 mg -BMP today         Endocrine   Diabetes mellitus type 2 in nonobese [facility]) - Primary   Patient presents with a history of uncontrolled T2DM with a prior A1c of 10.9 in September.  Patient's A1c today is 8.7.  They are on a regimen of farxiga  10mg  and metformin  2,000mg  daily. It appears his Victoza  prescription expired in November.  Patient denies  hypoglycemia.    Plan: -Continue regimen of: farxiga  10 mg, metformin  2,000 mg, start :will restart victoza  today -A1c today -Foot exam performed       Relevant Medications   liraglutide  (VICTOZA ) 18 MG/3ML SOPN   Other Relevant Orders   POC Hbg A1C (Completed)   Basic metabolic panel     Nervous and Auditory   Neuropathy   Not addressed at this visit, will need further information for classification of diabetic neuropathy versus radiculopathy.        Musculoskeletal and Integument   Psoriasis   Patient has a history of psoriasis on his bilateral olecranon processes.  Resents today with psoriatic lesions on his bilateral patella's and L olecranon process. He was last seen in February of 2024 with this complaint and was provided clobetasol  which improved his symptoms. Plan:  -Refilled clobetasol  0.05%         Relevant Medications   clobetasol  ointment (TEMOVATE ) 0.05 %     Return in about 3 months (around 08/10/2023) for T2DM.    Damien Lease, DO

## 2023-05-13 ENCOUNTER — Encounter: Payer: Self-pay | Admitting: Student

## 2023-05-13 ENCOUNTER — Other Ambulatory Visit (HOSPITAL_COMMUNITY): Payer: Self-pay

## 2023-05-13 ENCOUNTER — Other Ambulatory Visit: Payer: Self-pay | Admitting: Student

## 2023-05-13 ENCOUNTER — Ambulatory Visit: Payer: Self-pay | Admitting: Student

## 2023-05-13 VITALS — BP 143/73 | HR 67 | Temp 97.3°F | Ht 71.0 in | Wt 157.1 lb

## 2023-05-13 DIAGNOSIS — E119 Type 2 diabetes mellitus without complications: Secondary | ICD-10-CM

## 2023-05-13 DIAGNOSIS — Z7984 Long term (current) use of oral hypoglycemic drugs: Secondary | ICD-10-CM

## 2023-05-13 DIAGNOSIS — G629 Polyneuropathy, unspecified: Secondary | ICD-10-CM

## 2023-05-13 DIAGNOSIS — L409 Psoriasis, unspecified: Secondary | ICD-10-CM

## 2023-05-13 DIAGNOSIS — I214 Non-ST elevation (NSTEMI) myocardial infarction: Secondary | ICD-10-CM

## 2023-05-13 DIAGNOSIS — I10 Essential (primary) hypertension: Secondary | ICD-10-CM

## 2023-05-13 DIAGNOSIS — Z7985 Long-term (current) use of injectable non-insulin antidiabetic drugs: Secondary | ICD-10-CM

## 2023-05-13 LAB — POCT GLYCOSYLATED HEMOGLOBIN (HGB A1C): Hemoglobin A1C: 8.7 % — AB (ref 4.0–5.6)

## 2023-05-13 LAB — GLUCOSE, CAPILLARY: Glucose-Capillary: 168 mg/dL — ABNORMAL HIGH (ref 70–99)

## 2023-05-13 MED ORDER — CLOBETASOL PROPIONATE 0.05 % EX OINT: TOPICAL_OINTMENT | CUTANEOUS | 11 refills | Status: DC

## 2023-05-13 MED ORDER — TRUE METRIX METER W/DEVICE KIT
1.0000 | PACK | Freq: Every morning | 3 refills | Status: AC
  Filled 2023-05-13 – 2023-06-16 (×2): qty 1, 1d supply, fill #0
  Filled 2023-06-17: qty 1, 30d supply, fill #0

## 2023-05-13 MED ORDER — LIRAGLUTIDE 18 MG/3ML ~~LOC~~ SOPN: PEN_INJECTOR | SUBCUTANEOUS | 3 refills | Status: DC

## 2023-05-13 NOTE — Assessment & Plan Note (Signed)
Not addressed at this visit, will need further information for classification of diabetic neuropathy versus radiculopathy.

## 2023-05-13 NOTE — Patient Instructions (Signed)
 Thank you, [name] for allowing us  to provide your care today. Today we discussed diabetes, blood pressure, and psoriasis.    I have ordered the following labs for you:   Lab Orders         Glucose, capillary         Basic metabolic panel         POC Hbg A1C       I have ordered the following medication/changed the following medications:   Stop the following medications: Medications Discontinued During This Encounter  Medication Reason   liraglutide  (VICTOZA ) 18 MG/3ML SOPN Reorder   clobetasol  ointment (TEMOVATE ) 0.05 % Reorder     Start the following medications: Meds ordered this encounter  Medications   liraglutide  (VICTOZA ) 18 MG/3ML SOPN    Sig: Inject 0.6 mg into the skin daily for 7 days, THEN 1.2 mg daily for 21 days. Continue taking the 1.2 mg dose daily after you complete the week of 0.6mg  dosing.    Dispense:  4.9 mL    Refill:  3    IM PROGRAM   clobetasol  ointment (TEMOVATE ) 0.05 %    Sig: APPLY  1 APPLICATION OINTMENT TOPICALLY TWICE A DAY    Dispense:  30 g    Refill:  11     Follow up: 3 months    Remember: To call if you need refills on your medications!   Should you have any questions or concerns please call the internal medicine clinic at [phone].     Please note that our late policy has changed.  If you are more than 15 minutes late to your appointment, you may be asked to reschedule your appointment.  Dr. Kandis, D.O. [facility]

## 2023-05-13 NOTE — Assessment & Plan Note (Signed)
 Patient presents with a history of uncontrolled T2DM with a prior A1c of 10.9 in September.  Patient's A1c today is 8.7.  They are on a regimen of farxiga  10mg  and metformin  2,000mg  daily. It appears his Victoza  prescription expired in November.  Patient denies  hypoglycemia.    Plan: -Continue regimen of: farxiga  10 mg, metformin  2,000 mg, start :will restart victoza  today -A1c today -Foot exam performed

## 2023-05-13 NOTE — Assessment & Plan Note (Signed)
 HTN Patient presents with a history of hypertension with a blood pressure today of 144/68 with a repeat of 143/73. He is on a regimen of lopressor  25 mg and lisinopril -hydrochlorothiazide   20-25 mg.  Prior BMP in 05/2022, SCr was 0.90 .  Patient denies symptoms of hypotension at home.  They check their blood pressure at home, he does not have a log of his BP. Last dose of medications was last night.   Plan: -Continue current regimen of lopressor  25 mg and lisinopril -hydrochlorothiazide   20-25 mg -BMP today

## 2023-05-13 NOTE — Assessment & Plan Note (Addendum)
 Patient has a history of psoriasis on his bilateral olecranon processes.  Resents today with psoriatic lesions on his bilateral patella's and L olecranon process. He was last seen in February of 2024 with this complaint and was provided clobetasol  which improved his symptoms. Plan:  -Refilled clobetasol  0.05%

## 2023-05-14 ENCOUNTER — Other Ambulatory Visit (HOSPITAL_COMMUNITY): Payer: Self-pay

## 2023-05-14 ENCOUNTER — Other Ambulatory Visit: Payer: Self-pay | Admitting: Student

## 2023-05-14 ENCOUNTER — Telehealth: Payer: Self-pay

## 2023-05-14 DIAGNOSIS — E119 Type 2 diabetes mellitus without complications: Secondary | ICD-10-CM

## 2023-05-14 DIAGNOSIS — I214 Non-ST elevation (NSTEMI) myocardial infarction: Secondary | ICD-10-CM

## 2023-05-14 LAB — BASIC METABOLIC PANEL
BUN/Creatinine Ratio: 29 — ABNORMAL HIGH (ref 10–24)
BUN: 25 mg/dL (ref 8–27)
CO2: 22 mmol/L (ref 20–29)
Calcium: 9.2 mg/dL (ref 8.6–10.2)
Chloride: 103 mmol/L (ref 96–106)
Creatinine, Ser: 0.85 mg/dL (ref 0.76–1.27)
Glucose: 207 mg/dL — ABNORMAL HIGH (ref 70–99)
Potassium: 4.5 mmol/L (ref 3.5–5.2)
Sodium: 141 mmol/L (ref 134–144)
eGFR: 98 mL/min/{1.73_m2} (ref >59)

## 2023-05-14 MED ORDER — FREESTYLE LANCETS MISC
12 refills | Status: AC
  Filled 2023-05-14: qty 100, 33d supply, fill #0
  Filled 2023-06-16: qty 100, 100d supply, fill #0
  Filled 2023-06-17: qty 100, fill #0
  Filled 2023-06-17: qty 100, 34d supply, fill #0

## 2023-05-14 MED ORDER — METFORMIN HCL 1000 MG PO TABS: 1000.0000 mg | ORAL_TABLET | Freq: Two times a day (BID) | ORAL | 2 refills | Status: DC

## 2023-05-14 MED ORDER — ATORVASTATIN CALCIUM 40 MG PO TABS: ORAL_TABLET | ORAL | 3 refills | Status: AC

## 2023-05-14 MED ORDER — METOPROLOL TARTRATE 25 MG PO TABS: ORAL_TABLET | ORAL | 0 refills | Status: DC

## 2023-05-14 MED ORDER — GLUCOSE BLOOD VI STRP
1.0000 | ORAL_STRIP | Freq: Three times a day (TID) | 0 refills | Status: AC
  Filled 2023-05-14: qty 100, 33d supply, fill #0
  Filled 2023-05-15 – 2023-07-28 (×5): qty 100, 34d supply, fill #0

## 2023-05-14 NOTE — Telephone Encounter (Addendum)
 Providing patient 2 boxes of Victoza  that were left over in fridge from a previous novo nordisk order.   Will follow up with [employer] on patients next actual shipment from company.  Medication Samples have been provided to the patient.  Drug name: VICTOZA        Strength: 18MG Kurt Mathis        Qty: 2 BOXES (4 PENS)  LOT: [id number]  Exp.Date: 06/05/25  Dosing instructions: INJECT 1.2MG  ONCE DAILY (2 MONTH SUPPLY.   Kurt Mathis 10:30 AM 05/14/2023

## 2023-05-14 NOTE — Telephone Encounter (Signed)
 Call to patient after speaking with [facility].  Has samples of the Victoza  available.  Patient will co,e by tomorrow to pick up.  Encouraged to complete forms for Medicaid.

## 2023-05-14 NOTE — Telephone Encounter (Signed)
 Call from patient requesting refills of meds to be sent to the [facility] on [address].  Refills sent.  Asking about getting sample of Victoza .  Will forward to Camile E with the Pharmacy  to check on samples.

## 2023-05-14 NOTE — Telephone Encounter (Signed)
 Pt states  liraglutide  (VICTOZA ) 18 MG/3ML SOPN cost $400.00, unable to pick this medication. Requesting this medication to be sent to [facility]

## 2023-05-15 ENCOUNTER — Other Ambulatory Visit (HOSPITAL_COMMUNITY): Payer: Self-pay

## 2023-05-21 NOTE — Progress Notes (Signed)
 Internal Medicine Clinic Attending  Case discussed with the resident at the time of the visit.  We reviewed the resident's history and exam and pertinent patient test results.  I agree with the assessment, diagnosis, and plan of care documented in the resident's note.

## 2023-05-26 ENCOUNTER — Other Ambulatory Visit (HOSPITAL_COMMUNITY): Payer: Self-pay

## 2023-05-26 NOTE — Telephone Encounter (Signed)
Pt picked up 2 boxes of Victoza.

## 2023-05-27 ENCOUNTER — Other Ambulatory Visit (HOSPITAL_COMMUNITY): Payer: Self-pay

## 2023-06-16 ENCOUNTER — Other Ambulatory Visit (HOSPITAL_COMMUNITY): Payer: Self-pay

## 2023-06-16 ENCOUNTER — Other Ambulatory Visit: Payer: Self-pay

## 2023-06-16 DIAGNOSIS — I10 Essential (primary) hypertension: Secondary | ICD-10-CM

## 2023-06-16 DIAGNOSIS — E119 Type 2 diabetes mellitus without complications: Secondary | ICD-10-CM

## 2023-06-16 MED ORDER — DAPAGLIFLOZIN PROPANEDIOL 10 MG PO TABS
10.0000 mg | ORAL_TABLET | Freq: Every day | ORAL | 11 refills | Status: AC
  Filled 2023-06-16: qty 30, 30d supply, fill #0

## 2023-06-16 NOTE — Telephone Encounter (Signed)
 Medication sent to pharmacy

## 2023-06-17 ENCOUNTER — Other Ambulatory Visit (HOSPITAL_COMMUNITY): Payer: Self-pay

## 2023-06-17 ENCOUNTER — Other Ambulatory Visit: Payer: Self-pay

## 2023-06-26 ENCOUNTER — Other Ambulatory Visit (HOSPITAL_COMMUNITY): Payer: Self-pay

## 2023-06-27 ENCOUNTER — Other Ambulatory Visit: Payer: Self-pay

## 2023-07-23 ENCOUNTER — Other Ambulatory Visit (HOSPITAL_COMMUNITY): Payer: Self-pay

## 2023-07-28 ENCOUNTER — Other Ambulatory Visit (HOSPITAL_COMMUNITY): Payer: Self-pay

## 2023-07-29 ENCOUNTER — Other Ambulatory Visit (HOSPITAL_COMMUNITY): Payer: Self-pay

## 2023-08-11 ENCOUNTER — Other Ambulatory Visit (HOSPITAL_COMMUNITY): Payer: Self-pay

## 2023-08-22 ENCOUNTER — Telehealth: Payer: Self-pay

## 2023-08-22 NOTE — Telephone Encounter (Signed)
 Victoza  medication discontinued on patient assistance program.   Victoza  is also no longer available at [facility] pharmacy under their free meds [facility] program).   Trulicity is available on DOH program at [facility] pharmacy.   Ozempic is available with Novo Nordisk patient assistance company.   Please let me know how you'd like to move forward with medication changes.

## 2023-09-02 NOTE — Telephone Encounter (Signed)
 Patient Kurt Mathis is calling on the status of his medication: Victoza  or medication he will be taking.  He stated if does not answer leave a message.  Please advise.

## 2023-09-03 ENCOUNTER — Other Ambulatory Visit: Payer: Self-pay

## 2023-09-03 ENCOUNTER — Other Ambulatory Visit: Payer: Self-pay | Admitting: Student

## 2023-09-03 MED ORDER — TRULICITY 0.75 MG/0.5ML ~~LOC~~ SOAJ
0.7500 mg | SUBCUTANEOUS | 0 refills | Status: DC
  Filled 2023-09-03: qty 2, 28d supply, fill #0

## 2023-09-03 NOTE — Progress Notes (Signed)
 Spoke with patient on medication changes. Will discontinue Victoza  and start Trulicity  0.75 mg weekly injection. He understands that Trulicity  is a ONCE weekly injection. We reviewed the side effects as well.

## 2023-09-04 ENCOUNTER — Telehealth: Payer: Self-pay | Admitting: *Deleted

## 2023-09-04 NOTE — Telephone Encounter (Signed)
 I called pt - pt informed Trulicity  rx was sent to [facility].

## 2023-09-04 NOTE — Telephone Encounter (Signed)
 Copied from CRM [id number]. Topic: Clinical - Medication Question >> Sep 04, 2023  9:15 AM Tisa Forester wrote: Reason for CRM: patient spoke with his provider yesterday and stated the provider said he can pick up his new medication its for his sugar and diabetes , patient question is  where do he pick the new medication at is the pharmacy or the [facility] internal medicine center and if the pharmacy which pharmacy   patient call back number  [phone]

## 2023-09-05 ENCOUNTER — Other Ambulatory Visit (HOSPITAL_COMMUNITY): Payer: Self-pay

## 2023-09-08 ENCOUNTER — Other Ambulatory Visit: Payer: Self-pay

## 2023-09-10 ENCOUNTER — Ambulatory Visit: Payer: Self-pay

## 2023-09-10 ENCOUNTER — Other Ambulatory Visit (HOSPITAL_COMMUNITY): Payer: Self-pay

## 2023-09-10 NOTE — Telephone Encounter (Signed)
 I called pt who stated he has already talked to someone; no fuurther questions.

## 2023-09-10 NOTE — Telephone Encounter (Signed)
 FYI Only or Action Required?: FYI only for provider  Patient was last seen in primary care on 05/13/23 with Aurora Lees, DO . Called Nurse Triage reporting Medication Problem. Symptoms began None. Interventions attempted: Nothing. Symptoms are: No symptoms.  Triage Disposition: Information or Advice Only Call  Patient/caregiver understands and will follow disposition?: Yes        Copied from CRM [id number]. Topic: Clinical - Medication Question >> Sep 10, 2023  8:49 AM Lenon Radar A wrote: Reason for CRM: Patient called in regarding medication Dulaglutide  (TRULICITY ) 0.75 MG/0.5ML SOAJ. Patient wants to know how the medication is dispensed and once used if there is anymore let inside injection. Please contact patient at [phone]. Reason for Disposition  Caller has medicine question only, adult not sick, AND triager answers question  Answer Assessment - Initial Assessment Questions 1. NAME of MEDICINE: "What medicine(s) are you calling about?"     Trulicity  2. QUESTION: "What is your question?" (e.g., double dose of medicine, side effect)     Is each pen more than one dose? 3. PRESCRIBER: "Who prescribed the medicine?" Reason: if prescribed by specialist, call should be referred to that group.     PCP 4. SYMPTOMS: "Do you have any symptoms?" If Yes, ask: "What symptoms are you having?"  "How bad are the symptoms (e.g., mild, moderate, severe)     None   This RN reviewed instructions and quantity in each pen and how much each weekly dose should be. Pt verbalized understanding and agrees to plan.  Protocols used: Medication Question Call-A-AH

## 2023-10-03 ENCOUNTER — Other Ambulatory Visit: Payer: Self-pay

## 2023-10-03 ENCOUNTER — Other Ambulatory Visit: Payer: Self-pay | Admitting: Student

## 2023-10-03 MED ORDER — TRULICITY 0.75 MG/0.5ML ~~LOC~~ SOAJ
0.7500 mg | SUBCUTANEOUS | 0 refills | Status: AC
Start: 2023-10-03 — End: ?
  Filled 2023-10-03 – 2023-10-13 (×3): qty 2, 28d supply, fill #0

## 2023-10-03 NOTE — Telephone Encounter (Signed)
 Copied from CRM [id number]. Topic: Clinical - Medication Refill >> Oct 03, 2023  9:01 AM Alfonso ORN wrote: Medication: Dulaglutide  (TRULICITY ) 0.75 MG/0.5ML SOAJ  Has the patient contacted their pharmacy? No (Agent: If no, request that the patient contact the pharmacy for the refill. If patient does not wish to contact the pharmacy document the reason why and proceed with request.) (Agent: If yes, when and what did the pharmacy advise?)  This is the patient's preferred pharmacy:   [facility] [address] [place] [address] Phone: [phone] Fax: [phone]  Is this the correct pharmacy for this prescription? Yes If no, delete pharmacy and type the correct one.   Has the prescription been filled recently? Yes  Is the patient out of the medication? Yes  Has the patient been seen for an appointment in the last year OR does the patient have an upcoming appointment? Yes  Can we respond through MyChart? Yes  Agent: Please be advised that Rx refills may take up to 3 business days. We ask that you follow-up with your pharmacy.

## 2023-10-13 ENCOUNTER — Telehealth: Payer: Self-pay | Admitting: *Deleted

## 2023-10-13 ENCOUNTER — Other Ambulatory Visit: Payer: Self-pay | Admitting: Student

## 2023-10-13 ENCOUNTER — Other Ambulatory Visit: Payer: Self-pay

## 2023-10-13 NOTE — Telephone Encounter (Signed)
 Copied from CRM [id number]. Topic: Clinical - Medication Refill >> Oct 13, 2023  9:45 AM Zane F wrote: Patient stated that he is calling in after speaking with his pharmacy about not having any refills for the following prescription. Please refill.    Medication: Dulaglutide  (TRULICITY ) 0.75 MG/0.5ML SOAJ  Has the patient contacted their pharmacy? Yes   This is the patient's preferred pharmacy:    [facility] - [facility] [address], [address] [place] [address] Phone: [phone] Fax: [phone]  Is this the correct pharmacy for this prescription? Yes   Has the prescription been filled recently? No  Is the patient out of the medication? Yes  Has the patient been seen for an appointment in the last year OR does the patient have an upcoming appointment? Yes  Can we respond through MyChart? No, Patient would like to be called once the prescription has been refilled.   Agent: Please be advised that Rx refills may take up to 3 business days. We ask that you follow-up with your pharmacy.

## 2023-10-13 NOTE — Telephone Encounter (Signed)
 Call to Pharmacy.  Patient has available refills.  Patient was called and informed that he has refill awaiting pick up.   Patient was called and informed of. Copied from CRM [id number]. Topic: Clinical - Medication Refill >> Oct 13, 2023  9:45 AM Zane F wrote: Patient stated that he is calling in after speaking with his pharmacy about not having any refills for the following prescription. Please refill.    Medication: Dulaglutide  (TRULICITY ) 0.75 MG/0.5ML SOAJ  Has the patient contacted their pharmacy? Yes   This is the patient's preferred pharmacy:    [facility] - [facility] [address] [place] [address] Phone: [phone] Fax: [phone]  Is this the correct pharmacy for this prescription? Yes   Has the prescription been filled recently? No  Is the patient out of the medication? Yes  Has the patient been seen for an appointment in the last year OR does the patient have an upcoming appointment? Yes  Can we respond through MyChart? No, Patient would like to be called once the prescription has been refilled.   Agent: Please be advised that Rx refills may take up to 3 business days. We ask that you follow-up with your pharmacy. >> Oct 13, 2023  9:50 AM Zane F wrote: Patient never picked up 6/27 prescription. Please ignore additional request.

## 2023-10-14 ENCOUNTER — Other Ambulatory Visit: Payer: Self-pay

## 2023-10-14 MED ORDER — TRULICITY 0.75 MG/0.5ML ~~LOC~~ SOAJ
0.7500 mg | SUBCUTANEOUS | 0 refills | Status: DC
  Filled 2023-11-11: qty 2, 28d supply, fill #0

## 2023-10-16 ENCOUNTER — Other Ambulatory Visit: Payer: Self-pay

## 2023-10-17 ENCOUNTER — Other Ambulatory Visit: Payer: Self-pay | Admitting: Student

## 2023-10-17 ENCOUNTER — Other Ambulatory Visit: Payer: Self-pay

## 2023-10-17 DIAGNOSIS — I10 Essential (primary) hypertension: Secondary | ICD-10-CM

## 2023-10-20 ENCOUNTER — Other Ambulatory Visit: Payer: Self-pay

## 2023-11-11 ENCOUNTER — Other Ambulatory Visit: Payer: Self-pay | Admitting: Student

## 2023-11-11 ENCOUNTER — Other Ambulatory Visit: Payer: Self-pay

## 2023-11-11 DIAGNOSIS — I214 Non-ST elevation (NSTEMI) myocardial infarction: Secondary | ICD-10-CM

## 2023-11-11 NOTE — Telephone Encounter (Unsigned)
 Copied from CRM #[id number]. Topic: Clinical - Medication Refill >> Nov 11, 2023  9:06 AM Carmell R wrote: Medication: Dulaglutide  (TRULICITY ) 0.75 MG/0.5ML SOAJ  Has the patient contacted their pharmacy? No, but there are no refills left.  This is the patient's preferred pharmacy:  [facility] - [facility] [address], [address] [place] [address] Phone: [phone] Fax: [phone]  Is this the correct pharmacy for this prescription? Yes  Has the prescription been filled recently? Yes  Is the patient out of the medication? Yes, Today he is taking his last injection  Has the patient been seen for an appointment in the last year OR does the patient have an upcoming appointment? Yes  Can we respond through MyChart? No  Agent: Please be advised that Rx refills may take up to 3 business days. We ask that you follow-up with your pharmacy.

## 2023-11-14 ENCOUNTER — Other Ambulatory Visit: Payer: Self-pay | Admitting: Student

## 2023-11-14 DIAGNOSIS — I10 Essential (primary) hypertension: Secondary | ICD-10-CM

## 2023-11-14 NOTE — Telephone Encounter (Signed)
 Medication sent to pharmacy

## 2023-11-17 ENCOUNTER — Other Ambulatory Visit: Payer: Self-pay | Admitting: Student

## 2023-11-17 ENCOUNTER — Other Ambulatory Visit: Payer: Self-pay

## 2023-11-17 DIAGNOSIS — I10 Essential (primary) hypertension: Secondary | ICD-10-CM

## 2023-11-19 ENCOUNTER — Other Ambulatory Visit: Payer: Self-pay

## 2023-12-10 ENCOUNTER — Other Ambulatory Visit: Payer: Self-pay | Admitting: Student

## 2023-12-10 ENCOUNTER — Other Ambulatory Visit: Payer: Self-pay

## 2023-12-10 MED ORDER — TRULICITY 0.75 MG/0.5ML ~~LOC~~ SOAJ
0.7500 mg | SUBCUTANEOUS | 0 refills | Status: DC
  Filled 2023-12-10: qty 2, 28d supply, fill #0

## 2023-12-10 NOTE — Telephone Encounter (Signed)
 Copied from CRM #[id number]. Topic: Clinical - Medication Refill >> Dec 10, 2023  9:57 AM Carmell R wrote: Medication: Dulaglutide  (TRULICITY ) 0.75 MG/0.5ML SOAJ  Has the patient contacted their pharmacy? Yes, There are no more refills.  This is the patient's preferred pharmacy:  [facility] - [facility] [address] [place] [address] Phone: [phone] Fax: [phone]  Is this the correct pharmacy for this prescription? Yes  Has the prescription been filled recently? Yes  Is the patient out of the medication? Yes  Has the patient been seen for an appointment in the last year OR does the patient have an upcoming appointment? Yes  Can we respond through MyChart? No  Agent: Please be advised that Rx refills may take up to 3 business days. We ask that you follow-up with your pharmacy.

## 2023-12-15 ENCOUNTER — Other Ambulatory Visit: Payer: Self-pay

## 2023-12-15 ENCOUNTER — Other Ambulatory Visit: Payer: Self-pay | Admitting: Student

## 2023-12-15 DIAGNOSIS — I214 Non-ST elevation (NSTEMI) myocardial infarction: Secondary | ICD-10-CM

## 2024-01-06 ENCOUNTER — Other Ambulatory Visit: Payer: Self-pay | Admitting: Student

## 2024-01-06 ENCOUNTER — Other Ambulatory Visit: Payer: Self-pay

## 2024-01-06 MED ORDER — TRULICITY 0.75 MG/0.5ML ~~LOC~~ SOAJ
0.7500 mg | SUBCUTANEOUS | 3 refills | Status: DC
  Filled 2024-01-06: qty 2, 28d supply, fill #0
  Filled 2024-02-10: qty 2, 28d supply, fill #1

## 2024-01-07 ENCOUNTER — Other Ambulatory Visit: Payer: Self-pay

## 2024-01-14 ENCOUNTER — Other Ambulatory Visit: Payer: Self-pay

## 2024-02-03 ENCOUNTER — Other Ambulatory Visit: Payer: Self-pay

## 2024-02-03 ENCOUNTER — Other Ambulatory Visit: Payer: Self-pay | Admitting: Student

## 2024-02-03 NOTE — Telephone Encounter (Unsigned)
 Copied from CRM [id number]. Topic: Clinical - Medication Refill >> Feb 03, 2024  9:03 AM Alfonso ORN wrote: Medication: Dulaglutide  (TRULICITY ) 0.75 MG/0.5ML SOAJ  Has the patient contacted their pharmacy? No (Agent: If no, request that the patient contact the pharmacy for the refill. If patient does not wish to contact the pharmacy document the reason why and proceed with request.) (Agent: If yes, when and what did the pharmacy advise?)  This is the patient's preferred pharmacy:   [facility] - [facility] [address] [place] [address] Phone: [phone] Fax: [phone]  Is this the correct pharmacy for this prescription? Yes If no, delete pharmacy and type the correct one.   Has the prescription been filled recently? Yes  Is the patient out of the medication? Yes  Has the patient been seen for an appointment in the last year OR does the patient have an upcoming appointment? Yes  Can we respond through MyChart? {yes/no:[id number]}  Agent: Please be advised that Rx refills may take up to 3 business days. We ask that you follow-up with your pharmacy.

## 2024-02-10 ENCOUNTER — Other Ambulatory Visit: Payer: Self-pay

## 2024-02-16 ENCOUNTER — Other Ambulatory Visit: Payer: Self-pay

## 2024-02-17 ENCOUNTER — Other Ambulatory Visit: Payer: Self-pay

## 2024-02-17 DIAGNOSIS — L409 Psoriasis, unspecified: Secondary | ICD-10-CM

## 2024-02-19 ENCOUNTER — Other Ambulatory Visit: Payer: Self-pay

## 2024-02-19 MED ORDER — CLOBETASOL PROPIONATE 0.05 % EX OINT
TOPICAL_OINTMENT | CUTANEOUS | 11 refills | Status: AC
Start: 2024-02-19 — End: ?

## 2024-02-20 ENCOUNTER — Other Ambulatory Visit: Payer: Self-pay | Admitting: Student

## 2024-02-20 DIAGNOSIS — E119 Type 2 diabetes mellitus without complications: Secondary | ICD-10-CM

## 2024-02-20 NOTE — Telephone Encounter (Signed)
 Medication sent to pharmacy

## 2024-03-10 ENCOUNTER — Telehealth: Payer: Self-pay | Admitting: Student

## 2024-03-10 NOTE — Telephone Encounter (Unsigned)
 Copied from CRM [id number]. Topic: Clinical - Medication Refill >> Mar 10, 2024  9:11 AM Miquel SAILOR wrote: Medication: Dulaglutide  (TRULICITY ) 0.75 MG/0.5ML SOAJ   Has the patient contacted their pharmacy? Yes (Agent: If no, request that the patient contact the pharmacy for the refill. If patient does not wish to contact the pharmacy document the reason why and proceed with request.) (Agent: If yes, when and what did the pharmacy advise?)  This is the patient's preferred pharmacy:     [facility] - [facility] [address], [address] [place] [address] Phone: [phone] Fax: [phone]  Is this the correct pharmacy for this prescription? Yes If no, delete pharmacy and type the correct one.   Has the prescription been filled recently? Yes  Is the patient out of the medication? Yes  Has the patient been seen for an appointment in the last year OR does the patient have an upcoming appointment? Yes  Can we respond through MyChart? Yes  Agent: Please be advised that Rx refills may take up to 3 business days. We ask that you follow-up with your pharmacy.

## 2024-03-11 ENCOUNTER — Other Ambulatory Visit: Payer: Self-pay

## 2024-03-11 MED ORDER — TRULICITY 0.75 MG/0.5ML ~~LOC~~ SOAJ
0.7500 mg | SUBCUTANEOUS | 3 refills | Status: DC
  Filled 2024-03-11: qty 2, 28d supply, fill #0

## 2024-03-17 ENCOUNTER — Other Ambulatory Visit: Payer: Self-pay

## 2024-03-18 ENCOUNTER — Other Ambulatory Visit: Payer: Self-pay | Admitting: Student

## 2024-03-18 DIAGNOSIS — I10 Essential (primary) hypertension: Secondary | ICD-10-CM

## 2024-03-18 NOTE — Telephone Encounter (Signed)
 Medication sent to pharmacy

## 2024-03-22 ENCOUNTER — Ambulatory Visit: Payer: Self-pay

## 2024-03-30 ENCOUNTER — Other Ambulatory Visit: Payer: Self-pay

## 2024-03-30 ENCOUNTER — Ambulatory Visit: Payer: Self-pay | Admitting: Student

## 2024-03-30 ENCOUNTER — Encounter: Payer: Self-pay | Admitting: Student

## 2024-03-30 VITALS — BP 161/76 | HR 80 | Temp 98.0°F | Ht 71.0 in | Wt 160.6 lb

## 2024-03-30 DIAGNOSIS — I10 Essential (primary) hypertension: Secondary | ICD-10-CM

## 2024-03-30 DIAGNOSIS — Z91138 Patient's unintentional underdosing of medication regimen for other reason: Secondary | ICD-10-CM

## 2024-03-30 DIAGNOSIS — Z79899 Other long term (current) drug therapy: Secondary | ICD-10-CM

## 2024-03-30 DIAGNOSIS — E1165 Type 2 diabetes mellitus with hyperglycemia: Secondary | ICD-10-CM

## 2024-03-30 DIAGNOSIS — E119 Type 2 diabetes mellitus without complications: Secondary | ICD-10-CM

## 2024-03-30 DIAGNOSIS — Z139 Encounter for screening, unspecified: Secondary | ICD-10-CM | POA: Insufficient documentation

## 2024-03-30 DIAGNOSIS — L409 Psoriasis, unspecified: Secondary | ICD-10-CM

## 2024-03-30 DIAGNOSIS — Z7985 Long-term (current) use of injectable non-insulin antidiabetic drugs: Secondary | ICD-10-CM

## 2024-03-30 DIAGNOSIS — Z7984 Long term (current) use of oral hypoglycemic drugs: Secondary | ICD-10-CM

## 2024-03-30 LAB — POCT GLYCOSYLATED HEMOGLOBIN (HGB A1C): HbA1c, POC (controlled diabetic range): 9.7 % — AB (ref 0.0–7.0)

## 2024-03-30 LAB — GLUCOSE, CAPILLARY: Glucose-Capillary: 188 mg/dL — ABNORMAL HIGH (ref 70–99)

## 2024-03-30 MED ORDER — TRULICITY 1.5 MG/0.5ML ~~LOC~~ SOAJ
1.5000 mg | SUBCUTANEOUS | 0 refills | Status: DC
  Filled 2024-03-30: qty 2, 28d supply, fill #0

## 2024-03-30 NOTE — Assessment & Plan Note (Signed)
 Advised to talk with Eric to apply for [employer]

## 2024-03-30 NOTE — Progress Notes (Signed)
 "  Established Patient Office Visit  Subjective   Patient ID: Kurt Mathis, male    DOB: Oct 04, 1960  Age: 63 y.o. MRN: [id number]  Chief Complaint  Patient presents with   Back Pain   Diabetes   Skin Problem    HPI  Kurt Mathis is a 63 yo male with a hx of uncontrolled DM, hypertension, and psoriasis. Last seen in Feb 2025 and here for follow up of the above. Please see the assessment and plan portion of this visit for more information. Today, especifically, we addressed his DM and HTN. Patient will return to clinic to explore other medical conditions     Objective:     BP (!) 161/76 (BP Location: Left Arm, Patient Position: Sitting, Cuff Size: Normal) Comment: hasn't taken bp meds  Pulse 80   Temp 98 F (36.7 C) (Oral)   Ht 5' 11 (1.803 m)   Wt 160 lb 9.6 oz (72.8 kg)   SpO2 97%   BMI 22.40 kg/m   General: Pleasant, well-appearing man laying in bed. No acute distress. Head: Normocephalic. Atraumatic. CV: RRR. No murmurs, rubs, or gallops. No LE edema Pulmonary: Lungs CTAB. Normal effort. No wheezing or rales. Abdominal: Soft, nontender, nondistended. Normal bowel sounds. Extremities: Palpable radial and DP pulses. Normal ROM. Skin: Warm and dry. Healing psoriatic plaques on the extensor surfaces of the upper and lower extremities Neuro: A&Ox3.  Psych: Normal mood and affect    Results for orders placed or performed in visit on 03/30/24  Glucose, capillary  Result Value Ref Range   Glucose-Capillary 188 (H) 70 - 99 mg/dL  POC Hbg J8R  Result Value Ref Range   Hemoglobin A1C     HbA1c POC (<> result, manual entry)     HbA1c, POC (prediabetic range)     HbA1c, POC (controlled diabetic range) 9.7 (A) 0.0 - 7.0 %    Last metabolic panel Lab Results  Component Value Date   GLUCOSE 207 (H) 05/13/2023   NA 141 05/13/2023   K 4.5 05/13/2023   CL 103 05/13/2023   CO2 22 05/13/2023   BUN 25 05/13/2023   CREATININE 0.85 05/13/2023   EGFR 98  05/13/2023   CALCIUM  9.2 05/13/2023   PROT 7.6 11/05/2019   ALBUMIN  4.2 11/05/2019   BILITOT 1.1 11/05/2019   ALKPHOS 52 11/05/2019   AST 24 11/05/2019   ALT 24 11/05/2019   ANIONGAP 14 11/05/2019   Last hemoglobin A1c Lab Results  Component Value Date   HGBA1C 9.7 (A) 03/30/2024      The ASCVD Risk score (Arnett DK, et al., 2019) failed to calculate for the following reasons:   Risk score cannot be calculated because patient has a medical history suggesting prior/existing ASCVD   * - Cholesterol units were assumed    Assessment & Plan:   Problem List Items Addressed This Visit       Cardiovascular and Mediastinum   Essential hypertension   Uncontrolled but has been without medications for two days as he had to stay with children to be driven to appt. Asymptomatic. Continues on Lisinopril -hydrochlorothiazide  20-25 mg Metoprolol  Tartrate 25 mg.  Plan:  - Check BMP today - Will need follow up and advised to bring BP log and take meds before the appointment. He is not amenable for an RN only visit because transportation         Endocrine   Uncontrolled diabetes mellitus with hyperglycemia (HCC) - Primary   Continues to be uncontrolled. Patient was  last seen in office in 05/2023. Previously on Farxiga  10 mg through assistance program, which he lost at the end of last year because the assistance program wanted him to apply to [facility] but patient is undocumented. He has been taking Metformin  1000 mg BID and Trulicity  0.75 mg weekly without side effects. Life is stressful working at the [employer] in [place], and his diet has suffered some. Home BG ~180-200 daily on his home glucose monitor; did not bring it.  He denies catabolic symptoms today. He dislikes taking medications but tries to. No BMP since 05/2023 with normal renal function. Does not have a uACR on file but wants one today. Has been on statin.   Discussed treatment options. He opted for increasing Trulicity   at this time and dietary modifications. I will also send a message to [facility] to see if we could attempt Farxiga  again. Discussed he may need insulin  to achieve better BG control; he understands  Plan: - Diet changes - Increase Trulicity  to 1.5 mg weekly through [facility] - Continue Metformin  1g BID - Send Chart to [place] to review - Return in 4-6 weeks for monitoring; patient has transport limitations - bMP and uACR today        Relevant Medications   Dulaglutide  (TRULICITY ) 1.5 MG/0.5ML SOAJ     Musculoskeletal and Integument   Psoriasis   Stable. Continues to use Clobetasol  PRN        Other   Encounter for screening involving social determinants of health (SDoH)   Advised to talk with Eric to apply for [employer]       Return in about 6 weeks (around 05/11/2024) for Diabetes and blood pressure.    Hadassah Ala, MD     "

## 2024-03-30 NOTE — Assessment & Plan Note (Addendum)
 Continues to be uncontrolled. Patient was last seen in office in 05/2023. Previously on Farxiga  10 mg through assistance program, which he lost at the end of last year because the assistance program wanted him to apply to [facility] but patient is undocumented. He has been taking Metformin  1000 mg BID and Trulicity  0.75 mg weekly without side effects. Life is stressful working at the [employer] in [place], and his diet has suffered some. Home BG ~180-200 daily on his home glucose monitor; did not bring it.  He denies catabolic symptoms today. He dislikes taking medications but tries to. No BMP since 05/2023 with normal renal function. Does not have a uACR on file but wants one today. Has been on statin.   Discussed treatment options. He opted for increasing Trulicity  at this time and dietary modifications. I will also send a message to [facility] to see if we could attempt Farxiga  again. Discussed he may need insulin  to achieve better BG control; he understands  Plan: - Diet changes - Increase Trulicity  to 1.5 mg weekly through [facility] - Continue Metformin  1g BID - Send Chart to [place] to review - Return in 4-6 weeks for monitoring; patient has transport limitations - bMP and uACR today

## 2024-03-30 NOTE — Assessment & Plan Note (Signed)
 Uncontrolled but has been without medications for two days as he had to stay with children to be driven to appt. Asymptomatic. Continues on Lisinopril -hydrochlorothiazide  20-25 mg Metoprolol  Tartrate 25 mg.  Plan:  - Check BMP today - Will need follow up and advised to bring BP log and take meds before the appointment. He is not amenable for an RN only visit because transportation

## 2024-03-30 NOTE — Assessment & Plan Note (Signed)
 Stable. Continues to use Clobetasol  PRN

## 2024-03-30 NOTE — Patient Instructions (Signed)
 [name] , gracias por permitirnos ayudarle hoy. Durante esta visita hemos hablado acerca de los siguientes asuntos medicos:   Diabetes: Es importante que continue su dieta desde que el  A1c es 9.7 y esta alterado, mas que antes. Continue con la Metformina y la nueva dosis de la Trulicity  ( 1.5 mg cada semana). Le hablare a la coordinadora de el programa para el Farxiga  y engineer, civil (consulting).  - Continue monitoreando su azucar en la sangre y regrese al cuidado urgente si siente sed, mucha orina, dolores cabeza, mareado.   Presion arterial: - Esta alterada hoy. Necesita tomar los medicamentos antes de venir a event organiser. Asi sabemos que ross stores. Tambien necesita tomarse la presion en casa, para saber si necista mas ayuda.   Psoriasis - continue el medicamento topico y hidrate su piel. El cuidado del water engineer a prevenir infecciones.   Hable con Carlos para la tarjeta [place]   He ordenado los siguientes exmenes de laboratorio:   Lab Orders         Glucose, capillary         Microalbumin / Creatinine Urine Ratio         POC Hbg A1C           Cita de seguimiento: 2 months   Remember: Le llamare para decirle a cerca de los [place]. Como usted me ha dicho, puedo dejar un mensaje de voz  Para contactarnos: Esperamos verte la prxima vez. Llame a nuestra clnica al telefono: [phone] si tiene alguna pregunta o inquietud. El mejor horario para llamar es de lunes a viernes de 9 a. m. a 4 p. m. Si es fuera del horario de atencin o durante el fin de [place], llame al nmero principal del hospital y pregunte por el residente de guardia de medicina interna. Si necesita reposicin de medicamentos, por favor notifique a su farmacia con una semana de anticipacin y ellos nos enviarn una solicitud.  Gracias por confiar en nosotros para cuidar de usted!   Hadassah Kristy Ahr, MD [facility]

## 2024-03-31 ENCOUNTER — Ambulatory Visit: Payer: Self-pay | Admitting: Student

## 2024-03-31 LAB — BASIC METABOLIC PANEL WITH GFR
BUN/Creatinine Ratio: 23 (ref 10–24)
BUN: 27 mg/dL (ref 8–27)
CO2: 23 mmol/L (ref 20–29)
Calcium: 9.3 mg/dL (ref 8.6–10.2)
Chloride: 99 mmol/L (ref 96–106)
Creatinine, Ser: 1.15 mg/dL (ref 0.76–1.27)
Glucose: 186 mg/dL — ABNORMAL HIGH (ref 70–99)
Potassium: 4.9 mmol/L (ref 3.5–5.2)
Sodium: 140 mmol/L (ref 134–144)
eGFR: 72 mL/min/1.73

## 2024-03-31 LAB — MICROALBUMIN / CREATININE URINE RATIO
Creatinine, Urine: 78.4 mg/dL
Microalb/Creat Ratio: 8 mg/g{creat} (ref 0–29)
Microalbumin, Urine: 5.9 ug/mL

## 2024-04-07 ENCOUNTER — Other Ambulatory Visit: Payer: Self-pay

## 2024-04-12 ENCOUNTER — Other Ambulatory Visit: Payer: Self-pay

## 2024-04-12 NOTE — Progress Notes (Signed)
 Internal Medicine Clinic Attending  Case discussed with the resident at the time of the visit.  We reviewed the resident's history and exam and pertinent patient test results.  I agree with the assessment, diagnosis, and plan of care documented in the resident's note.

## 2024-04-18 ENCOUNTER — Other Ambulatory Visit: Payer: Self-pay | Admitting: Student

## 2024-04-18 DIAGNOSIS — I10 Essential (primary) hypertension: Secondary | ICD-10-CM

## 2024-04-19 NOTE — Telephone Encounter (Signed)
 Medication sent to pharmacy

## 2024-05-04 ENCOUNTER — Telehealth: Payer: Self-pay | Admitting: *Deleted

## 2024-05-04 ENCOUNTER — Other Ambulatory Visit: Payer: Self-pay | Admitting: Student

## 2024-05-04 DIAGNOSIS — E119 Type 2 diabetes mellitus without complications: Secondary | ICD-10-CM

## 2024-05-04 MED ORDER — TRULICITY 1.5 MG/0.5ML ~~LOC~~ SOAJ: 1.5000 mg | SUBCUTANEOUS | 3 refills | Status: DC

## 2024-05-04 NOTE — Telephone Encounter (Signed)
 Will forward to PCP for dosing.          Copied from CRM #[id number]. Topic: Clinical - Medication Refill >> May 03, 2024  9:56 AM Vivian Z wrote: Medication: Dulaglutide  (TRULICITY ) 1.5 MG/0.5ML SOAJ  Has the patient contacted their pharmacy? Yes (Agent: If no, request that the patient contact the pharmacy for the refill. If patient does not wish to contact the pharmacy document the reason why and proceed with request.) (Agent: If yes, when and what did the pharmacy advise?)  This is the patient's preferred pharmacy:  [facility] - [facility] [address] [place] [address] Phone: [phone] Fax: [phone]  Is this the correct pharmacy for this prescription? Yes If no, delete pharmacy and type the correct one.   Has the prescription been filled recently? No  Is the patient out of the medication? No  Has the patient been seen for an appointment in the last year OR does the patient have an upcoming appointment? Yes  Can we respond through MyChart? No  Agent: Please be advised that Rx refills may take up to 3 business days. We ask that you follow-up with your pharmacy.

## 2024-05-06 ENCOUNTER — Other Ambulatory Visit (HOSPITAL_COMMUNITY): Payer: Self-pay

## 2024-05-06 ENCOUNTER — Other Ambulatory Visit: Payer: Self-pay

## 2024-05-06 MED ORDER — TRULICITY 1.5 MG/0.5ML ~~LOC~~ SOAJ
1.5000 mg | SUBCUTANEOUS | 3 refills | Status: AC
  Filled 2024-05-06 (×2): qty 2, 28d supply, fill #0

## 2024-05-06 NOTE — Telephone Encounter (Signed)
 Copied from CRM #[id number]. Topic: Clinical - Prescription Issue >> May 06, 2024  8:53 AM Diannia H wrote: Reason for CRM: Patient called because his rx for Dulaglutide  (TRULICITY ) 1.5 MG/0.5ML SOAJ was sent to [facility] and he wanted it to be sent to [facility] [address] [place] [address] Phone: [phone] Fax: [phone] Hours: M-F 7:30am-7:00p  Could you assist and call the patient back at [phone].

## 2024-05-07 ENCOUNTER — Other Ambulatory Visit: Payer: Self-pay

## 2024-05-11 ENCOUNTER — Ambulatory Visit: Payer: Self-pay | Admitting: Student

## 2024-05-18 ENCOUNTER — Ambulatory Visit: Payer: Self-pay | Admitting: Student
